# Patient Record
Sex: Female | Born: 1980 | Race: White | Hispanic: No | Marital: Single | Smoking: Current every day smoker
Health system: Southern US, Community
[De-identification: ages and names within clinical notes are randomized; demographics above are authoritative.]

## PROBLEM LIST (undated history)

## (undated) DIAGNOSIS — F319 Bipolar disorder, unspecified: Secondary | ICD-10-CM

## (undated) DIAGNOSIS — R569 Unspecified convulsions: Secondary | ICD-10-CM

## (undated) DIAGNOSIS — K5792 Diverticulitis of intestine, part unspecified, without perforation or abscess without bleeding: Secondary | ICD-10-CM

## (undated) DIAGNOSIS — M549 Dorsalgia, unspecified: Secondary | ICD-10-CM

## (undated) DIAGNOSIS — M5430 Sciatica, unspecified side: Secondary | ICD-10-CM

## (undated) DIAGNOSIS — K859 Acute pancreatitis without necrosis or infection, unspecified: Secondary | ICD-10-CM

## (undated) DIAGNOSIS — N289 Disorder of kidney and ureter, unspecified: Secondary | ICD-10-CM

## (undated) DIAGNOSIS — G8929 Other chronic pain: Secondary | ICD-10-CM

## (undated) DIAGNOSIS — D649 Anemia, unspecified: Secondary | ICD-10-CM

## (undated) DIAGNOSIS — F1910 Other psychoactive substance abuse, uncomplicated: Secondary | ICD-10-CM

## (undated) HISTORY — PX: ABDOMINAL HYSTERECTOMY: SHX81

## (undated) HISTORY — PX: ABDOMINAL SURGERY: SHX537

## (undated) HISTORY — PX: DILATION AND CURETTAGE OF UTERUS: SHX78

## (undated) HISTORY — PX: CHOLECYSTECTOMY: SHX55

## (undated) HISTORY — PX: MANDIBLE SURGERY: SHX707

---
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
## 2002-04-21 ENCOUNTER — Ambulatory Visit (HOSPITAL_COMMUNITY): Admission: RE | Admit: 2002-04-21 | Discharge: 2002-04-21 | Payer: Self-pay | Admitting: *Deleted

## 2002-05-21 ENCOUNTER — Emergency Department (HOSPITAL_COMMUNITY): Admission: EM | Admit: 2002-05-21 | Discharge: 2002-05-21 | Payer: Self-pay | Admitting: *Deleted

## 2002-05-21 ENCOUNTER — Encounter: Payer: Self-pay | Admitting: *Deleted

## 2002-05-31 ENCOUNTER — Emergency Department (HOSPITAL_COMMUNITY): Admission: EM | Admit: 2002-05-31 | Discharge: 2002-05-31 | Payer: Self-pay | Admitting: *Deleted

## 2002-05-31 ENCOUNTER — Encounter: Payer: Self-pay | Admitting: *Deleted

## 2002-06-19 ENCOUNTER — Emergency Department (HOSPITAL_COMMUNITY): Admission: EM | Admit: 2002-06-19 | Discharge: 2002-06-20 | Payer: Self-pay | Admitting: *Deleted

## 2002-06-28 ENCOUNTER — Encounter: Payer: Self-pay | Admitting: Emergency Medicine

## 2002-06-28 ENCOUNTER — Emergency Department (HOSPITAL_COMMUNITY): Admission: EM | Admit: 2002-06-28 | Discharge: 2002-06-28 | Payer: Self-pay | Admitting: Emergency Medicine

## 2002-07-24 ENCOUNTER — Emergency Department (HOSPITAL_COMMUNITY): Admission: EM | Admit: 2002-07-24 | Discharge: 2002-07-24 | Payer: Self-pay | Admitting: *Deleted

## 2002-08-02 ENCOUNTER — Ambulatory Visit (HOSPITAL_COMMUNITY): Admission: RE | Admit: 2002-08-02 | Discharge: 2002-08-02 | Payer: Self-pay | Admitting: *Deleted

## 2002-08-02 ENCOUNTER — Encounter: Payer: Self-pay | Admitting: *Deleted

## 2002-08-12 ENCOUNTER — Emergency Department (HOSPITAL_COMMUNITY): Admission: EM | Admit: 2002-08-12 | Discharge: 2002-08-12 | Payer: Self-pay | Admitting: Emergency Medicine

## 2002-08-16 ENCOUNTER — Inpatient Hospital Stay (HOSPITAL_COMMUNITY): Admission: AD | Admit: 2002-08-16 | Discharge: 2002-08-17 | Payer: Self-pay | Admitting: *Deleted

## 2002-11-19 ENCOUNTER — Emergency Department (HOSPITAL_COMMUNITY): Admission: EM | Admit: 2002-11-19 | Discharge: 2002-11-19 | Payer: Self-pay | Admitting: *Deleted

## 2003-01-12 ENCOUNTER — Emergency Department (HOSPITAL_COMMUNITY): Admission: EM | Admit: 2003-01-12 | Discharge: 2003-01-12 | Payer: Self-pay | Admitting: Emergency Medicine

## 2003-01-18 ENCOUNTER — Emergency Department (HOSPITAL_COMMUNITY): Admission: EM | Admit: 2003-01-18 | Discharge: 2003-01-18 | Payer: Self-pay | Admitting: Emergency Medicine

## 2003-01-18 ENCOUNTER — Encounter: Payer: Self-pay | Admitting: Emergency Medicine

## 2003-02-14 ENCOUNTER — Emergency Department (HOSPITAL_COMMUNITY): Admission: EM | Admit: 2003-02-14 | Discharge: 2003-02-14 | Payer: Self-pay | Admitting: Emergency Medicine

## 2003-04-26 ENCOUNTER — Ambulatory Visit (HOSPITAL_COMMUNITY): Admission: AD | Admit: 2003-04-26 | Discharge: 2003-04-27 | Payer: Self-pay | Admitting: Obstetrics and Gynecology

## 2003-05-05 ENCOUNTER — Ambulatory Visit (HOSPITAL_COMMUNITY): Admission: AD | Admit: 2003-05-05 | Discharge: 2003-05-05 | Payer: Self-pay | Admitting: Obstetrics and Gynecology

## 2003-07-16 ENCOUNTER — Ambulatory Visit (HOSPITAL_COMMUNITY): Admission: AD | Admit: 2003-07-16 | Discharge: 2003-07-16 | Payer: Self-pay | Admitting: Obstetrics and Gynecology

## 2003-08-21 ENCOUNTER — Ambulatory Visit (HOSPITAL_COMMUNITY): Admission: AD | Admit: 2003-08-21 | Discharge: 2003-08-21 | Payer: Self-pay | Admitting: Obstetrics and Gynecology

## 2003-08-28 ENCOUNTER — Ambulatory Visit (HOSPITAL_COMMUNITY): Admission: AD | Admit: 2003-08-28 | Discharge: 2003-08-28 | Payer: Self-pay | Admitting: Obstetrics and Gynecology

## 2003-09-03 ENCOUNTER — Inpatient Hospital Stay (HOSPITAL_COMMUNITY): Admission: RE | Admit: 2003-09-03 | Discharge: 2003-09-05 | Payer: Self-pay | Admitting: Obstetrics & Gynecology

## 2003-11-01 ENCOUNTER — Ambulatory Visit (HOSPITAL_COMMUNITY): Admission: RE | Admit: 2003-11-01 | Discharge: 2003-11-01 | Payer: Self-pay | Admitting: General Surgery

## 2004-02-06 ENCOUNTER — Other Ambulatory Visit: Admission: RE | Admit: 2004-02-06 | Discharge: 2004-02-06 | Payer: Self-pay | Admitting: Obstetrics and Gynecology

## 2004-03-27 ENCOUNTER — Emergency Department (HOSPITAL_COMMUNITY): Admission: EM | Admit: 2004-03-27 | Discharge: 2004-03-27 | Payer: Self-pay | Admitting: *Deleted

## 2004-03-30 ENCOUNTER — Emergency Department (HOSPITAL_COMMUNITY): Admission: EM | Admit: 2004-03-30 | Discharge: 2004-03-30 | Payer: Self-pay | Admitting: Emergency Medicine

## 2004-04-05 ENCOUNTER — Emergency Department (HOSPITAL_COMMUNITY): Admission: EM | Admit: 2004-04-05 | Discharge: 2004-04-05 | Payer: Self-pay | Admitting: Emergency Medicine

## 2004-04-22 ENCOUNTER — Encounter (HOSPITAL_COMMUNITY): Admission: RE | Admit: 2004-04-22 | Discharge: 2004-05-22 | Payer: Self-pay | Admitting: Obstetrics and Gynecology

## 2004-06-02 ENCOUNTER — Emergency Department (HOSPITAL_COMMUNITY): Admission: EM | Admit: 2004-06-02 | Discharge: 2004-06-02 | Payer: Self-pay | Admitting: Emergency Medicine

## 2004-06-04 ENCOUNTER — Emergency Department (HOSPITAL_COMMUNITY): Admission: EM | Admit: 2004-06-04 | Discharge: 2004-06-05 | Payer: Self-pay | Admitting: *Deleted

## 2004-06-04 ENCOUNTER — Emergency Department (HOSPITAL_COMMUNITY): Admission: EM | Admit: 2004-06-04 | Discharge: 2004-06-04 | Payer: Self-pay | Admitting: Emergency Medicine

## 2004-06-07 ENCOUNTER — Emergency Department (HOSPITAL_COMMUNITY): Admission: EM | Admit: 2004-06-07 | Discharge: 2004-06-07 | Payer: Self-pay | Admitting: Emergency Medicine

## 2004-06-09 ENCOUNTER — Emergency Department (HOSPITAL_COMMUNITY): Admission: EM | Admit: 2004-06-09 | Discharge: 2004-06-10 | Payer: Self-pay | Admitting: Emergency Medicine

## 2004-06-14 ENCOUNTER — Emergency Department (HOSPITAL_COMMUNITY): Admission: EM | Admit: 2004-06-14 | Discharge: 2004-06-14 | Payer: Self-pay | Admitting: Emergency Medicine

## 2004-07-20 ENCOUNTER — Emergency Department (HOSPITAL_COMMUNITY): Admission: EM | Admit: 2004-07-20 | Discharge: 2004-07-20 | Payer: Self-pay | Admitting: Emergency Medicine

## 2004-07-24 ENCOUNTER — Emergency Department (HOSPITAL_COMMUNITY): Admission: EM | Admit: 2004-07-24 | Discharge: 2004-07-24 | Payer: Self-pay | Admitting: Emergency Medicine

## 2004-08-25 ENCOUNTER — Emergency Department (HOSPITAL_COMMUNITY): Admission: EM | Admit: 2004-08-25 | Discharge: 2004-08-25 | Payer: Self-pay | Admitting: Emergency Medicine

## 2004-10-03 ENCOUNTER — Emergency Department (HOSPITAL_COMMUNITY): Admission: EM | Admit: 2004-10-03 | Discharge: 2004-10-03 | Payer: Self-pay | Admitting: Emergency Medicine

## 2005-01-14 ENCOUNTER — Emergency Department (HOSPITAL_COMMUNITY): Admission: EM | Admit: 2005-01-14 | Discharge: 2005-01-14 | Payer: Self-pay | Admitting: Emergency Medicine

## 2005-01-20 ENCOUNTER — Emergency Department (HOSPITAL_COMMUNITY): Admission: EM | Admit: 2005-01-20 | Discharge: 2005-01-20 | Payer: Self-pay | Admitting: Emergency Medicine

## 2005-02-17 ENCOUNTER — Emergency Department (HOSPITAL_COMMUNITY): Admission: EM | Admit: 2005-02-17 | Discharge: 2005-02-17 | Payer: Self-pay | Admitting: Emergency Medicine

## 2005-04-16 ENCOUNTER — Emergency Department (HOSPITAL_COMMUNITY): Admission: EM | Admit: 2005-04-16 | Discharge: 2005-04-17 | Payer: Self-pay | Admitting: Emergency Medicine

## 2005-06-04 ENCOUNTER — Emergency Department (HOSPITAL_COMMUNITY): Admission: EM | Admit: 2005-06-04 | Discharge: 2005-06-04 | Payer: Self-pay | Admitting: Emergency Medicine

## 2005-10-26 ENCOUNTER — Emergency Department (HOSPITAL_COMMUNITY): Admission: EM | Admit: 2005-10-26 | Discharge: 2005-10-26 | Payer: Self-pay | Admitting: Emergency Medicine

## 2005-10-26 ENCOUNTER — Emergency Department (HOSPITAL_COMMUNITY): Admission: AD | Admit: 2005-10-26 | Discharge: 2005-10-26 | Payer: Self-pay | Admitting: Emergency Medicine

## 2005-11-08 ENCOUNTER — Emergency Department (HOSPITAL_COMMUNITY): Admission: EM | Admit: 2005-11-08 | Discharge: 2005-11-08 | Payer: Self-pay | Admitting: Emergency Medicine

## 2005-11-11 ENCOUNTER — Emergency Department (HOSPITAL_COMMUNITY): Admission: EM | Admit: 2005-11-11 | Discharge: 2005-11-11 | Payer: Self-pay | Admitting: Emergency Medicine

## 2005-11-16 ENCOUNTER — Emergency Department (HOSPITAL_COMMUNITY): Admission: EM | Admit: 2005-11-16 | Discharge: 2005-11-17 | Payer: Self-pay | Admitting: Emergency Medicine

## 2005-11-30 ENCOUNTER — Emergency Department (HOSPITAL_COMMUNITY): Admission: EM | Admit: 2005-11-30 | Discharge: 2005-12-01 | Payer: Self-pay | Admitting: Emergency Medicine

## 2006-02-21 ENCOUNTER — Inpatient Hospital Stay (HOSPITAL_COMMUNITY): Admission: RE | Admit: 2006-02-21 | Discharge: 2006-02-21 | Payer: Self-pay | Admitting: Obstetrics and Gynecology

## 2006-03-10 ENCOUNTER — Ambulatory Visit (HOSPITAL_COMMUNITY): Admission: AD | Admit: 2006-03-10 | Discharge: 2006-03-10 | Payer: Self-pay | Admitting: Obstetrics and Gynecology

## 2006-04-11 ENCOUNTER — Ambulatory Visit (HOSPITAL_COMMUNITY): Admission: RE | Admit: 2006-04-11 | Discharge: 2006-04-12 | Payer: Self-pay | Admitting: Obstetrics and Gynecology

## 2006-05-19 ENCOUNTER — Ambulatory Visit (HOSPITAL_COMMUNITY): Admission: RE | Admit: 2006-05-19 | Discharge: 2006-05-19 | Payer: Self-pay | Admitting: Obstetrics and Gynecology

## 2006-06-22 ENCOUNTER — Ambulatory Visit (HOSPITAL_COMMUNITY): Admission: AD | Admit: 2006-06-22 | Discharge: 2006-06-22 | Payer: Self-pay | Admitting: Obstetrics and Gynecology

## 2006-07-11 ENCOUNTER — Inpatient Hospital Stay (HOSPITAL_COMMUNITY): Admission: AD | Admit: 2006-07-11 | Discharge: 2006-07-14 | Payer: Self-pay | Admitting: Obstetrics & Gynecology

## 2006-07-17 ENCOUNTER — Inpatient Hospital Stay (HOSPITAL_COMMUNITY): Admission: EM | Admit: 2006-07-17 | Discharge: 2006-07-19 | Payer: Self-pay | Admitting: Emergency Medicine

## 2006-07-27 ENCOUNTER — Emergency Department (HOSPITAL_COMMUNITY): Admission: EM | Admit: 2006-07-27 | Discharge: 2006-07-27 | Payer: Self-pay | Admitting: Emergency Medicine

## 2006-08-06 ENCOUNTER — Ambulatory Visit (HOSPITAL_COMMUNITY): Admission: RE | Admit: 2006-08-06 | Discharge: 2006-08-06 | Payer: Self-pay | Admitting: Obstetrics & Gynecology

## 2006-08-10 ENCOUNTER — Observation Stay (HOSPITAL_COMMUNITY): Admission: RE | Admit: 2006-08-10 | Discharge: 2006-08-11 | Payer: Self-pay | Admitting: General Surgery

## 2006-08-10 ENCOUNTER — Encounter (INDEPENDENT_AMBULATORY_CARE_PROVIDER_SITE_OTHER): Payer: Self-pay | Admitting: *Deleted

## 2006-09-03 ENCOUNTER — Ambulatory Visit (HOSPITAL_COMMUNITY): Admission: RE | Admit: 2006-09-03 | Discharge: 2006-09-03 | Payer: Self-pay | Admitting: Obstetrics and Gynecology

## 2006-09-29 ENCOUNTER — Emergency Department (HOSPITAL_COMMUNITY): Admission: EM | Admit: 2006-09-29 | Discharge: 2006-09-30 | Payer: Self-pay | Admitting: Emergency Medicine

## 2006-10-21 ENCOUNTER — Emergency Department (HOSPITAL_COMMUNITY): Admission: EM | Admit: 2006-10-21 | Discharge: 2006-10-21 | Payer: Self-pay | Admitting: Emergency Medicine

## 2006-10-30 ENCOUNTER — Emergency Department (HOSPITAL_COMMUNITY): Admission: EM | Admit: 2006-10-30 | Discharge: 2006-10-30 | Payer: Self-pay | Admitting: Emergency Medicine

## 2006-12-03 ENCOUNTER — Emergency Department (HOSPITAL_COMMUNITY): Admission: EM | Admit: 2006-12-03 | Discharge: 2006-12-03 | Payer: Self-pay | Admitting: Emergency Medicine

## 2006-12-03 ENCOUNTER — Encounter: Payer: Self-pay | Admitting: Obstetrics and Gynecology

## 2007-05-24 ENCOUNTER — Emergency Department (HOSPITAL_COMMUNITY): Admission: EM | Admit: 2007-05-24 | Discharge: 2007-05-24 | Payer: Self-pay | Admitting: Emergency Medicine

## 2007-06-16 ENCOUNTER — Emergency Department (HOSPITAL_COMMUNITY): Admission: EM | Admit: 2007-06-16 | Discharge: 2007-06-16 | Payer: Self-pay | Admitting: Emergency Medicine

## 2007-06-26 ENCOUNTER — Emergency Department (HOSPITAL_COMMUNITY): Admission: EM | Admit: 2007-06-26 | Discharge: 2007-06-26 | Payer: Self-pay | Admitting: Emergency Medicine

## 2007-06-28 ENCOUNTER — Emergency Department (HOSPITAL_COMMUNITY): Admission: EM | Admit: 2007-06-28 | Discharge: 2007-06-28 | Payer: Self-pay | Admitting: Emergency Medicine

## 2007-07-01 ENCOUNTER — Emergency Department (HOSPITAL_COMMUNITY): Admission: EM | Admit: 2007-07-01 | Discharge: 2007-07-02 | Payer: Self-pay | Admitting: Emergency Medicine

## 2007-07-12 ENCOUNTER — Inpatient Hospital Stay (HOSPITAL_COMMUNITY): Admission: RE | Admit: 2007-07-12 | Discharge: 2007-07-13 | Payer: Self-pay | Admitting: Obstetrics and Gynecology

## 2007-07-12 ENCOUNTER — Encounter: Payer: Self-pay | Admitting: Obstetrics and Gynecology

## 2007-07-16 ENCOUNTER — Emergency Department (HOSPITAL_COMMUNITY): Admission: EM | Admit: 2007-07-16 | Discharge: 2007-07-16 | Payer: Self-pay | Admitting: Emergency Medicine

## 2007-07-17 ENCOUNTER — Inpatient Hospital Stay (HOSPITAL_COMMUNITY): Admission: AD | Admit: 2007-07-17 | Discharge: 2007-07-17 | Payer: Self-pay | Admitting: Obstetrics & Gynecology

## 2007-07-17 ENCOUNTER — Ambulatory Visit: Payer: Self-pay | Admitting: Obstetrics & Gynecology

## 2007-07-20 ENCOUNTER — Emergency Department (HOSPITAL_COMMUNITY): Admission: EM | Admit: 2007-07-20 | Discharge: 2007-07-20 | Payer: Self-pay | Admitting: Emergency Medicine

## 2007-08-09 ENCOUNTER — Emergency Department (HOSPITAL_COMMUNITY): Admission: EM | Admit: 2007-08-09 | Discharge: 2007-08-10 | Payer: Self-pay | Admitting: Emergency Medicine

## 2007-08-18 ENCOUNTER — Emergency Department (HOSPITAL_COMMUNITY): Admission: EM | Admit: 2007-08-18 | Discharge: 2007-08-19 | Payer: Self-pay | Admitting: Emergency Medicine

## 2007-08-21 ENCOUNTER — Emergency Department (HOSPITAL_COMMUNITY): Admission: EM | Admit: 2007-08-21 | Discharge: 2007-08-21 | Payer: Self-pay | Admitting: Emergency Medicine

## 2007-09-22 ENCOUNTER — Emergency Department (HOSPITAL_COMMUNITY): Admission: EM | Admit: 2007-09-22 | Discharge: 2007-09-22 | Payer: Self-pay | Admitting: Emergency Medicine

## 2007-11-14 ENCOUNTER — Emergency Department (HOSPITAL_COMMUNITY): Admission: EM | Admit: 2007-11-14 | Discharge: 2007-11-14 | Payer: Self-pay | Admitting: Emergency Medicine

## 2007-12-31 ENCOUNTER — Emergency Department (HOSPITAL_COMMUNITY): Admission: EM | Admit: 2007-12-31 | Discharge: 2007-12-31 | Payer: Self-pay | Admitting: Emergency Medicine

## 2008-01-19 ENCOUNTER — Emergency Department (HOSPITAL_COMMUNITY): Admission: EM | Admit: 2008-01-19 | Discharge: 2008-01-19 | Payer: Self-pay | Admitting: Emergency Medicine

## 2008-01-25 ENCOUNTER — Emergency Department (HOSPITAL_COMMUNITY): Admission: EM | Admit: 2008-01-25 | Discharge: 2008-01-26 | Payer: Self-pay | Admitting: Emergency Medicine

## 2008-02-20 ENCOUNTER — Emergency Department (HOSPITAL_COMMUNITY): Admission: EM | Admit: 2008-02-20 | Discharge: 2008-02-21 | Payer: Self-pay | Admitting: Emergency Medicine

## 2008-04-22 ENCOUNTER — Emergency Department (HOSPITAL_COMMUNITY): Admission: EM | Admit: 2008-04-22 | Discharge: 2008-04-22 | Payer: Self-pay | Admitting: Emergency Medicine

## 2008-05-15 ENCOUNTER — Emergency Department (HOSPITAL_COMMUNITY): Admission: EM | Admit: 2008-05-15 | Discharge: 2008-05-15 | Payer: Self-pay | Admitting: Family Medicine

## 2008-05-31 ENCOUNTER — Emergency Department (HOSPITAL_COMMUNITY): Admission: EM | Admit: 2008-05-31 | Discharge: 2008-05-31 | Payer: Self-pay | Admitting: Emergency Medicine

## 2008-06-09 ENCOUNTER — Emergency Department (HOSPITAL_COMMUNITY): Admission: EM | Admit: 2008-06-09 | Discharge: 2008-06-09 | Payer: Self-pay | Admitting: Emergency Medicine

## 2008-07-11 ENCOUNTER — Emergency Department (HOSPITAL_COMMUNITY): Admission: EM | Admit: 2008-07-11 | Discharge: 2008-07-11 | Payer: Self-pay | Admitting: Emergency Medicine

## 2008-09-11 ENCOUNTER — Emergency Department (HOSPITAL_COMMUNITY): Admission: EM | Admit: 2008-09-11 | Discharge: 2008-09-12 | Payer: Self-pay | Admitting: Emergency Medicine

## 2008-11-29 ENCOUNTER — Emergency Department (HOSPITAL_COMMUNITY): Admission: EM | Admit: 2008-11-29 | Discharge: 2008-11-29 | Payer: Self-pay | Admitting: Emergency Medicine

## 2008-12-02 ENCOUNTER — Emergency Department (HOSPITAL_COMMUNITY): Admission: EM | Admit: 2008-12-02 | Discharge: 2008-12-02 | Payer: Self-pay | Admitting: Emergency Medicine

## 2009-01-03 ENCOUNTER — Emergency Department (HOSPITAL_COMMUNITY): Admission: EM | Admit: 2009-01-03 | Discharge: 2009-01-03 | Payer: Self-pay | Admitting: Emergency Medicine

## 2009-01-05 ENCOUNTER — Emergency Department (HOSPITAL_COMMUNITY): Admission: EM | Admit: 2009-01-05 | Discharge: 2009-01-05 | Payer: Self-pay | Admitting: Emergency Medicine

## 2009-01-08 ENCOUNTER — Inpatient Hospital Stay (HOSPITAL_COMMUNITY): Admission: EM | Admit: 2009-01-08 | Discharge: 2009-01-10 | Payer: Self-pay | Admitting: Emergency Medicine

## 2009-01-09 ENCOUNTER — Ambulatory Visit: Payer: Self-pay | Admitting: Gastroenterology

## 2009-01-11 ENCOUNTER — Telehealth (INDEPENDENT_AMBULATORY_CARE_PROVIDER_SITE_OTHER): Payer: Self-pay | Admitting: *Deleted

## 2009-01-15 ENCOUNTER — Encounter: Payer: Self-pay | Admitting: Gastroenterology

## 2009-01-15 ENCOUNTER — Emergency Department (HOSPITAL_COMMUNITY): Admission: EM | Admit: 2009-01-15 | Discharge: 2009-01-15 | Payer: Self-pay | Admitting: Emergency Medicine

## 2009-01-25 ENCOUNTER — Emergency Department (HOSPITAL_COMMUNITY): Admission: EM | Admit: 2009-01-25 | Discharge: 2009-01-25 | Payer: Self-pay | Admitting: Emergency Medicine

## 2009-01-27 ENCOUNTER — Emergency Department (HOSPITAL_COMMUNITY): Admission: EM | Admit: 2009-01-27 | Discharge: 2009-01-28 | Payer: Self-pay | Admitting: Emergency Medicine

## 2009-01-27 ENCOUNTER — Telehealth: Payer: Self-pay | Admitting: Gastroenterology

## 2009-01-30 ENCOUNTER — Emergency Department (HOSPITAL_COMMUNITY): Admission: EM | Admit: 2009-01-30 | Discharge: 2009-01-30 | Payer: Self-pay | Admitting: Emergency Medicine

## 2009-03-22 ENCOUNTER — Emergency Department (HOSPITAL_COMMUNITY): Admission: EM | Admit: 2009-03-22 | Discharge: 2009-03-22 | Payer: Self-pay | Admitting: Emergency Medicine

## 2009-03-26 ENCOUNTER — Emergency Department (HOSPITAL_COMMUNITY): Admission: EM | Admit: 2009-03-26 | Discharge: 2009-03-26 | Payer: Self-pay | Admitting: Emergency Medicine

## 2009-07-11 ENCOUNTER — Emergency Department (HOSPITAL_COMMUNITY): Admission: EM | Admit: 2009-07-11 | Discharge: 2009-07-11 | Payer: Self-pay | Admitting: Emergency Medicine

## 2009-07-18 ENCOUNTER — Emergency Department (HOSPITAL_COMMUNITY): Admission: EM | Admit: 2009-07-18 | Discharge: 2009-07-18 | Payer: Self-pay | Admitting: Emergency Medicine

## 2009-08-10 ENCOUNTER — Emergency Department (HOSPITAL_COMMUNITY): Admission: EM | Admit: 2009-08-10 | Discharge: 2009-08-10 | Payer: Self-pay | Admitting: Emergency Medicine

## 2009-08-22 ENCOUNTER — Emergency Department (HOSPITAL_COMMUNITY): Admission: EM | Admit: 2009-08-22 | Discharge: 2009-08-22 | Payer: Self-pay | Admitting: Emergency Medicine

## 2009-08-23 ENCOUNTER — Emergency Department (HOSPITAL_COMMUNITY): Admission: EM | Admit: 2009-08-23 | Discharge: 2009-08-23 | Payer: Self-pay | Admitting: Emergency Medicine

## 2009-08-26 ENCOUNTER — Emergency Department (HOSPITAL_COMMUNITY): Admission: EM | Admit: 2009-08-26 | Discharge: 2009-08-27 | Payer: Self-pay | Admitting: Emergency Medicine

## 2009-08-28 ENCOUNTER — Emergency Department (HOSPITAL_COMMUNITY): Admission: EM | Admit: 2009-08-28 | Discharge: 2009-08-28 | Payer: Self-pay | Admitting: Emergency Medicine

## 2009-08-31 ENCOUNTER — Emergency Department (HOSPITAL_COMMUNITY): Admission: EM | Admit: 2009-08-31 | Discharge: 2009-09-01 | Payer: Self-pay | Admitting: Emergency Medicine

## 2009-09-04 ENCOUNTER — Encounter (INDEPENDENT_AMBULATORY_CARE_PROVIDER_SITE_OTHER): Payer: Self-pay | Admitting: *Deleted

## 2009-09-09 ENCOUNTER — Emergency Department (HOSPITAL_COMMUNITY): Admission: EM | Admit: 2009-09-09 | Discharge: 2009-09-09 | Payer: Self-pay | Admitting: Emergency Medicine

## 2009-09-11 ENCOUNTER — Emergency Department (HOSPITAL_COMMUNITY): Admission: EM | Admit: 2009-09-11 | Discharge: 2009-09-11 | Payer: Self-pay | Admitting: Emergency Medicine

## 2009-09-17 ENCOUNTER — Emergency Department (HOSPITAL_COMMUNITY): Admission: EM | Admit: 2009-09-17 | Discharge: 2009-09-17 | Payer: Self-pay | Admitting: Emergency Medicine

## 2009-09-22 ENCOUNTER — Emergency Department (HOSPITAL_COMMUNITY): Admission: EM | Admit: 2009-09-22 | Discharge: 2009-09-22 | Payer: Self-pay | Admitting: Emergency Medicine

## 2009-09-23 ENCOUNTER — Emergency Department (HOSPITAL_COMMUNITY): Admission: EM | Admit: 2009-09-23 | Discharge: 2009-09-23 | Payer: Self-pay | Admitting: Emergency Medicine

## 2009-09-24 ENCOUNTER — Encounter (INDEPENDENT_AMBULATORY_CARE_PROVIDER_SITE_OTHER): Payer: Self-pay | Admitting: *Deleted

## 2009-09-27 ENCOUNTER — Emergency Department (HOSPITAL_COMMUNITY): Admission: EM | Admit: 2009-09-27 | Discharge: 2009-09-27 | Payer: Self-pay | Admitting: Emergency Medicine

## 2009-09-28 ENCOUNTER — Ambulatory Visit: Payer: Self-pay | Admitting: Gastroenterology

## 2009-09-28 ENCOUNTER — Encounter (INDEPENDENT_AMBULATORY_CARE_PROVIDER_SITE_OTHER): Payer: Self-pay | Admitting: *Deleted

## 2009-09-28 ENCOUNTER — Emergency Department (HOSPITAL_COMMUNITY): Admission: EM | Admit: 2009-09-28 | Discharge: 2009-09-28 | Payer: Self-pay | Admitting: Emergency Medicine

## 2009-09-28 DIAGNOSIS — R109 Unspecified abdominal pain: Secondary | ICD-10-CM | POA: Insufficient documentation

## 2009-09-28 DIAGNOSIS — K5900 Constipation, unspecified: Secondary | ICD-10-CM | POA: Insufficient documentation

## 2009-09-28 DIAGNOSIS — IMO0002 Reserved for concepts with insufficient information to code with codable children: Secondary | ICD-10-CM

## 2009-09-28 DIAGNOSIS — R1110 Vomiting, unspecified: Secondary | ICD-10-CM

## 2009-09-30 ENCOUNTER — Observation Stay (HOSPITAL_COMMUNITY): Admission: EM | Admit: 2009-09-30 | Discharge: 2009-10-03 | Payer: Self-pay | Admitting: Emergency Medicine

## 2009-10-31 ENCOUNTER — Encounter (INDEPENDENT_AMBULATORY_CARE_PROVIDER_SITE_OTHER): Payer: Self-pay | Admitting: *Deleted

## 2009-11-07 ENCOUNTER — Emergency Department (HOSPITAL_COMMUNITY): Admission: EM | Admit: 2009-11-07 | Discharge: 2009-11-07 | Payer: Self-pay | Admitting: Emergency Medicine

## 2010-01-27 ENCOUNTER — Emergency Department (HOSPITAL_COMMUNITY): Admission: EM | Admit: 2010-01-27 | Discharge: 2010-01-27 | Payer: Self-pay | Admitting: Emergency Medicine

## 2010-02-15 ENCOUNTER — Emergency Department (HOSPITAL_COMMUNITY): Admission: EM | Admit: 2010-02-15 | Discharge: 2010-02-15 | Payer: Self-pay | Admitting: Emergency Medicine

## 2010-05-20 ENCOUNTER — Encounter: Payer: Self-pay | Admitting: Emergency Medicine

## 2010-05-20 ENCOUNTER — Inpatient Hospital Stay (HOSPITAL_COMMUNITY): Admission: EM | Admit: 2010-05-20 | Discharge: 2010-05-23 | Payer: Self-pay | Admitting: Internal Medicine

## 2010-07-21 ENCOUNTER — Encounter: Payer: Self-pay | Admitting: General Surgery

## 2010-07-21 ENCOUNTER — Encounter: Payer: Self-pay | Admitting: Gastroenterology

## 2010-07-29 NOTE — H&P (Signed)
[name], [name]             ACCOUNT NO.:  [id number]  MEDICAL RECORD NO.:  [id number]          PATIENT TYPE:  EMS  LOCATION:  ED                            FACILITY:  [facility]  PHYSICIAN:  Veronica Nephew, MD       DATE OF BIRTH:  May 20, 1981  DATE OF ADMISSION:  05/20/2010 DATE OF DISCHARGE:  LH                             HISTORY & PHYSICAL   PRIMARY CARE PHYSICIAN:  [facility].  CHIEF COMPLAINT:  Right hand abscess.  HISTORY OF PRESENT ILLNESS:  A 30 year old female with a history of bipolar disorder, a history of cellulitis, history of cervical cancer status post hysterectomy and oophorectomy, history of kidney stones, history of seizure disorder, history of dental abscess, history of mild asthma, comes in with a chief complaint of right hand abscess.  The patient reports that she has been having fevers and the abscess that has been slowly getting bigger for the past 4-5 days.  The patient reports that her highest fever has been 102.5.  The patient reports that she is having nausea and vomiting and also diarrhea at times.  The patient reports that the right hand area is warm, tender and has induration. The patient has been doing skin-popping of IV cocaine.  The patient has had history of IV drug use and has a history of cellulitis.  The patient has a PENICILLIN allergy.  The patient has been treated with clindamycin previously as well as ciprofloxacin.  The patient in the ED received VANCOMYCIN and she developed pruritus, so VANCOMYCIN is a possible allergy.  The patient receives Rocephin in the ED without any difficulty.  I spoke to Dr. Mina [name] (hand surgery), phone number [phone], who has accepted to see the patient in consultation at [facility].  PAST MEDICAL HISTORY: 1. History bipolar disorder. 2. History of cellulitis. 3. History of cervical cancer, status post hysterectomy and     oophorectomy, total hysterectomy, cervix taken out. 4.  History of kidney stones. 5. History of seizure disorder. 6. History of dental abscess. 7. History of mild asthma.  SURGICAL HISTORY: 1. History of cholecystectomy. 2. History of hysterectomy. 3. History of oophorectomy.  SOCIAL HISTORY:  Nondrinker.  The patient is a smoker.  The patient uses IV cocaine.  She does not use any other drugs per the patient.  She lives with her fiance.  The patient has an allergies to FLAGYL, hives; allergies to PENICILLIN, she reports she gets swollen; NSAIDs and a possible allergy to it looks like VANCOMYCIN as the patient developed pruritus from [facility].  HOME MEDICATIONS: 1. Neurontin 300 mg p.o. b.i.d. 2. She also takes Tegretol 300 mg p.o. b.i.d. 3. She takes an albuterol inhaler as needed.  REVIEW OF SYSTEMS:  She denies any headaches or any blurry vision.  She denies any chest pain or any shortness of breath.  She is having some nausea and vomiting.  She denies any belly pain.  She denies any burning on urination.  She reports that she is having some diarrhea.  She denies any pain in her legs.  She is alert, awake, oriented x3. VITAL SIGNS:  Temperature  98.4, blood pressure 106/64, pulse rate 76, respiratory rate 20, 100% saturation on room air. HEAD, EYES, EARS, NOSE AND THROAT:  Normocephalic, atraumatic.  Bad dentition. HEART:  S1, S2.  Regular rate, rhythm.  I do not appreciate any murmurs. LUNGS:  Clear to auscultation bilaterally.  No wheezes, no rhonchi. ABDOMEN:  Soft, nontender, nondistended.  Bowel sounds positive.  No guarding or rebound tenderness. EXTREMITIES:  The patient has no lower extremity edema.  On the dorsal aspect of the right hand the patient has a 2.5 x 2.5-cm hard, indurated abscess that is tender and red and fluctuant.  The patient also has some induration in the right antecubital fossa and some mild induration on the left wrist.  RADIOLOGICAL STUDIES:  The patient did have a chest x-ray which shows  no acute cardiopulmonary process.  LABORATORY STUDIES:  WBC count 7.4, hemoglobin 11.6, hematocrit 34.3, MCV 93.3, platelets 236, neutrophils 74.  Sodium 134, potassium 3.4, chloride 101, bicarbonate 22, BUN 4, creatinine 0.82, glucose is 82. Alcohol level less than 5.  Urine toxicology positive for cocaine x3. The patient had a urinalysis:  Specific gravity 1.025, negative leukocyte esterase, negative nitrites.  IMPRESSION AND PLAN: 12. A 30 year old female with a history of seizure disorder and     intravenous and skin-popping cocaine abuse, comes in with a right     hand abscess and also some cellulitis of the right antecubital     fossa and the left wrist.  The patient will be admitted to [facility] since the patient may need a hand surgeon.  I have     already spoken to Dr. Mina [name]. He has agreed to see the patient in     consultation.  The patient will be transferred over to [facility]     and admitted for the time being to [facility] Team 8 until the     patient is redistributed to a floor team.  It looks like the patient     has had an allergy to [facility].  The patient has been able to     tolerate ceftriaxone without difficulty.  The patient has already     had blood cultures x2.  I will place the patient on clindamycin as     well as ceftazidime for methicillin-resistant Staphylococcus aureus     and pseudomonal coverage.  The patient is n.p.o. right now except     for medications and the patient will most likely have an incision     and drainage performed by Dr. Mina [name] today on May 20, 2010.     The patient will need further at least 10-day course of antibiotics     for the cellulitis after incision and drainage of abscess most     likely. 2. History of seizure disorder.  The patient will be continued on     Neurontin as well as Tegretol. 3. Cocaine abuse.  The patient will be counseled. 4. Tobacco abuse.  The patient will be counseled. 5. Bipolar  disorder.  The patient's bipolar disorder appears to be     stable.  She is not taking any medications at this point. 6. Deep vein thrombosis prophylaxis.  The patient will be placed on     sequential compression devices.  Will also get a PT/INR for the     patient. 7. The patient is a full code.     Veronica Nephew, MD     NH/MEDQ  D:  05/20/2010  T:  05/20/2010  Job:  [id number]  Electronically Signed by Veronica Nephew MD on 05/20/2010 02:40:58 PM

## 2010-07-30 NOTE — Letter (Signed)
Summary: Unable to Reach, Consult Scheduled  [facility]  [address]   [place], [place] [address]   Phone: [phone]  Fax: [phone]    09/28/2009  [name] [address] [place], [place]  [address] [birth year]   Dear Ms. Meine,   Your Pre-Operative appointment is scheduled for October 22, 2009 at 10:00 a.m. You will need to register at [facility] for this appointment. If you are unable to keep this appointment, or if you have any questions,  please call our office at [phone].     Thank you,    Ave Filter  [facility] R. Roetta Sessions, M.D.    Jonette Eva, M.D. Lorenza Burton, FNP-BC    Tana Coast, PA-C Phone: [phone]    Fax: [phone]

## 2010-07-30 NOTE — Letter (Signed)
Summary: Appointment Reminder  [facility]  [address]   [place], [place] [address]   Phone: [phone]  Fax: [phone]       September 04, 2009   [name] [address] [place], [place]  [address] 20-Mar-1981    Dear [name],  We have been unable to reach you by phone to schedule a follow up   appointment that was recommended for you by Dr. Darrick Penna. It is very   important that we reach you to schedule an appointment. We hope that you  allow [place] to participate in your health care needs. Please contact us at  [phone] at your earliest convenience to schedule your appointment.  Sincerely,    [facility] R. Roetta Sessions, M.D.    Kassie Mends, M.D. Lorenza Burton, FNP-BC    Tana Coast, PA-C Phone: [phone]    Fax: [phone]

## 2010-07-30 NOTE — Letter (Signed)
Summary: GES ORDER  GES ORDER   Imported By: Ave Filter 09/28/2009 09:50:58  _____________________________________________________________________  External Attachment:    Type:   Image     Comment:   External Document  Appended Document: GES ORDER Pt No Showed for her Gastric Emptying Study..I called pt and lm with a family member to give me a call to try and reschedule.  Appended Document: GES ORDER Pt will call to [facility].

## 2010-07-30 NOTE — Letter (Signed)
Summary: Recall Office Visit  [facility]  [address]   [place], [place] [address]   Phone: [phone]  Fax: [phone]      Oct 31, 2009   SHRINIKA BLATZ [address] [place], [place]  [address] 1981/04/29   Dear Ms. Langdon,   According to our records, it is time for you to schedule a follow-up office visit with [place].   At your convenience, please call [phone] to schedule an office visit. If you have any questions, concerns, or feel that this letter is in error, we would appreciate your call.   Sincerely,    Diana Eves  [facility] Ph: [phone]   Fax: [phone]

## 2010-07-30 NOTE — Assessment & Plan Note (Signed)
Summary: BLOOD IN STOOL, VOMITING, Left wrist cellulitis   Visit Type:  Follow-up Visit Primary Care Provider:  None  Chief Complaint:  ER follow-up.  History of Present Illness: tired, hungry, weak, and stomach pain. Feels Sx worse. past month it won't eat. Taking Neurontin, INH, and Tegretol. After she eats it's sharp. Otherwise it's dull. In upr abd, in lwr abd. Nausea: multiple times a day and vomting: most of the time after she eats or drinks. Temp: 102.F off and on for past couple of weeks. Bms: 1x/month-comes out hard. Took Ex-Lax, Miralax, and anything OTC. Blood in stool: once a month-"a whole lot", in bowl and when she wipes. No problems swallowing. Denies heartburn or indigestion. No narcotics. Etoh: 1-2x/week. No ASA, BC, Goody's, Ibuprofen, Motrin, or Aleve. Occasionally: cocaine-1-2x/q1mos. No heroin.  Had diarrhea for 3 days but now it's gone.  Preventive Screening-Counseling & Management  Alcohol-Tobacco     Smoking Status: current  Current Medications (verified): 1)  Neurontin 300 Mg Caps (Gabapentin) .... Take 1 Tablet By Mouth Two Times A Day 2)  Tegretol Xr 100 Mg Xr12h-Tab (Carbamazepine) .... Take 1 Tablet By Mouth Two Times A Day  Allergies (verified): 1)  ! Penicillin 2)  ! Flagyl 3)  ! Darvocet 4)  ! Ibuprofen 5)  ! Nsaids 6)  ! 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 Mom helps with kids. Pt has no insurance. Smoking Status:  current  Review of Systems  The patient denies chest pain.         No SOB, dysuria, or hematuria.  Vital Signs:  Patient profile:   30 year old  female Height:      67 inches Weight:      128.50 pounds BMI:     20.20 Temp:     98.4 degrees F oral Pulse rate:   96 / minute BP sitting:   124 / 80  (right arm) Cuff size:   regular  Vitals Entered By: Cloria Spring LPN (September 28, 1608 8:34 AM)  Physical Exam  General:  Well developed, well nourished, no acute distress. Head:  Normocephalic and atraumatic. Eyes:  PERRLA, no icterus. Mouth:  No deformity or lesions, dentition poor. Neck:  Supple; no masses. Lungs:  Clear throughout to auscultation. Heart:  Regular rate and rhythm; no murmurs, rubs,  or bruits. Abdomen:  Soft, mild TTP in epigastrium, nondistended. Normal bowel sounds. Extremities:  No edema noted. Erythema of left wrist with edema. Neurologic:  Alert and  oriented x4;  grossly normal neurologically.  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 Add Amitiza 24 micrograms at bedtime then two times a day. Get labs drawn. Will schedule endoscopy. Will need a Urine drug screen today and the the day before the endo due to history of recreational drug use. Return visit in 6 weeks.  . Orders: T-TSH [id number]) T-Drug Screen-Urine, ea (mullti) [phone]) T-Cortisol, AM [phone]) Est. Patient Level V ([address])  Problem # 2:  VOMITING (ICD-787.03) 2o to uncontrolled GERD or gastroparesis.  Follow a gastroparesis diet. Complete gastric emptying study. See a Dentist. Bad teeth can contribute to nausea/vomiting. Take  Nexium two times a day until samples are used up. Then use omeprazole two times a day.  Orders: T-TSH [phone]) T-Drug Screen-Urine, ea (mullti) [phone]) T-Cortisol, AM [phone]) Est. Patient Level V [phone])  Problem # 3:  CELLULITIS, ARM (ICD-682.3) Assessment: New Received Vancomycin and ROCEPHIN in ED. See the Health DEPT for lesion on left wrist.  CC: PCP  Patient Instructions: 1)  Follow a gastroparesis diet. 2)   Complete gastric emptying study. 3)  See a Dentist. Bad teeth can contribute to nausea/vomiting. 4)  Take Nexium two times a day until samples are used up.  5)  Then use omeprazole two times a day. 6)  Add Amitiza 24 micrograms at bedtime then two times a day. 7)  Get labs drawn. 8)  See the Health DEPT for lesion on left wrist. 9)  Will schedule endoscopy.  10)  You need a Urine drug screen the day before the endo. 11)  Return visit in 6 weeks. 12)  The medication list was reviewed and reconciled.  All changed / newly prescribed medications were explained.  A complete medication list was provided to the patient / caregiver. Prescriptions: AMITIZA 24 MCG CAPS (LUBIPROSTONE) 1 by mouth BID  #60 x 5   Entered and Authorized by:   West Bali MD   Signed by:   West Bali MD on 09/28/2009   Method used:   Electronically to        [facility]  [address] (retail)       [address]       [place], [place]  [address]       Ph: [phone]       Fax: [phone]   RxID:   [id number] OMEPRAZOLE 20 MG CPDR (OMEPRAZOLE) 1 by mouth 30 minutes prior to first and last meal  #60 x 5   Entered and Authorized by:   West Bali MD   Signed by:   West Bali MD on 09/28/2009   Method used:   Electronically to        [facility]  [address] (retail)       [address]       [place], [place]  [address]       Ph: [phone]       Fax: [phone]   RxID:   [id number]

## 2010-07-30 NOTE — Letter (Signed)
Summary: Appointment Reminder  [facility]  [address]   [place], [place] [address]   Phone: [phone]  Fax: [phone]       September 24, 2009   [name] [address] [place], [place]  [address] March 11, 1981    Dear Ms. Desilva,  We have been unable to reach you by phone to schedule a follow up   appointment that was recommended for you by Dr. Darrick Penna. It is very   important that we reach you to schedule an appointment. We hope that you  allow [place] to participate in your health care needs. Please contact us at  [phone] at your earliest convenience to schedule your appointment.  Sincerely,    [facility] R. Roetta Sessions, M.D.    Jonette Eva, M.D. Lorenza Burton, FNP-BC    Tana Coast, PA-C Phone: [phone]    Fax: [phone]

## 2010-07-30 NOTE — Letter (Signed)
Summary: TCS/EGD ORDER  TCS/EGD ORDER   Imported By: Ave Filter 09/28/2009 09:52:05  _____________________________________________________________________  External Attachment:    Type:   Image     Comment:   External Document  Appended Document: TCS/EGD ORDER Pt NO SHOWED for her pre op visit..I called to see if the pt wanted to reschedule and was told that they would "try to find her and have her call me back"

## 2010-09-10 LAB — LIPID PANEL
LDL Cholesterol: 73 mg/dL (ref 0–99)
Triglycerides: 54 mg/dL (ref <150)
VLDL: 11 mg/dL (ref 0–40)

## 2010-09-10 LAB — RAPID URINE DRUG SCREEN, HOSP PERFORMED
Amphetamines: NOT DETECTED
Barbiturates: NOT DETECTED
Benzodiazepines: NOT DETECTED
Cocaine: POSITIVE — AB
Opiates: NOT DETECTED

## 2010-09-10 LAB — CBC
Hemoglobin: 10.6 g/dL — ABNORMAL LOW (ref 12.0–15.0)
MCV: 93.3 fL (ref 78.0–100.0)
Platelets: 236 10*3/uL (ref 150–400)
RBC: 3.48 MIL/uL — ABNORMAL LOW (ref 3.87–5.11)
RDW: 14 % (ref 11.5–15.5)
WBC: 7.4 10*3/uL (ref 4.0–10.5)

## 2010-09-10 LAB — BASIC METABOLIC PANEL
BUN: 4 mg/dL — ABNORMAL LOW (ref 6–23)
Calcium: 9.3 mg/dL (ref 8.4–10.5)
Chloride: 101 mEq/L (ref 96–112)
Creatinine, Ser: 0.82 mg/dL (ref 0.4–1.2)
GFR calc Af Amer: >60 mL/min (ref >60)
GFR calc non Af Amer: >60 mL/min (ref >60)

## 2010-09-10 LAB — ETHANOL: Alcohol, Ethyl (B): <5 mg/dL (ref 0–10)

## 2010-09-10 LAB — URINALYSIS, ROUTINE W REFLEX MICROSCOPIC
Glucose, UA: NEGATIVE mg/dL
Hgb urine dipstick: NEGATIVE
pH: 6 (ref 5.0–8.0)

## 2010-09-10 LAB — COMPREHENSIVE METABOLIC PANEL
ALT: 16 U/L (ref 0–35)
AST: 28 U/L (ref 0–37)
CO2: 24 mEq/L (ref 19–32)
Calcium: 8.2 mg/dL — ABNORMAL LOW (ref 8.4–10.5)
Chloride: 105 mEq/L (ref 96–112)
GFR calc Af Amer: >60 mL/min (ref >60)
GFR calc non Af Amer: >60 mL/min (ref >60)
Sodium: 134 mEq/L — ABNORMAL LOW (ref 135–145)
Total Bilirubin: 0.3 mg/dL (ref 0.3–1.2)

## 2010-09-10 LAB — PHOSPHORUS: Phosphorus: 3.8 mg/dL (ref 2.3–4.6)

## 2010-09-10 LAB — DIFFERENTIAL
Basophils Absolute: 0 10*3/uL (ref 0.0–0.1)
Eosinophils Absolute: 0.1 10*3/uL (ref 0.0–0.7)
Eosinophils Relative: 1 % (ref 0–5)
Eosinophils Relative: 1 % (ref 0–5)
Lymphocytes Relative: 20 % (ref 12–46)
Lymphs Abs: 1.7 10*3/uL (ref 0.7–4.0)
Neutrophils Relative %: 74 % (ref 43–77)

## 2010-09-10 LAB — CULTURE, BLOOD (ROUTINE X 2): Culture: NO GROWTH

## 2010-09-10 LAB — URINE CULTURE

## 2010-09-10 LAB — ANAEROBIC CULTURE

## 2010-09-10 LAB — CARBAMAZEPINE LEVEL, TOTAL: Carbamazepine Lvl: <2 ug/mL — ABNORMAL LOW (ref 4.0–12.0)

## 2010-09-10 LAB — WOUND CULTURE

## 2010-09-10 LAB — GRAM STAIN

## 2010-09-10 LAB — MAGNESIUM: Magnesium: 1.9 mg/dL (ref 1.5–2.5)

## 2010-09-10 LAB — PROTIME-INR: Prothrombin Time: 13.2 seconds (ref 11.6–15.2)

## 2010-09-14 LAB — URINALYSIS, ROUTINE W REFLEX MICROSCOPIC
Bilirubin Urine: NEGATIVE
Glucose, UA: NEGATIVE mg/dL
Hgb urine dipstick: NEGATIVE
Ketones, ur: NEGATIVE mg/dL
Nitrite: NEGATIVE
Protein, ur: NEGATIVE mg/dL
Specific Gravity, Urine: 1.025 (ref 1.005–1.030)
Urobilinogen, UA: 0.2 mg/dL (ref 0.0–1.0)
pH: 6.5 (ref 5.0–8.0)

## 2010-09-14 LAB — URINE MICROSCOPIC-ADD ON

## 2010-09-17 LAB — URINALYSIS, ROUTINE W REFLEX MICROSCOPIC
Bilirubin Urine: NEGATIVE
Hgb urine dipstick: NEGATIVE
Ketones, ur: NEGATIVE mg/dL
Nitrite: NEGATIVE
pH: 6 (ref 5.0–8.0)

## 2010-09-18 LAB — DIFFERENTIAL
Basophils Absolute: 0 10*3/uL (ref 0.0–0.1)
Basophils Relative: 1 % (ref 0–1)
Basophils Relative: 1 % (ref 0–1)
Eosinophils Absolute: 0.1 10*3/uL (ref 0.0–0.7)
Eosinophils Absolute: 0.2 10*3/uL (ref 0.0–0.7)
Eosinophils Relative: 3 % (ref 0–5)
Lymphocytes Relative: 29 % (ref 12–46)
Lymphs Abs: 2.8 10*3/uL (ref 0.7–4.0)
Lymphs Abs: 2.4 10*3/uL (ref 0.7–4.0)
Lymphs Abs: 2.5 10*3/uL (ref 0.7–4.0)
Monocytes Absolute: 0.2 10*3/uL (ref 0.1–1.0)
Monocytes Absolute: 0.4 10*3/uL (ref 0.1–1.0)
Monocytes Relative: 5 % (ref 3–12)
Monocytes Relative: 5 % (ref 3–12)
Neutro Abs: 4.1 10*3/uL (ref 1.7–7.7)
Neutro Abs: 3.4 10*3/uL (ref 1.7–7.7)
Neutro Abs: 5.5 10*3/uL (ref 1.7–7.7)
Neutrophils Relative %: 56 % (ref 43–77)
Neutrophils Relative %: 53 % (ref 43–77)
Neutrophils Relative %: 65 % (ref 43–77)

## 2010-09-18 LAB — BASIC METABOLIC PANEL
BUN: 4 mg/dL — ABNORMAL LOW (ref 6–23)
BUN: 7 mg/dL (ref 6–23)
BUN: 7 mg/dL (ref 6–23)
CO2: 24 mEq/L (ref 19–32)
CO2: 28 mEq/L (ref 19–32)
Calcium: 9.7 mg/dL (ref 8.4–10.5)
Chloride: 106 mEq/L (ref 96–112)
Creatinine, Ser: 0.78 mg/dL (ref 0.4–1.2)
Creatinine, Ser: 0.83 mg/dL (ref 0.4–1.2)
GFR calc Af Amer: >60 mL/min (ref >60)
GFR calc non Af Amer: >60 mL/min (ref >60)
Glucose, Bld: 127 mg/dL — ABNORMAL HIGH (ref 70–99)
Glucose, Bld: 86 mg/dL (ref 70–99)
Potassium: 4.1 mEq/L (ref 3.5–5.1)
Sodium: 137 mEq/L (ref 135–145)

## 2010-09-18 LAB — URINE MICROSCOPIC-ADD ON

## 2010-09-18 LAB — URINALYSIS, ROUTINE W REFLEX MICROSCOPIC
Bilirubin Urine: NEGATIVE
Glucose, UA: NEGATIVE mg/dL
Glucose, UA: NEGATIVE mg/dL
Glucose, UA: NEGATIVE mg/dL
Hgb urine dipstick: NEGATIVE
Ketones, ur: NEGATIVE mg/dL
Ketones, ur: NEGATIVE mg/dL
Leukocytes, UA: NEGATIVE
Nitrite: NEGATIVE
Protein, ur: NEGATIVE mg/dL
Protein, ur: NEGATIVE mg/dL
Protein, ur: NEGATIVE mg/dL
Specific Gravity, Urine: <1.005 — ABNORMAL LOW (ref 1.005–1.030)
pH: 5.5 (ref 5.0–8.0)
pH: 6.5 (ref 5.0–8.0)
pH: 7 (ref 5.0–8.0)

## 2010-09-18 LAB — CULTURE, BLOOD (ROUTINE X 2): Culture: NO GROWTH

## 2010-09-18 LAB — CBC
HCT: 34.8 % — ABNORMAL LOW (ref 36.0–46.0)
HCT: 36.2 % (ref 36.0–46.0)
Hemoglobin: 12 g/dL (ref 12.0–15.0)
Hemoglobin: 12.1 g/dL (ref 12.0–15.0)
Hemoglobin: 12.8 g/dL (ref 12.0–15.0)
MCHC: 34.4 g/dL (ref 30.0–36.0)
MCHC: 34.7 g/dL (ref 30.0–36.0)
MCHC: 35.2 g/dL (ref 30.0–36.0)
MCV: 92.6 fL (ref 78.0–100.0)
MCV: 92.7 fL (ref 78.0–100.0)
Platelets: 211 10*3/uL (ref 150–400)
Platelets: 212 10*3/uL (ref 150–400)
RBC: 4.24 MIL/uL (ref 3.87–5.11)
RBC: 3.73 MIL/uL — ABNORMAL LOW (ref 3.87–5.11)
RBC: 3.91 MIL/uL (ref 3.87–5.11)
RDW: 14.7 % (ref 11.5–15.5)
RDW: 15.1 % (ref 11.5–15.5)
WBC: 7.4 10*3/uL (ref 4.0–10.5)
WBC: 8.5 10*3/uL (ref 4.0–10.5)

## 2010-09-18 LAB — PREGNANCY, URINE: Preg Test, Ur: NEGATIVE

## 2010-09-18 LAB — COMPREHENSIVE METABOLIC PANEL
ALT: 12 U/L (ref 0–35)
BUN: 8 mg/dL (ref 6–23)
CO2: 25 mEq/L (ref 19–32)
Calcium: 9.1 mg/dL (ref 8.4–10.5)
Creatinine, Ser: 0.87 mg/dL (ref 0.4–1.2)
GFR calc non Af Amer: >60 mL/min (ref >60)
Glucose, Bld: 105 mg/dL — ABNORMAL HIGH (ref 70–99)
Sodium: 135 mEq/L (ref 135–145)
Total Protein: 6.7 g/dL (ref 6.0–8.3)

## 2010-09-18 LAB — URINE CULTURE: Colony Count: 3000

## 2010-09-18 LAB — RAPID URINE DRUG SCREEN, HOSP PERFORMED
Benzodiazepines: NOT DETECTED
Cocaine: POSITIVE — AB
Tetrahydrocannabinol: NOT DETECTED

## 2010-09-18 LAB — CARBAMAZEPINE LEVEL, TOTAL: Carbamazepine Lvl: <2 ug/mL — ABNORMAL LOW (ref 4.0–12.0)

## 2010-09-18 LAB — HEPATITIS C ANTIBODY: HCV Ab: NEGATIVE

## 2010-09-20 LAB — URINALYSIS, ROUTINE W REFLEX MICROSCOPIC
Bilirubin Urine: NEGATIVE
Bilirubin Urine: NEGATIVE
Bilirubin Urine: NEGATIVE
Glucose, UA: NEGATIVE mg/dL
Glucose, UA: NEGATIVE mg/dL
Glucose, UA: NEGATIVE mg/dL
Hgb urine dipstick: NEGATIVE
Hgb urine dipstick: NEGATIVE
Hgb urine dipstick: NEGATIVE
Ketones, ur: NEGATIVE mg/dL
Nitrite: NEGATIVE
Nitrite: NEGATIVE
Protein, ur: NEGATIVE mg/dL
Specific Gravity, Urine: <1.005 — ABNORMAL LOW (ref 1.005–1.030)
Specific Gravity, Urine: <1.005 — ABNORMAL LOW (ref 1.005–1.030)
pH: 5.5 (ref 5.0–8.0)
pH: 6 (ref 5.0–8.0)
pH: 7 (ref 5.0–8.0)

## 2010-09-20 LAB — CBC
HCT: 40.6 % (ref 36.0–46.0)
Hemoglobin: 12.3 g/dL (ref 12.0–15.0)
Hemoglobin: 13.9 g/dL (ref 12.0–15.0)
MCHC: 34.2 g/dL (ref 30.0–36.0)
RBC: 3.85 MIL/uL — ABNORMAL LOW (ref 3.87–5.11)
WBC: 6.9 10*3/uL (ref 4.0–10.5)

## 2010-09-20 LAB — DIFFERENTIAL
Basophils Relative: 0 % (ref 0–1)
Eosinophils Relative: 2 % (ref 0–5)
Lymphocytes Relative: 41 % (ref 12–46)
Lymphs Abs: 1.8 10*3/uL (ref 0.7–4.0)
Lymphs Abs: 2.8 10*3/uL (ref 0.7–4.0)
Monocytes Absolute: 0.2 10*3/uL (ref 0.1–1.0)
Monocytes Absolute: 0.3 10*3/uL (ref 0.1–1.0)
Monocytes Relative: 4 % (ref 3–12)
Neutro Abs: 3.6 10*3/uL (ref 1.7–7.7)

## 2010-09-20 LAB — CULTURE, BLOOD (ROUTINE X 2)
Culture: NO GROWTH
Culture: NO GROWTH
Report Status: 4052011

## 2010-09-20 LAB — BASIC METABOLIC PANEL
Chloride: 105 mEq/L (ref 96–112)
GFR calc non Af Amer: >60 mL/min (ref >60)
Glucose, Bld: 92 mg/dL (ref 70–99)
Potassium: 4.1 mEq/L (ref 3.5–5.1)
Sodium: 140 mEq/L (ref 135–145)

## 2010-09-20 LAB — HEPATIC FUNCTION PANEL
ALT: 11 U/L (ref 0–35)
Alkaline Phosphatase: 70 U/L (ref 39–117)
Bilirubin, Direct: 0 mg/dL (ref 0.0–0.3)
Indirect Bilirubin: 0.5 mg/dL (ref 0.3–0.9)
Total Bilirubin: 0.5 mg/dL (ref 0.3–1.2)

## 2010-09-20 LAB — PREGNANCY, URINE: Preg Test, Ur: NEGATIVE

## 2010-10-06 LAB — DIFFERENTIAL
Basophils Absolute: 0 10*3/uL (ref 0.0–0.1)
Basophils Relative: 0 % (ref 0–1)
Basophils Relative: 1 % (ref 0–1)
Basophils Relative: 1 % (ref 0–1)
Eosinophils Absolute: 0.1 10*3/uL (ref 0.0–0.7)
Eosinophils Absolute: 0.2 10*3/uL (ref 0.0–0.7)
Eosinophils Relative: 1 % (ref 0–5)
Eosinophils Relative: 1 % (ref 0–5)
Eosinophils Relative: 4 % (ref 0–5)
Lymphocytes Relative: 24 % (ref 12–46)
Lymphocytes Relative: 27 % (ref 12–46)
Lymphs Abs: 2.3 10*3/uL (ref 0.7–4.0)
Monocytes Absolute: 0.3 10*3/uL (ref 0.1–1.0)
Monocytes Absolute: 0.3 10*3/uL (ref 0.1–1.0)
Monocytes Relative: 4 % (ref 3–12)
Monocytes Relative: 5 % (ref 3–12)
Monocytes Relative: 6 % (ref 3–12)
Monocytes Relative: 7 % (ref 3–12)
Neutro Abs: 2.1 10*3/uL (ref 1.7–7.7)
Neutro Abs: 2.5 10*3/uL (ref 1.7–7.7)
Neutro Abs: 5.8 10*3/uL (ref 1.7–7.7)
Neutrophils Relative %: 47 % (ref 43–77)

## 2010-10-06 LAB — CBC
HCT: 34.2 % — ABNORMAL LOW (ref 36.0–46.0)
HCT: 34.7 % — ABNORMAL LOW (ref 36.0–46.0)
HCT: 36.1 % (ref 36.0–46.0)
Hemoglobin: 11.6 g/dL — ABNORMAL LOW (ref 12.0–15.0)
Hemoglobin: 11.8 g/dL — ABNORMAL LOW (ref 12.0–15.0)
MCHC: 33.8 g/dL (ref 30.0–36.0)
MCV: 94.1 fL (ref 78.0–100.0)
MCV: 94.6 fL (ref 78.0–100.0)
Platelets: 210 10*3/uL (ref 150–400)
RBC: 3.63 MIL/uL — ABNORMAL LOW (ref 3.87–5.11)
RBC: 3.84 MIL/uL — ABNORMAL LOW (ref 3.87–5.11)
RDW: 15.7 % — ABNORMAL HIGH (ref 11.5–15.5)
RDW: 16 % — ABNORMAL HIGH (ref 11.5–15.5)
RDW: 16.2 % — ABNORMAL HIGH (ref 11.5–15.5)
WBC: 5.2 10*3/uL (ref 4.0–10.5)
WBC: 8.4 10*3/uL (ref 4.0–10.5)

## 2010-10-06 LAB — COMPREHENSIVE METABOLIC PANEL
ALT: 13 U/L (ref 0–35)
AST: 22 U/L (ref 0–37)
Albumin: 3.3 g/dL — ABNORMAL LOW (ref 3.5–5.2)
Alkaline Phosphatase: 69 U/L (ref 39–117)
Alkaline Phosphatase: 74 U/L (ref 39–117)
BUN: 3 mg/dL — ABNORMAL LOW (ref 6–23)
Chloride: 110 mEq/L (ref 96–112)
Glucose, Bld: 97 mg/dL (ref 70–99)
Potassium: 4 mEq/L (ref 3.5–5.1)
Potassium: 4 mEq/L (ref 3.5–5.1)
Sodium: 141 mEq/L (ref 135–145)
Total Bilirubin: 0.5 mg/dL (ref 0.3–1.2)
Total Protein: 6 g/dL (ref 6.0–8.3)
Total Protein: 6.3 g/dL (ref 6.0–8.3)

## 2010-10-06 LAB — BASIC METABOLIC PANEL
CO2: 24 mEq/L (ref 19–32)
Calcium: 9.4 mg/dL (ref 8.4–10.5)
GFR calc Af Amer: >60 mL/min (ref >60)
Glucose, Bld: 92 mg/dL (ref 70–99)
Potassium: 3.8 mEq/L (ref 3.5–5.1)
Sodium: 139 mEq/L (ref 135–145)

## 2010-10-06 LAB — URINALYSIS, ROUTINE W REFLEX MICROSCOPIC
Bilirubin Urine: NEGATIVE
Bilirubin Urine: NEGATIVE
Glucose, UA: NEGATIVE mg/dL
Hgb urine dipstick: NEGATIVE
Ketones, ur: NEGATIVE mg/dL
Protein, ur: 30 mg/dL — AB
Urobilinogen, UA: 0.2 mg/dL (ref 0.0–1.0)
Urobilinogen, UA: 0.2 mg/dL (ref 0.0–1.0)

## 2010-10-06 LAB — VANCOMYCIN, TROUGH: Vancomycin Tr: 7.5 ug/mL — ABNORMAL LOW (ref 10.0–20.0)

## 2010-10-06 LAB — URINE MICROSCOPIC-ADD ON

## 2010-10-06 LAB — LIPASE, BLOOD: Lipase: 20 U/L (ref 11–59)

## 2010-10-06 LAB — RAPID URINE DRUG SCREEN, HOSP PERFORMED
Benzodiazepines: NOT DETECTED
Cocaine: POSITIVE — AB
Tetrahydrocannabinol: NOT DETECTED

## 2010-10-06 LAB — TSH: TSH: 1.091 u[IU]/mL (ref 0.350–4.500)

## 2010-10-07 LAB — WOUND CULTURE

## 2010-10-10 LAB — BASIC METABOLIC PANEL
BUN: 4 mg/dL — ABNORMAL LOW (ref 6–23)
CO2: 26 mEq/L (ref 19–32)
Chloride: 107 mEq/L (ref 96–112)
Glucose, Bld: 86 mg/dL (ref 70–99)
Potassium: 3.1 mEq/L — ABNORMAL LOW (ref 3.5–5.1)

## 2010-10-10 LAB — CBC
HCT: 33.2 % — ABNORMAL LOW (ref 36.0–46.0)
MCV: 91.3 fL (ref 78.0–100.0)
Platelets: 174 10*3/uL (ref 150–400)
RDW: 14.3 % (ref 11.5–15.5)

## 2010-10-10 LAB — DIFFERENTIAL
Basophils Absolute: 0 10*3/uL (ref 0.0–0.1)
Eosinophils Absolute: 0 10*3/uL (ref 0.0–0.7)
Eosinophils Relative: 1 % (ref 0–5)
Lymphs Abs: 0.5 10*3/uL — ABNORMAL LOW (ref 0.7–4.0)

## 2010-10-10 LAB — URINALYSIS, ROUTINE W REFLEX MICROSCOPIC
Glucose, UA: NEGATIVE mg/dL
Ketones, ur: NEGATIVE mg/dL
Leukocytes, UA: NEGATIVE
Protein, ur: NEGATIVE mg/dL

## 2010-10-14 LAB — DIFFERENTIAL
Basophils Absolute: 0 10*3/uL (ref 0.0–0.1)
Basophils Relative: 0 % (ref 0–1)
Monocytes Absolute: 0.6 10*3/uL (ref 0.1–1.0)
Neutro Abs: 8.5 10*3/uL — ABNORMAL HIGH (ref 1.7–7.7)
Neutrophils Relative %: 79 % — ABNORMAL HIGH (ref 43–77)

## 2010-10-14 LAB — CBC
MCHC: 32.8 g/dL (ref 30.0–36.0)
Platelets: 211 10*3/uL (ref 150–400)
RDW: 15.7 % — ABNORMAL HIGH (ref 11.5–15.5)

## 2010-10-14 LAB — URINE MICROSCOPIC-ADD ON

## 2010-10-14 LAB — URINALYSIS, ROUTINE W REFLEX MICROSCOPIC
Glucose, UA: NEGATIVE mg/dL
Leukocytes, UA: NEGATIVE
Specific Gravity, Urine: 1.025 (ref 1.005–1.030)
pH: 6 (ref 5.0–8.0)

## 2010-10-19 ENCOUNTER — Emergency Department (HOSPITAL_COMMUNITY): Payer: Self-pay

## 2010-10-19 ENCOUNTER — Emergency Department (HOSPITAL_COMMUNITY)
Admission: EM | Admit: 2010-10-19 | Discharge: 2010-10-19 | Disposition: A | Discharge status: Home / Self Care | Payer: Self-pay | Attending: Emergency Medicine | Admitting: Emergency Medicine

## 2010-10-19 DIAGNOSIS — W010XXA Fall on same level from slipping, tripping and stumbling without subsequent striking against object, initial encounter: Secondary | ICD-10-CM | POA: Insufficient documentation

## 2010-10-19 DIAGNOSIS — S335XXA Sprain of ligaments of lumbar spine, initial encounter: Secondary | ICD-10-CM | POA: Insufficient documentation

## 2010-11-12 NOTE — Op Note (Signed)
[name], [name]             ACCOUNT NO.:  [id number]   MEDICAL RECORD NO.:  [id number]          PATIENT TYPE:  INP   LOCATION:  A306                          FACILITY:  [facility]   PHYSICIAN:  Tilda Burrow, M.D. DATE OF BIRTH:  Feb 16, 1981   DATE OF PROCEDURE:  07/12/2007  DATE OF DISCHARGE:                               OPERATIVE REPORT   PREOPERATIVE DIAGNOSES:  1. 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  2. Dyspareunia, secondary to retroversion and enlarged left ovary.   POSTOPERATIVE DIAGNOSES:  1. 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  2. Dyspareunia, secondary to retroversion and enlarged left ovary.   PROCEDURE:  1. Vaginal hysterectomy.  2. Left salpingo-oophorectomy.   SURGEON:  Tilda Burrow, M.D.   ASSISTANTMarlinda Mike, RN.  Kendrick, CST.   ANESTHESIA:  General.   COMPLICATIONS:  None.   FINDINGS:  Uterine descensus to the introitus, with minimal traction.  Retroverted retroflexed uterus, upper limits of normal size.  Left ovary  which is draped down into the cul-de-sac, a constant source of pain to  the patient.  Scheduled for removal with the associated tube, as  described in discussions with the patient before procedure.   DETAILS OF THE PROCEDURE:  The patient was taken to the operating room;  prepped and draped for a vaginal procedure.  A weighted speculum was in  place, and the cervix grasped and retracted inferiorly to the level of  the introitus.  Bladder retractor was in position and allowed  visibility.  The cervix was circumscribed initially with Marcaine with  epinephrine solution, followed by cutting electrocautery transection  through the vaginal epithelium and circumscribing the cervix.  The  bladder flap could be pushed cephalad nicely anteriorly, and posterior  colpotomy incision easily identified at the cul-de-sac.  The uterosacral  ligaments were clamped, cut and suture ligated bilaterally, using  Zeppelin clamps,  Mayo scissors transection and 0 chromic suture  ligature.  The lower cardinal ligaments were clamped, cut and suture  ligated, maintaining close proximity to the uterus.  The remainder of  the cardinal ligament complex could be clamped, cut and suture ligated.  The peritoneum had been easily entered anteriorly by this time.  We  marched out the broad ligament, clamping, cutting and suture ligating.  On the right side the ovary showed enough support that it did not rest  in the pelvic floor.  By comparison, the left tube and ovary were draped  at the introitus.  As for the patient's previous discussions, the tube  and ovary on the left were removed.  The pedicles were inspected and  confirmed and as hemostatic.  The uterosacral ligaments were sewn into  the posterior vaginal cuff with a buried stitch of 2-0 Prolene on each  side, giving increased vaginal cuff support.  The peritoneum was closed  by running, continuous suture of 2-0 chromic.  The remainder of the cuff  was closed in an inverted Y-position, using 0 chromic sutures; with good  hemostasis and vaginal apex support.  The patient tolerated the  procedure well and went to the recovery room in good  condition.   ESTIMATED BLOOD LOSS:  100 mL.      Tilda Burrow, M.D.  Electronically Signed     JVF/MEDQ  D:  07/12/2007  T:  07/12/2007  Job:  [id number]

## 2010-11-12 NOTE — Consult Note (Signed)
NAMEMURPHY, [name]             ACCOUNT NO.:  [id number]   MEDICAL RECORD NO.:  [id number]          PATIENT TYPE:  INP   LOCATION:  A306                          FACILITY:  [facility]   PHYSICIAN:  Kassie Mends, M.D.      DATE OF BIRTH:  Jul 16, 1980   DATE OF CONSULTATION:  01/09/2009  DATE OF DISCHARGE:                                 CONSULTATION   REASON FOR CONSULTATION:  Abdominal pain.   HISTORY OF PRESENT ILLNESS:  [name] is a 30 year old female who  reports having abdominal pain since she was a child.  She has had  constipation since she was a child.  She reports that the abdominal pain  and constipation has gotten worse over the last year.  Her last bowel  movement was a small one, 3-4 days ago.  She is admitted due to  cellulitis bilaterally in her forearms.  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  She reports not using  any drugs for the last 4 months.  She feels pain across the middle of  her abdomen.  It shoots back and forth.  She describes it as sharp,  aching, and crampy.  She says it is present all the time.  It keeps her  awake 4 to 5 nights a week.  The majority of the week she has abdominal  pain.  Pain is always the same.  She reports no gross hematuria.  She  does complain of burning with urination.  She said the last time she had  a temperature up to 102 was about a week ago.  It was associated with  pain and redness in her arms.  She also complains of nausea 3 to 4 days  a week.  The nausea is all day.  Sometimes she is up at night.  She  reports vomiting 5 days per week.  She denies any problems swallowing.  She reports losing 15 pounds in 2 weeks.  She has been dealing with  dental issues for the last 3 years.  She has been dealing with the pain  in her arms and the redness for 2 weeks.   She tried to tolerate stewed beef and rice, but she threw it up.  She  denies any redness or pus from her gums.  She has pain in her  fractured  teeth.   PAST MEDICAL HISTORY:  1. Seizure disorder.  2. Bipolar disorder.  3. Asthma.  4. Panic attacks  5. Reported history of cirrhosis.  6. Chronic back and stomach pain.   PAST HISTORY:  1. Hysterectomy secondary to cervical cancer.  2. Ovarian cyst removal.  3. Six D and Cs  4. Cholecystectomy due to her gallbladder shutting down (no known      stones).   ALLERGIES:  PENICILLIN, ASPIRIN, and ACETAMINOPHEN.   FAMILY HISTORY:  She reports that her mother had colon cancer at age 53  and required chemotherapy and possibly radiation.  Her mother is now 28  years old.  She has no family history of polyps.  She  reports having  colon cancer in her grandmother and her great grandfather.   SOCIAL HISTORY:  She smokes half a pack to one pack a day.  She uses  alcohol less than once a week.  Her mother and her brother-in-law  continue to use cocaine and snort cocaine in her presence.  Previously  she snorted cocaine because she was dating a Higher education careers adviser.  She has also  used narcotics that she obtained on the streets.  She pays for her  medications via Medicaid.   REVIEW OF SYSTEMS:  WEIGHT JAN 2009: 132 LBS. She also complains of  seeing rectal bleeding when she has to strain to have a bowel movement.  Her review of systems per the HPI, otherwise all systems are negative.   PHYSICAL EXAMINATION:  T max 98.5, systolic blood pressure 124-102, O2  sat 97% on room air.  GENERAL:  She is in no apparent distress, alert  and oriented x4.  HEENT:  Atraumatic, normocephalic.  Pupils equal,  react to light.  Mouth no oral lesions, bu has ver poor dentition.  Posterior pharynx without erythema or exudate.  NECK:  Full range of  motion.  No lymphadenopathy.  LUNGS:  Clear to auscultation bilaterally.  CARDIOVASCULAR:  Regular rhythm, no murmur, normal S1-S2.  ABDOMEN:  Bowel sounds are present, soft, nontender, nondistended.  No rebound or  guarding.  EXTREMITIES:  Her lower  extremities have no cyanosis or  edema.  Her upper extremities have edema and mild erythema in the distal  extremities.  NEURO:  She has no focal neurologic deficits.   LABS:  White count 5.2, hemoglobin 12.3, platelets 210, creatinine 0.72,  albumin 3.3, otherwise normal liver panel, lipase 26.  Urine drug screen  positive for opiates and cocaine.  She reports she ate off of a plate  that had cocaine on it within the last month.   RADIOGRAPHY:  Acute abdominal series on July 13 showed no acute intra-  abdominal process, but she was full of stool.   CT scan of the abdomen and pelvis in January 2010 (IV contrast) showed  no focal liver lesions.  She had no acute intra-abdominal process and a  normal appendix.   ASSESSMENT:  [name] is a 30 year old female who complains of nausea  and vomiting, and that has been associated with cellulitis of the upper  extremities.  Her symptoms are likely secondary to infection.  The  differential diagnosis includes a low likelihood of gastroparesis,  atypical uncontrolled gastroesophageal reflux disease, or constipation.  She has chronic abdominal pain which is likely secondary to irritable  bowel syndrome, constipation predominant.  She does have rectal bleeding  and a first-degree relative who reportedly had colon cancer at age 28.   She has no evidence of cirrhosis on CT with IV contrast. Thank you for  allowing me to see Ms. Marston is in consultation.  My recommendations  follow.   RECOMMENDATIONS:  1. Will add MiraLax once every hour until midnight from 6:00 p.m. to      midnight.  On tomorrow, she will have Colace 100 mg t.i.d. and      Fibercon b.i.d..  2. She should continue a full-liquid diet.  Will place her on Zofran      around-the-clock and as needed.  3. Add Protonix at 0700.  4. Agree with checking TSH.  5. She will have a colonoscopy and EGD as an outpatient once her      cellulitis of her upper  extremities has been treated.  OPv with Dr.      Cira Servant in one month.      Kassie Mends, M.D.  Electronically Signed     SM/MEDQ  D:  01/09/2009  T:  01/10/2009  Job:  [id number]

## 2010-11-12 NOTE — H&P (Signed)
NAMEHODAN, [name]             ACCOUNT NO.:  [id number]   MEDICAL RECORD NO.:  [id number]          PATIENT TYPE:  INP   LOCATION:  IC01                          FACILITY:  [facility]   PHYSICIAN:  Margaretmary Dys, M.D.DATE OF BIRTH:  03/01/1981   DATE OF ADMISSION:  01/08/2009  DATE OF DISCHARGE:  LH                              HISTORY & PHYSICAL   ADMISSION DIAGNOSES:  1. Abscess with cellulitis of the right forearm and left arm.  2. History of intravenous drug abuse.  The patient claims her last use      was about four months ago.  3. History of anxiety.  4. History of bipolar disorder.   CHIEF COMPLAINT:  Swelling and redness of both forearms.   HISTORY OF PRESENT ILLNESS:  [name] is a 30 year old female who  presented with complaint of swelling of both arms.  The patient was seen  on January 05, 2009, and was discharged home with topical Voltaren  according to the patient.  The patient now returns with worsening  redness and swelling of the forearm.  The patient does have cellulitis.  The patient reports trying to drain one of the abscesses on her right  forearm.  It did not drain much, but she now has some surrounding  erythema, swelling and induration in the forearm.  She has had some  fevers and chills.  She denies any rigors.  No nausea or vomiting.  No  abdominal pain.  No chest pain or shortness of breath.  The patient's  symptoms are essentially unremarkable.   REVIEW OF SYSTEMS:  Essentially unremarkable.   PAST MEDICAL HISTORY:  1. Asthma.  2. Anemia.  3. Anxiety.  4. Bipolar disorder.  5. Cervical cancer.  6. Chronic back pain.  7. Seizure disorder.   PAST SURGICAL HISTORY:  1. Status post cholecystectomy.  2. Status post dilatation and curettage.  3. Status post hysterectomy.  4. Status post oophorectomy.  5. Status post bilateral tubal ligation.   MEDICATIONS:  The patient reports taking:  1. Tegretol 150 mg p.o. b.i.d.  2. Neurontin 300 mg p.o.  b.i.d.  3. Albuterol inhalers p.r.n.  4. The patient was also who apparently given a prescription for      doxycycline 100 mg b.i.d.   ALLERGIES:  1. PENICILLIN.  2. DARVOCET-N.  3. TYLENOL.  4. NONSTEROIDAL ANTI-INFLAMMATORY DRUGS.  5. ASPIRIN.   SOCIAL HISTORY:  The patient is single.  She has four children who live  with her.  She has a previous IV drug abuse history until about four  months ago.  She does smoke about one pack of cigarettes per day.  She  denies any alcohol abuse.  She denies any other illicit drug use  including crack cocaine.   FAMILY HISTORY:  Noncontributory.   PHYSICAL EXAMINATION:  GENERAL:  The patient was conscious, alert,  comfortable and not in acute distress.  She was well oriented to time,  place and person.  VITAL SIGNS:  Blood pressure 110/70, pulse 86, temperature 98.3 degrees  Fahrenheit, oxygen saturation 97% on room air.  HEENT:  Normocephalic, atraumatic.  Oral mucosa was moist.  No exudates.  NECK:  Supple.  No JVD or lymphadenopathy.  LUNGS:  Clear to auscultation and percussion.  Good air entry  bilaterally.  HEART:  S1 and S2 regular.  No murmurs, gallops or rubs.  ABDOMEN:  Soft, nontender.  Bowel sounds positive.  No masses palpable.  EXTREMITIES:  The patient has diffuse bruising in shin.  The patient's  forearm has diffuse swelling with erythema especially on the left  forearm and the wrist.  The patient also has a 2 cm laceration over her  right forearm with surrounding erythema.  This is where the patient said  she cut herself.   LABORATORY/DIAGNOSTIC DATA:  White blood cell count 8.2, hemoglobin  11.6, hematocrit 34.2, platelet count 235,000 with no left shift.  Sodium 139, potassium 3.8, chloride 107, CO2 24, glucose 92, BUN 4,  creatinine 0.7.  Urinalysis was negative.   ASSESSMENT:  This is a 30 year old female who presented to the emergency  room with complaints of abscess and swelling in the left arm and also  right  arm.  This is highly suggestive of methicillin-resistant  Staphylococcus aureus infection.  The patient failed outpatient therapy.  Wound culture obtained on Nov 01, 2008, was negative.  The patient has  evolving symptoms with worsening.   PLAN:  1. Will admit to the medical floor.  2. Will start the patient empirically on vancomycin 1 gm IV q. 12.  3. Will also add clindamycin IV q.8 h.  Will request pharmacy to dose.  4. Resume all home medications.  5. Put on Lovenox 40 mg subcu. for DVT prophylaxis.  6. The patient's forearm shows no punctum.  Suggests possibility of      drainage at this time, so we will just continue on IV antibiotics      and see how she does.  I have explained the above plan to the      patient in detail.  She verbalized full understanding.      Margaretmary Dys, M.D.  Electronically Signed     AM/MEDQ  D:  01/08/2009  T:  01/08/2009  Job:  [id number]

## 2010-11-12 NOTE — Discharge Summary (Signed)
[name], [name]             ACCOUNT NO.:  [id number]   MEDICAL RECORD NO.:  [id number]          PATIENT TYPE:  INP   LOCATION:  A306                          FACILITY:  [facility]   PHYSICIAN:  Lonia Blood, M.D.      DATE OF BIRTH:  1980-10-14   DATE OF ADMISSION:  01/08/2009  DATE OF DISCHARGE:  07/14/2010LH                               DISCHARGE SUMMARY   PRIMARY CARE PHYSICIAN:  The patient is unassigned.   DISCHARGE DIAGNOSES:  1. Right forearm cellulitis.  2. Polysubstance abuse.  3. Hematochezia with abdominal pain, nausea, vomiting, workup in      process.  4. Chronic pain.  5. Seizure disorder.  6. Bipolar disorder.  7. Mild anemia.   DISCHARGE MEDICATIONS:  1. Neurontin 300 mg p.o. b.i.d.  2. Tegretol 150 mg b.i.d.  3. Doxycycline 100 mg p.o. b.i.d. x5 days.  4. Albuterol inhaler as needed.  5. Chewable multivitamin.  6. Clindamycin 150 mg p.o. q.i.d. for 5 days.  7. Vicodin 5/500 one tablet q.6 h. p.r.n., given 20.   DISPOSITION:  The patient will be discharged home.  She will follow up  with Dr. Kassie Mends for outpatient colonoscopy.  She will complete 5  days of antibiotics.  She has been counseled extensively on  polysubstance abuse.   PROCEDURES PERFORMED THIS ADMISSION:  Acute abdominal series performed  July 13, that showed normal bowel gas pattern, moderate retained stool,  no evidence for free peritoneal air or active chest disease.   CONSULTATIONS:  Kassie Mends, MD, GI.   BRIEF HISTORY AND PHYSICAL:  Please refer to dictated history and  physical by Dr. Sherle Poe.  In short, however, this is a 30 year old  female whose chief complaint with swelling of both arms.  She was seen  in the ER on the 9th, discharged home with topical Voltaren after having  some swelling.  She return with worsening redness and swelling of her  forearm.  It looked like she had cellulitis.  She has history of  polysubstance abuse also.  She was subsequently admitted for  further  management.   HOSPITAL COURSE:  1. Left upper extremity cellulitis.  The patient was admitted and      worked up for possible cellulitis.  Her blood cultures remained      negative.  Her drug screen was, however, positive for both opiates      and cocaine.  She was treated with IV vancomycin and clindamycin.      In the past 2 days, her arm swelling has reduced, and the redness      has virtually disappeared.  At this point, she is ready for      discharge.  She has been counseled again and would be placed on      clindamycin as well as doxycycline for continued further treatment      at home.  2. Polysubstance abuse.  Again, she has been counseled extensively and      has been informed that she needs to quit her polysubstance abuse.  3. Seizure disorder.  We will continue the patient on  her home      medications.  4. Bipolar disorder.  Again, the patient to continue on her regular      home medicines.   The patient is subsequently being discharged home to follow with her  primary care physician as well as Dr. Cira Servant.      Lonia Blood, M.D.  Electronically Signed     LG/MEDQ  D:  01/10/2009  T:  01/11/2009  Job:  [id number]

## 2010-11-12 NOTE — Discharge Summary (Signed)
[name], [name]             ACCOUNT NO.:  [id number]   MEDICAL RECORD NO.:  [id number]          PATIENT TYPE:  INP   LOCATION:  A306                          FACILITY:  [facility]   PHYSICIAN:  Tilda Burrow, M.D. DATE OF BIRTH:  Jan 16, 1981   DATE OF ADMISSION:  07/12/2007  DATE OF DISCHARGE:  01/13/2009LH                               DISCHARGE SUMMARY   ADMITTING DIAGNOSES:  1. Pelvic pain.  2. Heavy menses.  3. Dyspareunia.   DISCHARGE DIAGNOSES:  1. Pelvic pain.  2. Heavy menses.  3. Dyspareunia.   PROCEDURE:  1. Vaginal hysterectomy.  2. Left salpingo-oophorectomy.   DISCHARGE MEDICATIONS:  1. Tegretol 300 mg p.o. daily.  2. Percocet 5/325 at 30 tablets 1 every 6 hours p.r.n. pain.  3. Colace 100 mg twice daily x30 days.  4. Motrin 630 tablets 1 every 6 hours p.r.n. pain.   FOLLOWUP:  In 2 weeks at [facility].   HOSPITAL SUMMARY:  This 30 year old female with chronic pain complaints  and chronic narcotic use improved in the last couple years with  unacceptable heavy and painful menses associated with retroverted uterus  and enlarged left ovary which is located in the cul-de-sac was taken to  the OR on the day of admission, July 12, 2007, for vaginal  hysterectomy and was performed with 150 mL.  The procedure was performed  without difficulty.  Postoperative course was straightforward with  hemoglobin 10, hematocrit 30.7, white count 6300 postoperatively.  Preoperative hemoglobin had been 12.7, hematocrit 39.2, white count was  6400 postop.  Patient had uneventful operative course and went home.  Pathology report is not noted in the paper chart and is lnot yet ocated  in the _electronic  Medical Records.      Tilda Burrow, M.D.  Electronically Signed     JVF/MEDQ  D:  08/04/2007  T:  08/05/2007  Job:  [id number]

## 2010-11-15 NOTE — Op Note (Signed)
[name], [name]             ACCOUNT NO.:  [id number]   MEDICAL RECORD NO.:  [id number]          PATIENT TYPE:  AMB   LOCATION:  DAY                           FACILITY:  [facility]   PHYSICIAN:  Tilda Burrow, M.D. DATE OF BIRTH:  06-Nov-1980   DATE OF PROCEDURE:  09/03/2006  DATE OF DISCHARGE:  09/03/2006                               OPERATIVE REPORT   PREOPERATIVE DIAGNOSIS:  Elective sterilization.   POSTOPERATIVE DIAGNOSIS:  Elective sterilization.   PROCEDURE:  Laparoscopic tubal sterilization.   SURGEON:  Christin Bach, M.D.   ASSISTANT:  None.   ANESTHESIA:  General.   SPECIMEN:  None.   COMPLICATIONS:  None.   FINDINGS:  Normal tubes and ovaries bilaterally, minimal blood loss, no  adhesions noted.  Marked pelvic relaxation noted.   DETAILS OF PROCEDURE:  The patient was taken to the operating room,  prepped and draped in the usual standard fashion with legs in low  lithotomy leg supports after general anesthesia was introduced without  difficulty.  The bladder was in-and-out catheterized and Hulka tenaculum  attached to the cervix for uterine  manipulation.  An infraumbilical,  vertical, 1-cm, skin incision was made as well as a transverse  suprapubic 1-cm incision. A Veress needle was used to achieve  pneumoperitoneum through the umbilical incision while being careful to  orient the needle toward the pelvis while elevating the abdominal wall  by manual elevation. Water droplet test was used to confirm  intraperitoneal placement.   Pneumoperitoneum was achieved easily under 8-to-10 mm of intra-abdominal  pressure; and the laparoscopic trocar was introduced, a 5-mm blunt  tipped trocar, under direct visualization using the video camera.  Peritoneal cavity was entered without difficulty.  Inspection of the  anterior surfaces of the abdominal contents showed no evidence of injury  or bleeding.  Attention was directed to the pelvis.  Findings were as  described  above.   Attention was first directed to the left fallopian tube which was  elevated, identified to its fimbriated end and grasped in its mid  portion with Falope ring applier.  Falope ring applied and then the tube  infiltrated with Marcaine solution 0.25% using a 22-gauge spinal needle  percutaneously applied.   Attention was then directed to the right fallopian tube where a similar  procedure was performed.  Photo documentation of the ring placements was  performed; 120 mL of saline was instilled into the abdomen; deflation of  CO2 performed; instruments removed and subcuticular 4-0 Dexon closure of  skin incisions performed.  The rest of the surgical instruments were  removed; Steri-Strips placed.  The patient allowed to awaken and go to  recovery room in standard fashion.      Tilda Burrow, M.D.  Electronically Signed     JVF/MEDQ  D:  09/16/2006  T:  09/17/2006  Job:  [id number]

## 2010-11-15 NOTE — Op Note (Signed)
[name], [name]             ACCOUNT NO.:  [id number]   MEDICAL RECORD NO.:  [id number]          PATIENT TYPE:  AMB   LOCATION:  DAY                           FACILITY:  [facility]   PHYSICIAN:  Dalia Heading, M.D.  DATE OF BIRTH:  1981-01-11   DATE OF PROCEDURE:  08/10/2006  DATE OF DISCHARGE:                               OPERATIVE REPORT   PREOPERATIVE DIAGNOSIS:  Chronic cholecystitis.   POSTOPERATIVE DIAGNOSIS:  Chronic cholecystitis.   PROCEDURE:  Laparoscopic cholecystectomy.   SURGEON:  Dr. Franky Macho   ANESTHESIA:  General endotracheal.   INDICATIONS:  The patient is a 30 year old white female who presents  with right upper quadrant abdominal pain and a thickened gallbladder  wall on ultrasound.  Risks and benefits of the procedure including  bleeding, infection, hepatobiliary injury, the possibly of an open  procedure were fully explained to the patient, who gave informed  consent.   PROCEDURE NOTE:  The patient was placed in supine position.  After  induction of general endotracheal anesthesia, the abdomen was prepped  and draped in the usual sterile technique with Betadine.  Surgical site  confirmation was performed.   An infraumbilical incision was made down to the fascia.  Veress needle  was introduced into the abdominal cavity and confirmation of placement  was done using the saline drop test.  The abdomen was then insufflated  to 16 mmHg pressure.  An 11-mm trocar was introduced into the abdominal  cavity under direct visualization without difficulty.  The patient was  placed in reversed Trendelenburg position.  An additional 11-mm trocar  was placed in the epigastric region and 5-mm trocars were placed in the  right upper quadrant and right flank regions.  Liver was inspected and  noted be within normal limits.  Gallbladder was retracted superior and  laterally.  The dissection was begun around the infundibulum of the  gallbladder.  Cystic duct was  first identified, its juncture to the  infundibulum fully identified.  Endo clips placed proximally and  distally on the cystic duct and cystic duct was divided.  This was  likewise done to the cystic artery.  The gallbladder then freed away  from the gallbladder fossa using Bovie electrocautery.  The gallbladder  was delivered through the epigastric trocar site using EndoCatch bag.  The gallbladder fossa was inspected and no  abnormal bleeding or bile  leakage was noted.  Surgicel was placed in the gallbladder fossa.  All  fluid and then air were then evacuated from the abdominal cavity prior  to removal of the trocars.   All wounds were irrigated normal saline.  All wounds were injected with  0.5 cm Sensorcaine.  The infraumbilical fascia was reapproximated using  a 0 Vicryl interrupted suture.  All skin incisions were closed using  staples.  Betadine ointment dry sterile dressings were applied.   All tape and needle counts correct at the end of the procedure.  The  patient was extubated in the operating room went back to recovery room  awake in stable condition.   COMPLICATIONS:  None.   SPECIMEN:  Gallbladder.  BLOOD LOSS:  Minimal.      Dalia Heading, M.D.  Electronically Signed     MAJ/MEDQ  D:  08/10/2006  T:  08/10/2006  Job:  [id number]   cc:   Lazaro Arms, M.D.  Fax: [phone]

## 2010-11-15 NOTE — Op Note (Signed)
[name], [name]                       ACCOUNT NO.:  [id number]   MEDICAL RECORD NO.:  [id number]                   PATIENT TYPE:  AMB   LOCATION:  DAY                                  FACILITY:  [facility]   PHYSICIAN:  Langley Gauss, MD                  DATE OF BIRTH:  1981/03/18   DATE OF PROCEDURE:  DATE OF DISCHARGE:                                 OPERATIVE REPORT   PREOPERATIVE DIAGNOSES:  1. Irregular menses.  2. Anemia secondary to #1.   POSTOPERATIVE DIAGNOSES:  1. Irregular menses.  2. Anemia secondary to #1.   PROCEDURE:  Dilatation and curettage.   SURGEON:  Langley Gauss, MD   ESTIMATED BLOOD LOSS:  Minimal   SPECIMENS:  Uterine curettings to pathology with permanent section only.   ANESTHESIA:  General by LMA.   FINDINGS:  Findings at the time of surgery include a retroflexed uterus that  sounds to a depth of 8 cm.  There is a moderate amount of normal appearing  endometrial tissue obtained. There are noted to be no cervical lacerations.   DESCRIPTION OF PROCEDURE:  The patient was taken to the operating room and  vital signs were stable.  The patient underwent uncomplicated induction of  general anesthesia after which time she was placed in the low lithotomy  position and prepped and draped in the usual sterile manner.  A speculum was  used to visualize the cervix.  The cervix was without lesions.  There was  noted to be only very mild vaginal bleeding at present.  The anterior lip of  the cervix is grasped with a single toothed tenaculum.  The uterus was noted  to sound to a depth of 8 cm and noted to be retroflexed.  Progressive  dilatation was then carefully performed up to a size #13 dilator which  allows passage of a small banjo curette through the endocervix and into the  uterus itself.  Aggressive curettage was then performed of the entire  uterine cavity to include the uterine fundus until a fine gritty sensation  was appreciated in all  quadrants of the uterus.  The intrauterine contour is  noted to be normal in consistency with no evidence of any septations or  loculations of fluid.  Curettage was continued until no further tissue was  obtained.  Bimanual  examination then reveals a retroflexed, normal sized uterus, freely mobile  with no adnexal masses.  Minimal vaginal bleeding occurring at that time.  Thus, the patient is reversed of anesthesia and taken to the recovery room  in stable condition.                                               Langley Gauss, MD    DC/MEDQ  D:  04/21/2002  T:  04/22/2002  Job:  [id number]

## 2010-11-15 NOTE — H&P (Signed)
[name], [name]             ACCOUNT NO.:  [id number]   MEDICAL RECORD NO.:  [id number]          PATIENT TYPE:  AMB   LOCATION:  DAY                           FACILITY:  [facility]   PHYSICIAN:  Tilda Burrow, M.D. DATE OF BIRTH:  1981-03-08   DATE OF ADMISSION:  DATE OF DISCHARGE:  LH                              HISTORY & PHYSICAL   PREOPERATIVE DIAGNOSIS:  Elective sterilization, recent urinary tract  infection, resolved, recent laparoscopic cholecystectomy.  Chronic pain  complaints.   HISTORY OF PRESENT ILLNESS:  This 30 year old female, now six weeks  status post vaginal delivery, sterilization.  She has been seen in the  office recently for evaluation and scheduling of elective permanent  sterilization.  Post partum course has been quite complicated.  She had  an episode of lower abdominal discomfort.  She had an episode of lower  abdominal discomfort on July 17, 2006, which required hospitalization  for suspected endometritis due to pelvic discomfort.  She had a  temperature of 103 and responded to antibiotic regimen.  She still was  not completely comfortable, and was seen back and found by Dr. Despina Hidden to  have cholecystitis and sludge in the gallbladder, which was treated with  laparoscopic cholecystectomy two weeks ago.  Unfortunately, she did not  inform personnel that she had plans for tubal sterilization, the  cholecystectomy was done without addressing the permanent sterilization.  She is now readmitted at this time to accomplish permanent  sterilization.   PAST MEDICAL HISTORY:  Notable for chronic pain meds utilization  throughout the pregnancy.  She has had a longstanding history of reflux  symptoms.  Pregnancy course was notable for Chlamydia, which was  positive, and treated.  At this time, she desires proceeding with  permanent sterilization.  She has had a recent upper respiratory  infection with low-grade fever associated with that.   PHYSICAL  EXAMINATION:  GENERAL:  She is a somber, anxious-appearing  Caucasian female who is alert and oriented x3.  VITAL SIGNS:  Weight 133.6.  Urinalysis negative.  Blood pressure  110/80.  HEENT:  Pupils are equal, round and reactive.  NECK:  Supple.  Normal trachea.  LUNGS:  Clear to auscultation.  ABDOMEN:  Nontender.  PELVIC:  External genitalia.  Multiparous cervix slightly tender to  manipulation.  Uterus retroverted, retroflexed.  Adnexa negative.   PLAN:  Laparoscopic tubal sterilization with Falope rings on September 03, 2006.     Tilda Burrow, M.D.  Electronically Signed    JVF/MEDQ  D:  08/25/2006  T:  08/25/2006  Job:  [id number]
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
## 2010-11-15 NOTE — Op Note (Signed)
[name], [name]                       ACCOUNT NO.:  [id number]   MEDICAL RECORD NO.:  [id number]                   PATIENT TYPE:  INP   LOCATION:  A403                                 FACILITY:  [facility]   PHYSICIAN:  Lazaro Arms, M.D.                DATE OF BIRTH:  1981/03/26   DATE OF PROCEDURE:  09/04/2003  DATE OF DISCHARGE:                                 OPERATIVE REPORT   DELIVERY NOTE   DATE OF DELIVERY:  September 04, 2003 at 1346.   ONSET OF LABOR:  September 04, 2003 at 9 a.m.   LENGTH OF FIRST STAGE LABOR:  4 hours and 45 minutes   LENGTH OF SECOND STAGE LABOR:  1 minute   LENGTH OF THIRD STAGE LABOR:  4 minutes.   DELIVERY NOTE:  This patient had a normal spontaneous vaginal delivery of a  viable female child with Apgars of 9 and 9 over an intact perineum without any  complications.  Third stage of labor was actually managed with 20 units of  Pitocin in 1000 cc of D5 LR at a rapid rate.  Placenta was delivered  spontaneously via Tomasa Blase mechanism with membranes noted to be intact upon  inspection.  Estimated blood loss was approximately 300 cc.   Infant and mother were stabilized and transferred out to the postpartum unit  in stable condition.     ________________________________________  ___________________________________________  Zerita Boers, N.M.                       Lazaro Arms, M.D.   DL/MEDQ  D:  30/86/5784  T:  09/04/2003  Job:  [id number]   cc:   [facility]

## 2010-11-15 NOTE — Op Note (Signed)
[name], [name]                       ACCOUNT NO.:  [id number]   MEDICAL RECORD NO.:  [id number]                   PATIENT TYPE:  INP   LOCATION:  A426                                 FACILITY:  [facility]   PHYSICIAN:  Langley Gauss, M.D.                DATE OF BIRTH:  10-09-1980   DATE OF PROCEDURE:  08/16/2002  DATE OF DISCHARGE:  08/17/2002                                 OPERATIVE REPORT   PREOPERATIVE DIAGNOSES:  1. Abnormal uterine bleeding.  2. Right lower quadrant pelvic pain with complex cystic appearing mass.  3. Dysmenorrhea.   POSTOPERATIVE DIAGNOSES:  1. Abnormal uterine bleeding.  2. Right lower quadrant pelvic pain with complex cystic appearing mass.  3. Dysmenorrhea.   PROCEDURE:  1. Dilatation and curettage.  2. Operative laparoscopy with right ovarian cystectomy.   SURGEON:  Langley Gauss, M.D.   ESTIMATED BLOOD LOSS:  50 ml   ANESTHESIA:  General endotracheal.   SPECIMENS:  Right ovarian cyst wall for permanent specimen in addition to  uterine curettings for permanent specimen.   FINDINGS:  Diffusely enlarged globular uterine fundus, a 3 cm complex cystic  mass within the right ovary as well as some filmy adhesions between the  right fallopian tube encasing the right ovary. The left ovary was noted to  be normal in appearance.   DESCRIPTION OF PROCEDURE:  The patient was taken to the operating room,  vital signs were stable, patient underwent uncomplicated induction of  general endotracheal anesthesia. She was then placed in the dorsal lithotomy  position, prepped and draped in the usual sterile manner. Sterile speculum  examination revealed the cervix to be without lesions, no active bleeding is  noted at this time. The anterior lip of the cervix is grasped with an single  tooth tenaculum, the uterus is noted to sound to a depth of 10 cm, noted to  be retroflexed with very excellent uterine descensus. Progressive dilatation  is then performed  up to a size #17 dilator which allows passage of a small  banjo curet through the endocervix and into the uterus itself.  Aggressive  curettage is then performed of the entire uterine cavity to include the  uterine fundus until a fine gritty sensation was appreciated in all  quadrants of the uterus. Only a small amount of tissue is obtained, no  irregularity of the intrauterine contour. The specimen sent for a permanent  section only. A Hulka intrauterine manipulator was then passed through the  cervix and used to grasp the cervix at 12 o'clock. I then changed to sterile  gloves standing at the patient's left side after sterilely placing a Foley  catheter. A 1 cm vertical incision is made just inferior to the umbilicus,  likewise a suprapubic midline incision is made. The abdominal wall is then  elevated, the Veress needle is then passed through this subumbilical  incision angling perpendicular to the fascial plane to  atraumatically enter  the peritoneal cavity. A drop test confirms a proper intraperitoneal  placement with no apparent bowel or vascular injury. Pneumoperitoneum is  then created by insufflating 3 liters of CO2 gas with a filling pressure of  less than 16 mmHg. After assurance of adequate pneumoperitoneum, the Veress  needle was removed, the abdominal wall is again elevated, a 10 mm Visiport  trocar sleeve are the inserted through the subumbilical incision angling  towards the hollow of the pelvis. This was done atraumatically. The trocar  is then removed. The scope was inserted which confirms a proper  intraperitoneal placement with no apparent bowel or vascular injury. Under  direct visualization, a 5 mm trocar sleeve was used to insert a second port.  A third port is then placed to the left of the midline likewise under direct  visualization. With the patient now in Trendelenburg position, the pelvis is  noted as described previously. Pertinently within the right adnexal  region  is a 3 cm cystic complex appearing mass as well as fine filmy adhesive  disease encasing the right ovary and originated from the right fallopian  tube. These were easily bluntly lysed to fully free up the ovary. With the  ovary now grasped with an alligator clamp, I was able to incise over the  cystic appearing area with a [place] unipolar cautery.  At this time very  dark hemorrhagic appearing fluid is noted to efflux from the cystic cavity  itself. This then allows me to utilize another alligator clamp to grasp the  very thick cystic wall and remove this in a continuous dissecting and  twisting motion to involve removal of the entirety of the cystic wall. The  ovarian capsule edges are overlying it in order to fall together  spontaneously. Irrigation is performed as well as unipolar cautery at the  bed of the ovary to result in hemostasis. Irrigation is performed until  hemostasis is then assured. A single piece of Surgicel medi cloth is placed  within the base of the cyst dissected free and a second Surgicel cloth is  wrapped around the right ovary. The left ovary is noted to be normal in  appearance. Anterior and posterior cul-de-sac noted to be normal in  appearance. Uterus as described previously with a globular prominent  appearing fundus which very likely represents adenomyosis. About 100 ml of  sterile normal saline is then left within the pelvic cavity in an effort to  prevent postoperative adhesion formation. The procedure is then terminated,  all instruments are removed and gas is allowed to escape. Our three incision  sites are then closed utilizing #0 Vicryl suture in a deep interrupted  fashion through and through the fascial edges followed by a 3-0 Ethilon  suture in a figure-of-eight fashion to close the overlying skin edges. A  total of 10 ml of 0.5% bupivacaine plain is then injected subcutaneously along our three incisions to facilitate postoperative analgesia.  The patient  tolerated the procedure well. She continues to drain clear yellow urine. She  is extubated and taken to the recovery room in stable condition. Operative  findings discussed with the patient's waiting family. The patient will be  referred to the fourth floor and continued on observation status.  Langley Gauss, M.D.    DC/MEDQ  D:  08/19/2002  T:  08/19/2002  Job:  [id number]

## 2010-11-15 NOTE — H&P (Signed)
[name], [name]             ACCOUNT NO.:  [id number]   MEDICAL RECORD NO.:  [id number]          PATIENT TYPE:  OIB   LOCATION:  LDR3                          FACILITY:  [facility]   PHYSICIAN:  Tilda Burrow, M.D. DATE OF BIRTH:  1981/02/28   DATE OF ADMISSION:  05/19/2006  DATE OF DISCHARGE:  LH                              HISTORY & PHYSICAL   OBSERVATION ADMISSION NOTE   REASON FOR ADMISSION:  Pregnancy at 31 weeks with abdominal pain.   HISTORY OF PRESENT ILLNESS:  [name] is a 30 year old gravida IV para  3, EDC 119, who is in with complaints of vague abdominal pain.  After  monitoring on the electronic fetal monitor for several hours, there is  no regular contraction pattern noted, just isolated.  Occasional  contractions.  Cervix on admission was 1, firm, long and presenting part  was _ballotable__.  At the time of discharge, at 1:30, there is no noted  change in cervix.   PLAN:  Give her Tylox 2 p.o. for discomfort at her request and discharge  her home to follow up with Dr. Emelda Fear in the office on Wednesday or  p.r.n.      Zerita Boers, Lanier Clam      Tilda Burrow, M.D.  Electronically Signed    DL/MEDQ  D:  04/54/0981  T:  05/19/2006  Job:  [id number]

## 2010-11-15 NOTE — H&P (Signed)
[name], [name]             ACCOUNT NO.:  [id number]   MEDICAL RECORD NO.:  [id number]          PATIENT TYPE:  OBV   LOCATION:  A417                          FACILITY:  [facility]   PHYSICIAN:  Dalia Heading, M.D.  DATE OF BIRTH:  [birth year]   DATE OF ADMISSION:  08/10/2006  DATE OF DISCHARGE:  LH                              HISTORY & PHYSICAL   CHIEF COMPLAINT:  Chronic cholecystitis.   HISTORY OF PRESENT ILLNESS:  The patient is a 30 year old white female  who is referred for evaluation and treatment of biliary colic secondary  to chronic cholecystitis.  She has been having right upper quadrant  abdominal pain, nausea, and bloating for many weeks since the birth of  her child.  She does have fatty food intolerance.  No fever, chills, or  jaundice have been noted.   PAST MEDICAL HISTORY:  As noted above.   PAST SURGICAL HISTORY:  Laparoscopy.   CURRENT MEDICATIONS:  Prevacid, Phenergan, Tylox.   ALLERGIES:  PENICILLIN.   REVIEW OF SYSTEMS:  The patient smokes a pack of cigarettes a day.  She  drinks alcohol rarely.  No other cardiopulmonary difficulties are noted.  She does have a history of possibly having easy bruising.  This has not  been worked up in the past.   PHYSICAL EXAMINATION:  GENERAL:  The patient is a well-developed, well-  nourished white female in no acute distress.  HEENT:  Examination reveals no scleral icterus.  LUNGS:  Clear to auscultation with equal breath sounds bilaterally.  HEART:  Examination reveals regular rate and rhythm without S3, S4, or  murmurs.  ABDOMEN:  The abdomen is soft and nondistended.  She is tender in the  right upper quadrant to palpation.  No hepatosplenomegaly, masses, or  hernias are identified.   STUDIES:  Ultrasound of the gallbladder reveals a thickened wall with a  normal common bile duct and no stones.   IMPRESSION:  Chronic cholecystitis.   PLAN:  The patient is scheduled for laparoscopic cholecystectomy on  August 10, 2006.  The risks and benefits of the procedure including  bleeding, infection, hepatobiliary injury, and the possibly of an open  procedure were fully explained to the patient, who gave informed  consent.      Dalia Heading, M.D.  Electronically Signed     MAJ/MEDQ  D:  08/06/2006  T:  08/06/2006  Job:  [id number]   cc:   [facility]  Fax: [phone]   Lazaro Arms, M.D.  Fax: [phone]
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
## 2010-11-15 NOTE — Op Note (Signed)
[name], FILIPPONE             ACCOUNT NO.:  [id number]   MEDICAL RECORD NO.:  [id number]          PATIENT TYPE:  INP   LOCATION:  A412                          FACILITY:  [facility]   PHYSICIAN:  Lazaro Arms, M.D.   DATE OF BIRTH:  20-Feb-1981   DATE OF PROCEDURE:  07/12/2006  DATE OF DISCHARGE:                               OPERATIVE REPORT   Kathy is a 30 year old white female gravida 4, para 3, abortus 0,  estimated date of delivery of July 17, 2006 currently at 39-1/2  weeks' gestation who presented labor and delivery in spontaneous labor.  She has sort of prodromal labor all night then progressed to 4 cm.  By  rupture of membranes, she was 5.50 and -2 with mild meconium present.   She progressed nicely through the active phase of labor required 40  milliunits of augmentation, was called to the room and she precipitously  delivered the head, delivered the shoulders without difficulty at 1517.  That was a female infant with Apgars of 8 and 9.  Infant underwent suction  after delivery.  Cord blood and cord gas were sent.  There was three-  vessel cord.  The infant underwent routine neonatal resuscitation.  Placenta was delivered intact at 1524.  Blood loss for delivery was 250  mL.  The uterus was firm below the umbilicus and the patient was  receiving Pitocin and will undergo routine postpartum care.      Lazaro Arms, M.D.  Electronically Signed     LHE/MEDQ  D:  07/12/2006  T:  07/13/2006  Job:  [id number]

## 2010-11-15 NOTE — Discharge Summary (Signed)
NAMELAURETTE, [name]             ACCOUNT NO.:  [id number]   MEDICAL RECORD NO.:  [id number]          PATIENT TYPE:  INP   LOCATION:  A417                          FACILITY:  [facility]   PHYSICIAN:  Tilda Burrow, M.D. DATE OF BIRTH:  16-Aug-1980   DATE OF ADMISSION:  07/17/2006  DATE OF DISCHARGE:  01/20/2008LH                               DISCHARGE SUMMARY   ADMITTING DIAGNOSES:  1. Postpartum endometritis.  2. Poor social situation.  3. Limited home support.  4. History of bipolar disorder.  5. Low pain threshold.   DISCHARGE DIAGNOSES:  1. Postpartum endometritis.  2. Poor social support, limited support.  3. History of bipolar disorder.   HOSPITAL COURSE:  This gravida 4, para 108, 30 year old is admitted with a  temperature to 101.3, white count 17,000.  Heavy lochia, significant  discomfort.  She has been a chronic pain patient.  She is admitted for  antibiotic therapy, see HPI.   HOSPITAL COURSE:  The patient was admitted, had blood cultures performed  which were negative.  Had white count that was elevated, urine culture  was negative.  Her sed rate was dramatically elevated at 72, normal 0-  22.  She was given IV antibiotics Rocephin 1 g in the emergency room,  converted to Cefotan and doxycycline while in the hospital.  She was  afebrile within two days and was discharged July 19, 2006, on  doxycycline 100 mg b.i.d. x10 days and with follow up in our office.  She plans postpartum tubal ligation.  Discharge medications also  included Tylox 15 tablets one every 12 hours p.r.n. pain and Motrin 800  mg x30 tablets.      Tilda Burrow, M.D.  Electronically Signed     JVF/MEDQ  D:  07/19/2006  T:  07/19/2006  Job:  [id number]

## 2010-11-15 NOTE — Discharge Summary (Signed)
[name], PAIN                       ACCOUNT NO.:  [id number]   MEDICAL RECORD NO.:  [id number]                   PATIENT TYPE:  INP   LOCATION:  A426                                 FACILITY:  [facility]   PHYSICIAN:  Langley Gauss, M.D.                DATE OF BIRTH:  [birth year]   DATE OF ADMISSION:  08/15/2002  DATE OF DISCHARGE:  08/17/2002                                 DISCHARGE SUMMARY   BRIEF HISTORY:  The patient initially on observation status on 08/15/2002 to  08/16/2002.  On 08/16/2002, this was changed to admission status, and the  patient was taken to the operating room where [facility] and laparoscopy with  ovarian cystectomy was performed.  Postoperatively, the patient was returned  to the fourth floor at which time she was continued on inpatient status and  discharged to home on 08/17/2002.   At the time of discharge, she was given a copy of the standardized discharge  instructions.   FOLLOW UP:  She will follow up in the office in one weeks' time for suture  removal.  Final pathology will likewise be available at that time.   DISCHARGE MEDICATIONS:  The patient has been treated perioperatively with IV  Ancef.  She is given a prescription for Lortab 10/500, #20 with no refill  which should suffice for the postoperative course until the one-week follow  visit.   LABORATORY DATA:  Hemoglobin 10.7, hematocrit 32.3, white count 4.2.  Electrolytes within normal limits.  Serum HCG is negative.  O positive blood  type.   HOSPITAL COURSE:  As stated previously, the patient presented on 08/15/2002  due to her history of heavy vaginal bleeding with passage of clots, feeling  weak and faint, as well as her prior history of anemia secondary to heavy  menses.  The patient initially was on observation status.   Evaluation on the a.m. of 08/16/2002 revealed that she was not having any  very heavy or excessive vaginal bleeding,  The patient had been treated with  25 mg of IV  Premarin in an effort to control any vaginal bleeding.  Thus, no  transfusion of blood products was required.  As the patient was currently in  the hospital, the planned operative procedure rather than being performed on  08/17/2002 was performed on 08/16/2002.  This was performed without  complications.   Postoperatively, the patient was returned to the fourth floor at which time  vital signs remained stable.  The patient was able to ambulate and void  without difficulty, made frequent trips outside to smoke.  She tolerated  regular diet, and on the day of discharge, 08/17/2002, she tolerated p.o.  narcotics for pain relief.  Abdomen upon examination was soft and nontender,  nondistended.  No bruising is identified.  Thus, the patient is discharged  home on 08/17/2002.  Langley Gauss, M.D.    DC/MEDQ  D:  08/19/2002  T:  08/19/2002  Job:  [id number]

## 2010-11-15 NOTE — Discharge Summary (Signed)
   [name], [name] NO.:  [id number]   MEDICAL RECORD NO.:  [id number]                  PATIENT TYPE:   LOCATION:                                       FACILITY:   PHYSICIAN:  Langley Gauss, MD                  DATE OF BIRTH:   DATE OF ADMISSION:  DATE OF DISCHARGE:                                 DISCHARGE SUMMARY   The patient is to be discharged on date of service.  She is given a copy of  the standardized discharge instructions.  She will follow up in the office  in 4 days time at which time we would like to initiate oral contraceptives  in an effort to provide birth control as well as to regulate her menses.   DISCHARGE MEDICATIONS:  One day previously the patient was given a  prescription for Lortab to take on a p.r.n. basis. In addition she is  treated immediately preoperatively with Cleocin 600 mg IV.  She is given a  prescription preoperatively for doxycycline 100 mg p.o. b.i.d. x 10 days.  The patient likewise was given a prescription for Proventil inhaler as she  is noted to have a history of asthmatic bronchitis and is currently not  under any treatment.   HOSPITAL COURSE:  The patient underwent the dilatation and curettage without  any difficulty.  Vital signs remained stable.  She did well postoperatively  with no postoperative complications.  She was able to ambulate and void  without difficulty, thus, patient discharged to home on date of service  04/21/02.                                               Langley Gauss, MD    DC/MEDQ  D:  04/21/2002  T:  04/22/2002  Job:  [id number]
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
## 2010-11-15 NOTE — H&P (Signed)
[name], [name]             ACCOUNT NO.:  [id number]   MEDICAL RECORD NO.:  [id number]          PATIENT TYPE:  INP   LOCATION:  A417                          FACILITY:  [facility]   PHYSICIAN:  Tilda Burrow, M.D. DATE OF BIRTH:  14-Jan-1981   DATE OF ADMISSION:  07/17/2006  DATE OF DISCHARGE:  LH                              HISTORY & PHYSICAL   ADMITTING DIAGNOSES:  1. Postpartum endometritis.  2. Poor social situation with limited home support.  3. History of bipolar disorder.  4. Postpartum endometritis.   HPI:  This gravida 4, para 4, now awake, status post vaginal delivery,  bottle feeding, with breast engorgement is admitted after presenting  with complaints of abdominal discomfort.  She is found to have a  temperature to 101.3 on admission, increasing to 102.9 with time, white  count 17,000 with hemoglobin 10, hematocrit 32, with normal renal  function. Urinalysis is negative despite heavy lochia.  She is admitted  due to difficulty sorting out if the fever is due to breast engorgement  vs. endometritis.  Patient has always been a chronic pain patient and  describes the pain as a 10/10 initially, reducing it to 4/10 at  subsequent assessment.  She a technically challenging pelvic exam to  interpret due to soreness from obstetric lacerations first degree on the  right side of the vaginal entrance coming from the delivery which have  not been well maintained with bathing and hygiene.  The uterus is  anteflexed, firm with a nonpurulent lochia rubra; however, we cannot  rule out mild endometritis.   This is complicated even worse by the abysmal social situation.  The  patient is living currently with the baby's father's brother's  girlfriend and it is a household that now has at least 6 kids and 6 or 8  people with the weekend coming about, where she describes it as being a  lot of chaos on the weekend.  She is admitted for a day of antibiotic  therapy to ensure that she  is headed toward wellness.   PAST MEDICAL HISTORY:  Bipolar disorder.   SOCIAL HISTORY:  Smokes 1-2 packs per day, nondrinker.   PHYSICAL EXAM:  GENERAL:  Shows a somber appearing Caucasian female with  narrowly plucked eyebrows, alert and oriented x3, oriented to time,  place and person.  ABDOMEN:  Nontender upper abdomen.  Suprapubic mild discomfort.  EXTERNAL GENITALIA:  Loss of lochia, rubra.  The right side of the  vaginal wall was tender with an old obstetric tear that is healing  slowly.  While the patient proceeds with marked tenderness, there is no  suspicion of abscess.  Heavy lochia, rubra in the upper vaginal vault.  UTERUS:  About 12-14 weeks size.  Is appropriate for postpartum status  but cannot rule out endometritis.   PLAN:  1. IV antibiotics.  2. As the patient has received 1 g Rocephin, will begin Cefotetan and      erythromycin and will attempt brief hospital stay.  3. Recheck CBC in a.m.      Tilda Burrow, M.D.  Electronically Signed  JVF/MEDQ  D:  07/17/2006  T:  07/17/2006  Job:  [id number]
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
## 2010-11-15 NOTE — H&P (Signed)
NAMELORENNA, LURRY                       ACCOUNT NO.:  [id number]   MEDICAL RECORD NO.:  [id number]                   PATIENT TYPE:  INP   LOCATION:  A426                                 FACILITY:  [facility]   PHYSICIAN:  Langley Gauss, M.D.                DATE OF BIRTH:  06-16-1981   DATE OF ADMISSION:  08/16/2002  DATE OF DISCHARGE:                                HISTORY & PHYSICAL   HISTORY OF PRESENT ILLNESS:  This is a 30 year old gravida 3, para 2, two  prior vaginal deliveries, who presented to [facility] the p.m. of  08/15/2002 complaining of heavy vaginal bleeding x5 days' duration with  passage of clots, passing frequent golf ball sized clots, significant  cramping associated with these, and most pertinently had begun to feel  lightheaded and according to family report had passed out.  She had been off  of her feet during the daytime, with no significant decrease in the  bleeding.  She had contact with me and was advised to increase her fluid  intake as well as to place a new birth control patch in an effort to  stabilize the endometrium, with the addition of estrogen.  These failed to  result in any significant improvement.  Thus, when she contacted me with  these symptoms, orders were given and she was made a direct observation to  the fourth floor on the p.m. of 08/15/2002.   The patient's past history is pertinent for a prior presentation to the  office at which time she was found to be anemic, with a hemoglobin of 8  requiring emergent D&C at that time.  The patient was noted to have chronic  endometritis on that D&C specimen from 04/01/2002.  She was treated  empirically with antibiotics and most recently has been utilizing birth  control patch which unfortunately she used improperly and thus has had  resultant irregularity of her menses.   The patient also complains of significant right lower quadrant right adnexal  pain which is sharp in nature and has  increased over the past several weeks.  It has been not abated and has required narcotic medication.  She was noted  to have a complex cyst on this area on ultrasound.   PAST MEDICAL HISTORY:  1. Two prior vaginal deliveries.  2. D&C 04/20/2002 for anemia and bleeding with findings of chronic     endometritis.   ALLERGIES:  THE PATIENT STATES SHE IS ALLERGIC TO PENICILLIN WHICH GIVES HER  A RASH.   CURRENT MEDICATIONS:  1. Proventil inhaler.  2. Xanax p.r.n.   SOCIAL HISTORY:  She does smoke one pack per day.   PHYSICAL EXAMINATION:  GENERAL:  Feels weak.  Appears faint.  VITAL SIGNS:  Height 5 feet 7 inches, weight 130, blood pressure 119/75,  pulse rate 80, respiratory rate 20.  HEENT:  Negative.  No adenopathy.  Mucous membranes are moist.  NECK:  Supple.  Thyroid is nonpalpable.  LUNGS:  Clear.  CARDIOVASCULAR:  Regular rate and rhythm.  ABDOMEN:  Soft, nontender.  No surgical scars are identified.  PELVIC:  Normal external genitalia.  The cervix is noted to be without  lesions.  No active bleeding at the time of the pelvic examination, as the  patient had been treated with the initial therapy of 25 mg of IV Premarin in  an effort to stabilize the endometrium.  Bimanual examination reveals a  retroflexed uterus which is multiparous in size, freely mobile, nontender to  manipulation.  Adnexa reveals significant tenderness within the right adnexa  with palpable fullness, consistent with a right adnexal cyst.   LABORATORY DATA:  Laboratory studies obtained following admission revealed a  hemoglobin of 10.6.   PLAN:  The patient initially on an observation status upon presentation on  08/15/2002.  She did have the planned operative procedure scheduled presently  for 08/17/2002, but in light of her inpatient status, she was changed to an  admission status on 08/16/2002, with the plan of proceeding with the [facility] and  laparoscopy with right ovarian cystectomy.  The risks and  benefits of the  planned operative procedure have been discussed previously with the patient  in preparation for the planned procedure on 08/17/2002.                                               Langley Gauss, M.D.    DC/MEDQ  D:  08/19/2002  T:  08/19/2002  Job:  [id number]

## 2010-11-27 ENCOUNTER — Emergency Department (HOSPITAL_COMMUNITY)
Admission: EM | Admit: 2010-11-27 | Discharge: 2010-11-27 | Disposition: A | Discharge status: Home / Self Care | Payer: Self-pay | Attending: Emergency Medicine | Admitting: Emergency Medicine

## 2010-11-27 DIAGNOSIS — M549 Dorsalgia, unspecified: Secondary | ICD-10-CM | POA: Insufficient documentation

## 2010-11-27 DIAGNOSIS — F319 Bipolar disorder, unspecified: Secondary | ICD-10-CM | POA: Insufficient documentation

## 2010-11-27 DIAGNOSIS — F17200 Nicotine dependence, unspecified, uncomplicated: Secondary | ICD-10-CM | POA: Insufficient documentation

## 2010-11-27 LAB — URINALYSIS, MICROSCOPIC ONLY
Bilirubin Urine: NEGATIVE
Nitrite: NEGATIVE
Specific Gravity, Urine: 1.01 (ref 1.005–1.030)
Urobilinogen, UA: 0.2 mg/dL (ref 0.0–1.0)

## 2011-03-19 LAB — DIFFERENTIAL
Eosinophils Absolute: 0.1
Eosinophils Relative: 1
Lymphs Abs: 2.2
Monocytes Absolute: 0.3
Monocytes Relative: 5

## 2011-03-19 LAB — CBC
HCT: 30.7 — ABNORMAL LOW
HCT: 39.2
MCV: 91.3
MCV: 91.7
Platelets: 295
RBC: 3.36 — ABNORMAL LOW
RDW: 15.6 — ABNORMAL HIGH
WBC: 6.4

## 2011-03-19 LAB — COMPREHENSIVE METABOLIC PANEL
Albumin: 3.8
BUN: 5 — ABNORMAL LOW
Creatinine, Ser: 0.34 — ABNORMAL LOW
Potassium: 4
Total Protein: 6.6

## 2011-03-20 LAB — URINALYSIS, ROUTINE W REFLEX MICROSCOPIC
Glucose, UA: NEGATIVE
Ketones, ur: NEGATIVE
Protein, ur: NEGATIVE

## 2011-03-20 LAB — URINE MICROSCOPIC-ADD ON

## 2011-03-21 LAB — RAPID URINE DRUG SCREEN, HOSP PERFORMED
Benzodiazepines: NOT DETECTED
Cocaine: NOT DETECTED
Opiates: POSITIVE — AB
Tetrahydrocannabinol: NOT DETECTED

## 2011-03-21 LAB — COMPREHENSIVE METABOLIC PANEL
ALT: 54 — ABNORMAL HIGH
AST: 45 — ABNORMAL HIGH
Albumin: 4
CO2: 25
Calcium: 9.7
GFR calc Af Amer: >60
GFR calc non Af Amer: >60
Sodium: 138
Total Protein: 7

## 2011-03-21 LAB — DIFFERENTIAL
Eosinophils Absolute: 0.1
Eosinophils Relative: 1
Lymphs Abs: 1.7
Monocytes Absolute: 0.3
Monocytes Relative: 4

## 2011-03-21 LAB — URINALYSIS, ROUTINE W REFLEX MICROSCOPIC
Bilirubin Urine: NEGATIVE
Glucose, UA: NEGATIVE
Hgb urine dipstick: NEGATIVE
Ketones, ur: NEGATIVE
pH: 5.5

## 2011-03-21 LAB — CBC
MCHC: 34.5
RBC: 4.09

## 2011-03-21 LAB — POCT PREGNANCY, URINE
Operator id: 151391
Preg Test, Ur: NEGATIVE

## 2011-03-26 LAB — URINALYSIS, ROUTINE W REFLEX MICROSCOPIC
Bilirubin Urine: NEGATIVE
Hgb urine dipstick: NEGATIVE
Ketones, ur: NEGATIVE
Nitrite: NEGATIVE
Urobilinogen, UA: 0.2

## 2011-03-26 LAB — PREGNANCY, URINE: Preg Test, Ur: NEGATIVE

## 2011-03-27 LAB — CBC
HCT: 38.6
Hemoglobin: 12.8
MCHC: 33.2
Platelets: 188
RDW: 15.5

## 2011-03-27 LAB — DIFFERENTIAL
Basophils Absolute: 0
Eosinophils Absolute: 0.1
Eosinophils Relative: 2
Lymphocytes Relative: 34
Lymphs Abs: 2
Monocytes Absolute: 0.2

## 2011-03-27 LAB — BASIC METABOLIC PANEL
BUN: 7
CO2: 28
Calcium: 9.2
GFR calc non Af Amer: >60
Glucose, Bld: 90
Potassium: 3.7

## 2011-03-29 ENCOUNTER — Emergency Department (HOSPITAL_COMMUNITY)
Admission: EM | Admit: 2011-03-29 | Discharge: 2011-03-29 | Disposition: A | Discharge status: Home / Self Care | Payer: Self-pay | Attending: Emergency Medicine | Admitting: Emergency Medicine

## 2011-03-29 ENCOUNTER — Encounter: Payer: Self-pay | Admitting: *Deleted

## 2011-03-29 DIAGNOSIS — F17200 Nicotine dependence, unspecified, uncomplicated: Secondary | ICD-10-CM | POA: Insufficient documentation

## 2011-03-29 DIAGNOSIS — K0889 Other specified disorders of teeth and supporting structures: Secondary | ICD-10-CM

## 2011-03-29 DIAGNOSIS — F319 Bipolar disorder, unspecified: Secondary | ICD-10-CM | POA: Insufficient documentation

## 2011-03-29 DIAGNOSIS — K029 Dental caries, unspecified: Secondary | ICD-10-CM | POA: Insufficient documentation

## 2011-03-29 DIAGNOSIS — R569 Unspecified convulsions: Secondary | ICD-10-CM | POA: Insufficient documentation

## 2011-03-29 DIAGNOSIS — J45909 Unspecified asthma, uncomplicated: Secondary | ICD-10-CM | POA: Insufficient documentation

## 2011-03-29 DIAGNOSIS — K089 Disorder of teeth and supporting structures, unspecified: Secondary | ICD-10-CM | POA: Insufficient documentation

## 2011-03-29 DIAGNOSIS — K047 Periapical abscess without sinus: Secondary | ICD-10-CM | POA: Insufficient documentation

## 2011-03-29 HISTORY — DX: Unspecified convulsions: R56.9

## 2011-03-29 HISTORY — DX: Bipolar disorder, unspecified: F31.9

## 2011-03-29 MED ORDER — CLINDAMYCIN HCL 150 MG PO CAPS: ORAL_CAPSULE | ORAL | Status: DC

## 2011-03-29 MED ORDER — HYDROCODONE-ACETAMINOPHEN 5-325 MG PO TABS: 1.0000 | ORAL_TABLET | Freq: Four times a day (QID) | ORAL | Status: AC | PRN

## 2011-03-29 MED ORDER — HYDROCODONE-ACETAMINOPHEN 5-325 MG PO TABS
1.0000 | ORAL_TABLET | Freq: Once | ORAL | Status: AC
  Administered 2011-03-29: 1 via ORAL
  Filled 2011-03-29: qty 1

## 2011-03-29 MED ORDER — CLINDAMYCIN HCL 150 MG PO CAPS
300.0000 mg | ORAL_CAPSULE | Freq: Once | ORAL | Status: AC
  Administered 2011-03-29: 300 mg via ORAL
  Filled 2011-03-29: qty 2

## 2011-03-29 MED ORDER — IBUPROFEN 800 MG PO TABS
800.0000 mg | ORAL_TABLET | Freq: Once | ORAL | Status: AC
  Administered 2011-03-29: 800 mg via ORAL
  Filled 2011-03-29: qty 1

## 2011-03-29 NOTE — ED Notes (Signed)
Pt reports toothache on right side, x 2 days.  States she is unable to be seen by health department until mid-oct.

## 2011-03-29 NOTE — ED Provider Notes (Signed)
History     CSN: [id number] Arrival date & time: 03/29/2011  9:41 PM  Chief Complaint  Patient presents with  . Dental Pain    (Consider location/radiation/quality/duration/timing/severity/associated sxs/prior treatment) Patient is a 30 y.o. female presenting with tooth pain. The history is provided by the patient. No language interpreter was used.  Dental PainThe primary symptoms include mouth pain and fever. The symptoms began 2 days ago. The symptoms are worsening. The symptoms are new.  The fever began yesterday. The maximum temperature recorded prior to her arrival was 101 to 101.9 F.  Additional symptoms include: dental sensitivity to temperature. Additional symptoms do not include: jaw pain and facial swelling.    Past Medical History  Diagnosis Date  . Seizures   . Bipolar 1 disorder   . Asthma     Past Surgical History  Procedure Date  . Cholecystectomy   . Abdominal hysterectomy     History reviewed. No pertinent family history.  History  Substance Use Topics  . Smoking status: Current Everyday Smoker -- 1.0 packs/day  . Smokeless tobacco: Not on file  . Alcohol Use: 1.2 oz/week    2 Cans of beer per week    OB History    Grav Para Term Preterm Abortions TAB SAB Ect Mult Living                  Review of Systems  Constitutional: Positive for fever.  HENT: Positive for dental problem. Negative for facial swelling.   All other systems reviewed and are negative.    Allergies  Metronidazole; Nsaids; Penicillins; Propoxyphene n-acetaminophen; and Rocephin  Home Medications   Current Outpatient Rx  Name Route Sig Dispense Refill  . CARBAMAZEPINE 100 MG PO CHEW Oral Chew 150 mg by mouth 3 (three) times daily.      Marland Kitchen GABAPENTIN 300 MG PO CAPS Oral Take 300 mg by mouth 3 (three) times daily.        BP 115/61  Pulse 89  Temp(Src) 97.8 F (36.6 C) (Oral)  Resp 16  Ht 5\' 8"  (1.727 m)  Wt 141 lb (63.957 kg)  BMI 21.44 kg/m2  SpO2 100%  Physical  Exam  Nursing note and vitals reviewed. Constitutional: She is oriented to person, place, and time. She appears well-developed and well-nourished. No distress.  HENT:  Head: Normocephalic and atraumatic.  Mouth/Throat: Abnormal dentition. Dental abscesses and dental caries present.         Dentition poor  Eyes: EOM are normal.  Neck: Normal range of motion.  Cardiovascular: Normal rate, regular rhythm and normal heart sounds.   Pulmonary/Chest: Effort normal and breath sounds normal.  Abdominal: Soft. She exhibits no distension. There is no tenderness.  Musculoskeletal: Normal range of motion.  Neurological: She is alert and oriented to person, place, and time.  Skin: Skin is warm and dry.  Psychiatric: She has a normal mood and affect. Judgment normal.    ED Course  Procedures (including critical care time)  Labs Reviewed - No data to display No results found.   No diagnosis found.    MDM          Worthy Rancher, PA 03/29/11 2216

## 2011-03-29 NOTE — ED Notes (Signed)
Attempted to call once for triage.  No response.

## 2011-03-30 NOTE — ED Provider Notes (Signed)
Medical screening examination/treatment/procedure(s) were performed by non-physician practitioner and as supervising physician I was immediately available for consultation/collaboration.  Frankie Zito L Lisel Siegrist, MD 03/30/11 0123 

## 2011-04-03 LAB — DIFFERENTIAL
Basophils Relative: 0 % (ref 0–1)
Eosinophils Absolute: 0.1 10*3/uL (ref 0.0–0.7)
Eosinophils Relative: 1 % (ref 0–5)
Monocytes Absolute: 0.3 10*3/uL (ref 0.1–1.0)
Monocytes Relative: 3 % (ref 3–12)

## 2011-04-03 LAB — CBC
Hemoglobin: 10.6 g/dL — ABNORMAL LOW (ref 12.0–15.0)
MCHC: 33.6 g/dL (ref 30.0–36.0)
MCV: 92.8 fL (ref 78.0–100.0)
RBC: 3.38 MIL/uL — ABNORMAL LOW (ref 3.87–5.11)
RDW: 13.9 % (ref 11.5–15.5)

## 2011-04-03 LAB — CULTURE, BLOOD (ROUTINE X 2)
Culture: NO GROWTH
Culture: NO GROWTH
Report Status: 12162009

## 2011-04-03 LAB — BASIC METABOLIC PANEL
CO2: 25 mEq/L (ref 19–32)
Chloride: 107 mEq/L (ref 96–112)
GFR calc Af Amer: >60 mL/min (ref >60)
Glucose, Bld: 120 mg/dL — ABNORMAL HIGH (ref 70–99)
Sodium: 138 mEq/L (ref 135–145)

## 2011-04-03 LAB — URINALYSIS, ROUTINE W REFLEX MICROSCOPIC
Bilirubin Urine: NEGATIVE
Glucose, UA: NEGATIVE mg/dL
Hgb urine dipstick: NEGATIVE
Ketones, ur: NEGATIVE mg/dL
pH: 6 (ref 5.0–8.0)

## 2011-04-04 LAB — BASIC METABOLIC PANEL
GFR calc Af Amer: >60
GFR calc non Af Amer: >60
Potassium: 3.7
Sodium: 139

## 2011-04-04 LAB — DIFFERENTIAL
Eosinophils Relative: 1
Lymphocytes Relative: 20
Lymphs Abs: 2.5
Monocytes Absolute: 0.5

## 2011-04-04 LAB — CBC
HCT: 36.8
Hemoglobin: 12.2
WBC: 12.8 — ABNORMAL HIGH

## 2011-04-04 LAB — URINE MICROSCOPIC-ADD ON

## 2011-04-04 LAB — URINALYSIS, ROUTINE W REFLEX MICROSCOPIC
Glucose, UA: NEGATIVE
Glucose, UA: NEGATIVE
Hgb urine dipstick: NEGATIVE
Hgb urine dipstick: NEGATIVE
Protein, ur: NEGATIVE
Specific Gravity, Urine: 1.01

## 2011-04-04 LAB — RAPID URINE DRUG SCREEN, HOSP PERFORMED
Barbiturates: NOT DETECTED
Benzodiazepines: NOT DETECTED

## 2011-04-04 LAB — GC/CHLAMYDIA PROBE AMP, GENITAL: GC Probe Amp, Genital: NEGATIVE

## 2011-04-08 LAB — POCT PREGNANCY, URINE: Preg Test, Ur: NEGATIVE

## 2011-04-17 LAB — I-STAT 8, (EC8 V) (CONVERTED LAB)
BUN: 6
Bicarbonate: 22.9
HCT: 37
Hemoglobin: 12.6
Operator id: 166561
Sodium: 140
pCO2, Ven: 32.2 — ABNORMAL LOW

## 2011-04-17 LAB — CBC
Hemoglobin: 11.5 — ABNORMAL LOW
RBC: 3.7 — ABNORMAL LOW
RDW: 15.4 — ABNORMAL HIGH

## 2011-04-17 LAB — BASIC METABOLIC PANEL
Calcium: 9.3
Creatinine, Ser: 0.68
GFR calc Af Amer: >60
GFR calc non Af Amer: >60
Sodium: 138

## 2011-04-17 LAB — HCG, QUANTITATIVE, PREGNANCY: hCG, Beta Chain, Quant, S: 3

## 2011-05-17 ENCOUNTER — Encounter (HOSPITAL_COMMUNITY): Payer: Self-pay

## 2011-05-17 ENCOUNTER — Emergency Department (HOSPITAL_COMMUNITY)
Admission: EM | Admit: 2011-05-17 | Discharge: 2011-05-17 | Disposition: A | Discharge status: Home / Self Care | Payer: Self-pay | Attending: Emergency Medicine | Admitting: Emergency Medicine

## 2011-05-17 DIAGNOSIS — R059 Cough, unspecified: Secondary | ICD-10-CM | POA: Insufficient documentation

## 2011-05-17 DIAGNOSIS — J069 Acute upper respiratory infection, unspecified: Secondary | ICD-10-CM | POA: Insufficient documentation

## 2011-05-17 DIAGNOSIS — R05 Cough: Secondary | ICD-10-CM | POA: Insufficient documentation

## 2011-05-17 DIAGNOSIS — J3489 Other specified disorders of nose and nasal sinuses: Secondary | ICD-10-CM | POA: Insufficient documentation

## 2011-05-17 DIAGNOSIS — R112 Nausea with vomiting, unspecified: Secondary | ICD-10-CM | POA: Insufficient documentation

## 2011-05-17 DIAGNOSIS — J40 Bronchitis, not specified as acute or chronic: Secondary | ICD-10-CM | POA: Insufficient documentation

## 2011-05-17 DIAGNOSIS — R6883 Chills (without fever): Secondary | ICD-10-CM | POA: Insufficient documentation

## 2011-05-17 DIAGNOSIS — F17200 Nicotine dependence, unspecified, uncomplicated: Secondary | ICD-10-CM | POA: Insufficient documentation

## 2011-05-17 MED ORDER — ALBUTEROL SULFATE HFA 108 (90 BASE) MCG/ACT IN AERS
2.0000 | INHALATION_SPRAY | RESPIRATORY_TRACT | Status: DC
  Administered 2011-05-17: 2 via RESPIRATORY_TRACT
  Filled 2011-05-17 (×2): qty 6.7

## 2011-05-17 MED ORDER — AEROCHAMBER PLUS W/MASK MISC
1.0000 | Freq: Once | Status: AC
  Administered 2011-05-17: 1

## 2011-05-17 MED ORDER — ACETAMINOPHEN 500 MG PO TABS
1000.0000 mg | ORAL_TABLET | Freq: Once | ORAL | Status: AC
  Administered 2011-05-17: 1000 mg via ORAL
  Filled 2011-05-17: qty 2

## 2011-05-17 MED ORDER — PROMETHAZINE-CODEINE 6.25-10 MG/5ML PO SYRP: 5.0000 mL | ORAL_SOLUTION | ORAL | Status: AC | PRN

## 2011-05-17 MED ORDER — PREDNISONE 20 MG PO TABS
60.0000 mg | ORAL_TABLET | Freq: Every day | ORAL | Status: DC
  Administered 2011-05-17: 60 mg via ORAL
  Filled 2011-05-17: qty 3

## 2011-05-17 MED ORDER — PREDNISONE 10 MG PO TABS: 20.0000 mg | ORAL_TABLET | Freq: Every day | ORAL | Status: AC

## 2011-05-17 MED ORDER — ONDANSETRON 4 MG PO TBDP
4.0000 mg | ORAL_TABLET | Freq: Once | ORAL | Status: AC
  Administered 2011-05-17: 4 mg via ORAL
  Filled 2011-05-17: qty 1

## 2011-05-17 NOTE — ED Provider Notes (Signed)
History     CSN: [id number] Arrival date & time: 05/17/2011  9:06 AM   First MD Initiated Contact with Patient 05/17/11 [phone]      Chief Complaint  Patient presents with  . URI    (Consider location/radiation/quality/duration/timing/severity/associated sxs/prior treatment) Patient is a 30 y.o. female presenting with URI. The history is provided by the patient.  URI The primary symptoms include headaches, cough, nausea and vomiting. Primary symptoms do not include wheezing, abdominal pain or arthralgias. The current episode started yesterday. This is a new problem.  The headache is not associated with photophobia.  Symptoms associated with the illness include chills, sinus pressure and congestion.    Past Medical History  Diagnosis Date  . Seizures   . Bipolar 1 disorder   . Asthma     Past Surgical History  Procedure Date  . Cholecystectomy   . Abdominal hysterectomy     No family history on file.  History  Substance Use Topics  . Smoking status: Current Everyday Smoker -- 1.0 packs/day  . Smokeless tobacco: Not on file  . Alcohol Use: 1.2 oz/week    2 Cans of beer per week    OB History    Grav Para Term Preterm Abortions TAB SAB Ect Mult Living                  Review of Systems  Constitutional: Positive for chills. Negative for activity change.       All ROS Neg except as noted in HPI  HENT: Positive for congestion and sinus pressure. Negative for nosebleeds and neck pain.   Eyes: Negative for photophobia and discharge.  Respiratory: Positive for cough. Negative for shortness of breath and wheezing.   Cardiovascular: Negative for chest pain and palpitations.  Gastrointestinal: Positive for nausea and vomiting. Negative for abdominal pain and blood in stool.  Genitourinary: Negative for dysuria, frequency and hematuria.  Musculoskeletal: Negative for back pain and arthralgias.  Skin: Negative.   Neurological: Positive for headaches. Negative for  dizziness, seizures and speech difficulty.  Psychiatric/Behavioral: Negative for hallucinations and confusion.    Allergies  Metronidazole; Nsaids; Penicillins; Propoxyphene n-acetaminophen; and Rocephin  Home Medications   Current Outpatient Rx  Name Route Sig Dispense Refill  . CARBAMAZEPINE 100 MG PO CHEW Oral Chew 150 mg by mouth 3 (three) times daily.      Marland Kitchen CLINDAMYCIN HCL 150 MG PO CAPS  2 po TID 60 capsule 0  . GABAPENTIN 300 MG PO CAPS Oral Take 300 mg by mouth 3 (three) times daily.        BP 134/86  Pulse 89  Temp(Src) 98.1 F (36.7 C) (Oral)  Resp 18  Ht 5\' 7"  (1.702 m)  Wt 140 lb (63.504 kg)  BMI 21.93 kg/m2  SpO2 98%  Physical Exam  Nursing note and vitals reviewed. Constitutional: She is oriented to person, place, and time. She appears well-developed and well-nourished.  Non-toxic appearance.  HENT:  Head: Normocephalic.  Right Ear: Tympanic membrane and external ear normal.  Left Ear: Tympanic membrane and external ear normal.       Mild to mod increase redness of the posterior pharynx. Nasal congestion an sinus tenderness present.  Eyes: EOM and lids are normal. Pupils are equal, round, and reactive to light.  Neck: Normal range of motion. Neck supple. Carotid bruit is not present.  Cardiovascular: Normal rate, regular rhythm, intact distal pulses and normal pulses.   Murmur heard. Pulmonary/Chest: No respiratory distress. She has wheezes.  She has rhonchi.  Abdominal: Soft. Bowel sounds are normal. There is no tenderness. There is no guarding.  Musculoskeletal: Normal range of motion.  Lymphadenopathy:       Head (right side): No submandibular adenopathy present.       Head (left side): No submandibular adenopathy present.    She has no cervical adenopathy.  Neurological: She is alert and oriented to person, place, and time. She has normal strength. No cranial nerve deficit or sensory deficit.  Skin: Skin is warm and dry.  Psychiatric: She has a normal  mood and affect. Her speech is normal.    ED Course  Procedures (including critical care time)  Labs Reviewed - No data to display No results found.   No diagnosis found.    MDM  I have reviewed nursing notes, vital signs, and all appropriate lab and imaging results for this patient.        Kathie Dike, [place] 05/18/11 [phone]

## 2011-05-17 NOTE — ED Notes (Signed)
Pt reports pressure in sinuses  For the past 3 days.  Also c/o n/v/d since yesterday.

## 2011-05-19 NOTE — ED Provider Notes (Signed)
Medical screening examination/treatment/procedure(s) were performed by non-physician practitioner and as supervising physician I was immediately available for consultation/collaboration.   Shelda Jakes, MD 05/19/11 1409

## 2011-06-02 ENCOUNTER — Emergency Department (HOSPITAL_COMMUNITY): Payer: Self-pay

## 2011-06-02 ENCOUNTER — Encounter (HOSPITAL_COMMUNITY): Payer: Self-pay

## 2011-06-02 ENCOUNTER — Emergency Department (HOSPITAL_COMMUNITY)
Admission: EM | Admit: 2011-06-02 | Discharge: 2011-06-02 | Disposition: A | Discharge status: Home / Self Care | Payer: Self-pay | Attending: Emergency Medicine | Admitting: Emergency Medicine

## 2011-06-02 DIAGNOSIS — F17200 Nicotine dependence, unspecified, uncomplicated: Secondary | ICD-10-CM | POA: Insufficient documentation

## 2011-06-02 DIAGNOSIS — J45909 Unspecified asthma, uncomplicated: Secondary | ICD-10-CM | POA: Insufficient documentation

## 2011-06-02 DIAGNOSIS — Z9079 Acquired absence of other genital organ(s): Secondary | ICD-10-CM | POA: Insufficient documentation

## 2011-06-02 DIAGNOSIS — R569 Unspecified convulsions: Secondary | ICD-10-CM | POA: Insufficient documentation

## 2011-06-02 DIAGNOSIS — Z9889 Other specified postprocedural states: Secondary | ICD-10-CM | POA: Insufficient documentation

## 2011-06-02 DIAGNOSIS — J40 Bronchitis, not specified as acute or chronic: Secondary | ICD-10-CM | POA: Insufficient documentation

## 2011-06-02 DIAGNOSIS — F319 Bipolar disorder, unspecified: Secondary | ICD-10-CM | POA: Insufficient documentation

## 2011-06-02 HISTORY — DX: Other psychoactive substance abuse, uncomplicated: F19.10

## 2011-06-02 LAB — URINALYSIS, ROUTINE W REFLEX MICROSCOPIC
Bilirubin Urine: NEGATIVE
Glucose, UA: NEGATIVE mg/dL
Hgb urine dipstick: NEGATIVE
Ketones, ur: NEGATIVE mg/dL
Leukocytes, UA: NEGATIVE
Nitrite: NEGATIVE
Protein, ur: NEGATIVE mg/dL
Specific Gravity, Urine: <1.005 — ABNORMAL LOW (ref 1.005–1.030)
Urobilinogen, UA: 0.2 mg/dL (ref 0.0–1.0)
pH: 6 (ref 5.0–8.0)

## 2011-06-02 MED ORDER — DOXYCYCLINE HYCLATE 100 MG PO TABS
100.0000 mg | ORAL_TABLET | Freq: Once | ORAL | Status: AC
  Administered 2011-06-02: 100 mg via ORAL
  Filled 2011-06-02: qty 1

## 2011-06-02 MED ORDER — SODIUM CHLORIDE 0.9 % IV BOLUS (SEPSIS): 1000.0000 mL | Freq: Once | INTRAVENOUS | Status: DC

## 2011-06-02 MED ORDER — DOXYCYCLINE HYCLATE 100 MG PO CAPS: 100.0000 mg | ORAL_CAPSULE | Freq: Two times a day (BID) | ORAL | Status: AC

## 2011-06-02 MED ORDER — ONDANSETRON 4 MG PO TBDP
ORAL_TABLET | ORAL | Status: AC
  Administered 2011-06-02: 4 mg
  Filled 2011-06-02: qty 1

## 2011-06-02 MED ORDER — ALBUTEROL SULFATE HFA 108 (90 BASE) MCG/ACT IN AERS
2.0000 | INHALATION_SPRAY | RESPIRATORY_TRACT | Status: DC | PRN
  Administered 2011-06-02: 2 via RESPIRATORY_TRACT
  Filled 2011-06-02: qty 6.7

## 2011-06-02 MED ORDER — ONDANSETRON HCL 4 MG PO TABS: 4.0000 mg | ORAL_TABLET | Freq: Three times a day (TID) | ORAL | Status: AC | PRN

## 2011-06-02 MED ORDER — ONDANSETRON HCL 4 MG/2ML IJ SOLN: 4.0000 mg | Freq: Once | INTRAMUSCULAR | Status: DC

## 2011-06-02 NOTE — ED Notes (Signed)
Pt sick for over a week w. Cough.cold--hard to breath, also having low back pain--causing numbness down legs

## 2011-06-02 NOTE — ED Provider Notes (Signed)
History     CSN: [id number] Arrival date & time: 06/02/2011  3:46 AM   First MD Initiated Contact with Patient 06/02/11 [phone]      Chief complaint: Cough and  (Consider location/radiation/quality/duration/timing/severity/associated sxs/prior treatment) HPI This is a 30 year old white female with a one-week history of cough, shortness of breath and fever to 104. She states her symptoms are not improving. She is also complaining of generalized pain, low back pain radiating to her legs, nausea, vomiting, diarrhea and generalized weakness.  The symptoms are moderate.   Past Medical History  Diagnosis Date  . Seizures   . Bipolar 1 disorder   . Asthma     Past Surgical History  Procedure Date  . Cholecystectomy   . Abdominal hysterectomy     No family history on file.  History  Substance Use Topics  . Smoking status: Current Everyday Smoker -- 1.0 packs/day  . Smokeless tobacco: Not on file  . Alcohol Use: 1.2 oz/week    2 Cans of beer per week    OB History    Grav Para Term Preterm Abortions TAB SAB Ect Mult Living                  Review of Systems  All other systems reviewed and are negative.    Allergies  Metronidazole; Nsaids; Penicillins; Propoxyphene n-acetaminophen; and Rocephin  Home Medications   Current Outpatient Rx  Name Route Sig Dispense Refill  . CARBAMAZEPINE 100 MG PO CHEW Oral Chew 150 mg by mouth 3 (three) times daily.      Marland Kitchen GABAPENTIN 300 MG PO CAPS Oral Take 300 mg by mouth 3 (three) times daily.        BP 112/70  Pulse 113  Temp(Src) 97.8 F (36.6 C) (Oral)  Resp 22  Ht 5\' 7"  (1.702 m)  Wt 150 lb (68.04 kg)  BMI 23.49 kg/m2  SpO2 99%  Physical Exam General: Well-developed, well-nourished female in no acute distress; appearance consistent with age of record; sleepy but easily aroused HENT: normocephalic, atraumatic; poor dentition; oral mucosa moist Eyes: pupils equal round and reactive to light; extraocular muscles  intact Neck: supple Heart: regular rate and rhythm Lungs: clear to auscultation bilaterally; rattly cough Abdomen: soft; nontender; nondistended; bowel sounds present Extremities: No deformity; full range of motion; pulses normal; no edema Neurologic: Awake, alert; motor function intact in all extremities and symmetric; no facial droop Skin: Warm and dry Psychiatric: Flat affect    ED Course  Procedures (including critical care time)    MDM   Nursing notes and vitals signs, including pulse oximetry, reviewed.  Summary of this visit's results, reviewed by myself:  Labs:  Results for orders placed during the hospital encounter of 06/02/11  URINALYSIS, ROUTINE W REFLEX MICROSCOPIC      Component Value Range   Color, Urine YELLOW  YELLOW    APPearance CLEAR  CLEAR    Specific Gravity, Urine <1.005 (*) 1.005 - 1.030    pH 6.0  5.0 - 8.0    Glucose, UA NEGATIVE  NEGATIVE (mg/dL)   Hgb urine dipstick NEGATIVE  NEGATIVE    Bilirubin Urine NEGATIVE  NEGATIVE    Ketones, ur NEGATIVE  NEGATIVE (mg/dL)   Protein, ur NEGATIVE  NEGATIVE (mg/dL)   Urobilinogen, UA 0.2  0.0 - 1.0 (mg/dL)   Nitrite NEGATIVE  NEGATIVE    Leukocytes, UA NEGATIVE  NEGATIVE   PREGNANCY, URINE      Component Value Range   Preg Test,  Ur NEGATIVE     6:54 AM Per nursing staff patient has a long-standing history of drug abuse. We'll treat for bronchitis.         Hanley Seamen, MD 06/02/11 [phone]

## 2011-06-02 NOTE — ED Notes (Signed)
Inhaler instructions given by resp therapy. Pt discharged at this time.

## 2011-06-02 NOTE — ED Notes (Signed)
Patient given pi fluids

## 2011-06-07 ENCOUNTER — Emergency Department (HOSPITAL_COMMUNITY)
Admission: EM | Admit: 2011-06-07 | Discharge: 2011-06-07 | Disposition: A | Discharge status: Home / Self Care | Payer: Self-pay | Attending: Emergency Medicine | Admitting: Emergency Medicine

## 2011-06-07 ENCOUNTER — Encounter (HOSPITAL_COMMUNITY): Payer: Self-pay | Admitting: *Deleted

## 2011-06-07 DIAGNOSIS — F1910 Other psychoactive substance abuse, uncomplicated: Secondary | ICD-10-CM | POA: Insufficient documentation

## 2011-06-07 DIAGNOSIS — J019 Acute sinusitis, unspecified: Secondary | ICD-10-CM | POA: Insufficient documentation

## 2011-06-07 DIAGNOSIS — F17200 Nicotine dependence, unspecified, uncomplicated: Secondary | ICD-10-CM | POA: Insufficient documentation

## 2011-06-07 DIAGNOSIS — R569 Unspecified convulsions: Secondary | ICD-10-CM | POA: Insufficient documentation

## 2011-06-07 DIAGNOSIS — J45909 Unspecified asthma, uncomplicated: Secondary | ICD-10-CM | POA: Insufficient documentation

## 2011-06-07 DIAGNOSIS — F319 Bipolar disorder, unspecified: Secondary | ICD-10-CM | POA: Insufficient documentation

## 2011-06-07 MED ORDER — PSEUDOEPHEDRINE HCL 60 MG PO TABS: 60.0000 mg | ORAL_TABLET | ORAL | Status: AC | PRN

## 2011-06-07 MED ORDER — AZITHROMYCIN 250 MG PO TABS: 250.0000 mg | ORAL_TABLET | Freq: Every day | ORAL | Status: AC

## 2011-06-07 NOTE — ED Notes (Signed)
Pt presents with "coughing up blood and blowing blood out of nose". Pt also c/o chest discomfort with cough and lost 10 lbs in the past 2 weeks. Pt states she has been seen 3 times for same. NAD at this time. Pt stable.

## 2011-06-07 NOTE — ED Notes (Signed)
Pt a/ox4. Resp even and unlabored. NAD at this time. D/C instructions reviewed with pt. Pt verbalized understanding. Pt ambulated to lobby with steady gate.  

## 2011-06-07 NOTE — ED Notes (Signed)
Lost 10 lbs. In 2 weeks, coughing up blood, third time being seen for same, c/o chest hurting

## 2011-06-08 NOTE — ED Provider Notes (Signed)
History     CSN: [id number] Arrival date & time: 06/07/2011  3:38 PM   First MD Initiated Contact with Patient 06/07/11 1540      Chief Complaint  Patient presents with  . Hemoptysis    (Consider location/radiation/quality/duration/timing/severity/associated sxs/prior treatment) HPI Comments: Patient presents with a 2 week history of coughing up blood and nasal congestion with thick,  Purulent and bloody nasal discharge for the past 2 weeks.  She also reports burning midsternal chest pain with coughing.  She denies sore throat.  She was treated for bronchitis with doxycycline 5 days ago which has not improved her symptoms.   She also has complaint of an unintentional 10 pound weight loss in the past month,  But has had decreased appetite.  She denies abdominal pain,  Vomiting,  Diarrhea but has had subjective fevers.  She has found no alleviators for her symptoms.   The history is provided by the patient.    Past Medical History  Diagnosis Date  . Seizures   . Bipolar 1 disorder   . Asthma   . Substance abuse     Past Surgical History  Procedure Date  . Cholecystectomy   . Abdominal hysterectomy     History reviewed. No pertinent family history.  History  Substance Use Topics  . Smoking status: Current Everyday Smoker -- 1.0 packs/day  . Smokeless tobacco: Not on file  . Alcohol Use: 1.2 oz/week    2 Cans of beer per week    OB History    Grav Para Term Preterm Abortions TAB SAB Ect Mult Living                  Review of Systems  Constitutional: Positive for unexpected weight change. Negative for fever.  HENT: Positive for congestion, postnasal drip and sinus pressure. Negative for nosebleeds, sore throat and neck pain.   Eyes: Negative.   Respiratory: Positive for cough. Negative for chest tightness, shortness of breath and wheezing.   Cardiovascular: Negative for chest pain.  Gastrointestinal: Negative for nausea and abdominal pain.  Genitourinary: Negative.    Musculoskeletal: Negative for joint swelling and arthralgias.  Skin: Negative.  Negative for rash and wound.  Neurological: Negative for dizziness, weakness, light-headedness, numbness and headaches.  Hematological: Negative.   Psychiatric/Behavioral: Negative.     Allergies  Metronidazole; Nsaids; Penicillins; Propoxyphene n-acetaminophen; and Rocephin  Home Medications   Current Outpatient Rx  Name Route Sig Dispense Refill  . AZITHROMYCIN 250 MG PO TABS Oral Take 1 tablet (250 mg total) by mouth daily. Take first 2 tablets together, then 1 every day until finished. 6 tablet 0  . CARBAMAZEPINE 100 MG PO CHEW Oral Chew 150 mg by mouth 3 (three) times daily.      Marland Kitchen DOXYCYCLINE HYCLATE 100 MG PO CAPS Oral Take 1 capsule (100 mg total) by mouth 2 (two) times daily. 20 capsule 0  . GABAPENTIN 300 MG PO CAPS Oral Take 300 mg by mouth 3 (three) times daily.      Marland Kitchen ONDANSETRON HCL 4 MG PO TABS Oral Take 1 tablet (4 mg total) by mouth every 8 (eight) hours as needed for nausea. 10 tablet 0  . PSEUDOEPHEDRINE HCL 60 MG PO TABS Oral Take 1 tablet (60 mg total) by mouth every 4 (four) hours as needed for congestion. 30 tablet 0    BP 119/67  Pulse 103  Temp(Src) 97.6 F (36.4 C) (Oral)  Resp 20  Ht 5\' 7"  (1.702 m)  Wt  130 lb (58.968 kg)  BMI 20.36 kg/m2  SpO2 100%  Physical Exam  Nursing note and vitals reviewed. Constitutional: She is oriented to person, place, and time. She appears well-developed and well-nourished.  HENT:  Head: Normocephalic and atraumatic.  Right Ear: Tympanic membrane normal.  Left Ear: Tympanic membrane normal.  Nose: Mucosal edema and rhinorrhea present. Right sinus exhibits maxillary sinus tenderness. Left sinus exhibits maxillary sinus tenderness.  Mouth/Throat: Uvula is midline, oropharynx is clear and moist and mucous membranes are normal.       Purulent nasal drainage.  Eyes: Conjunctivae are normal.  Neck: Normal range of motion.  Cardiovascular:  Normal rate, regular rhythm, normal heart sounds and intact distal pulses.   Pulmonary/Chest: Effort normal and breath sounds normal. She has no wheezes.  Abdominal: Soft. Bowel sounds are normal. There is no tenderness.  Musculoskeletal: Normal range of motion.  Neurological: She is alert and oriented to person, place, and time.  Skin: Skin is warm and dry.  Psychiatric: She has a normal mood and affect.    ED Course  Procedures (including critical care time)  Labs Reviewed - No data to display No results found.   1. Sinusitis acute      MDM  Symptoms and exam consistent with acute sinusitis.  Normal lung exam with no rhonchi.  CXR done 06/02/11,  Reviewed and normal,  Not repeated today.  Reviewed weights over last 3 visits,  140#, then 150#,  Now 130# since 05/17/11 - suspect erroneois data,  Not accurate weights.        Candis Musa, PA 06/08/11 2302

## 2011-06-08 NOTE — ED Provider Notes (Signed)
Evaluation and management procedures were performed by the PA/NP under my supervision/collaboration.    Felisa Bonier, MD 06/08/11 [phone]

## 2011-06-09 ENCOUNTER — Emergency Department (HOSPITAL_COMMUNITY)
Admission: EM | Admit: 2011-06-09 | Discharge: 2011-06-09 | Discharge status: Against Medical Advice | Payer: Self-pay | Attending: Emergency Medicine | Admitting: Emergency Medicine

## 2011-06-09 ENCOUNTER — Encounter (HOSPITAL_COMMUNITY): Payer: Self-pay | Admitting: *Deleted

## 2011-06-09 DIAGNOSIS — R112 Nausea with vomiting, unspecified: Secondary | ICD-10-CM | POA: Insufficient documentation

## 2011-06-09 DIAGNOSIS — R05 Cough: Secondary | ICD-10-CM | POA: Insufficient documentation

## 2011-06-09 DIAGNOSIS — R197 Diarrhea, unspecified: Secondary | ICD-10-CM | POA: Insufficient documentation

## 2011-06-09 DIAGNOSIS — R059 Cough, unspecified: Secondary | ICD-10-CM | POA: Insufficient documentation

## 2011-06-09 NOTE — ED Notes (Signed)
Cough, nvd

## 2011-08-02 ENCOUNTER — Encounter (HOSPITAL_COMMUNITY): Payer: Self-pay | Admitting: *Deleted

## 2011-08-02 ENCOUNTER — Emergency Department (HOSPITAL_COMMUNITY)
Admission: EM | Admit: 2011-08-02 | Discharge: 2011-08-02 | Disposition: A | Discharge status: Home / Self Care | Payer: Self-pay | Attending: Emergency Medicine | Admitting: Emergency Medicine

## 2011-08-02 ENCOUNTER — Emergency Department (HOSPITAL_COMMUNITY): Payer: Self-pay

## 2011-08-02 DIAGNOSIS — R110 Nausea: Secondary | ICD-10-CM | POA: Insufficient documentation

## 2011-08-02 DIAGNOSIS — Z9889 Other specified postprocedural states: Secondary | ICD-10-CM | POA: Insufficient documentation

## 2011-08-02 DIAGNOSIS — Z9079 Acquired absence of other genital organ(s): Secondary | ICD-10-CM | POA: Insufficient documentation

## 2011-08-02 DIAGNOSIS — F319 Bipolar disorder, unspecified: Secondary | ICD-10-CM | POA: Insufficient documentation

## 2011-08-02 DIAGNOSIS — R569 Unspecified convulsions: Secondary | ICD-10-CM | POA: Insufficient documentation

## 2011-08-02 DIAGNOSIS — F17200 Nicotine dependence, unspecified, uncomplicated: Secondary | ICD-10-CM | POA: Insufficient documentation

## 2011-08-02 DIAGNOSIS — R141 Gas pain: Secondary | ICD-10-CM | POA: Insufficient documentation

## 2011-08-02 DIAGNOSIS — J45909 Unspecified asthma, uncomplicated: Secondary | ICD-10-CM | POA: Insufficient documentation

## 2011-08-02 DIAGNOSIS — K625 Hemorrhage of anus and rectum: Secondary | ICD-10-CM | POA: Insufficient documentation

## 2011-08-02 DIAGNOSIS — R142 Eructation: Secondary | ICD-10-CM | POA: Insufficient documentation

## 2011-08-02 DIAGNOSIS — K6289 Other specified diseases of anus and rectum: Secondary | ICD-10-CM | POA: Insufficient documentation

## 2011-08-02 DIAGNOSIS — R10819 Abdominal tenderness, unspecified site: Secondary | ICD-10-CM | POA: Insufficient documentation

## 2011-08-02 DIAGNOSIS — R109 Unspecified abdominal pain: Secondary | ICD-10-CM | POA: Insufficient documentation

## 2011-08-02 LAB — DIFFERENTIAL
Basophils Relative: 0 % (ref 0–1)
Eosinophils Absolute: 0.4 10*3/uL (ref 0.0–0.7)
Eosinophils Relative: 5 % (ref 0–5)
Lymphs Abs: 2.4 10*3/uL (ref 0.7–4.0)
Monocytes Absolute: 0.5 10*3/uL (ref 0.1–1.0)
Monocytes Relative: 6 % (ref 3–12)
Neutrophils Relative %: 58 % (ref 43–77)

## 2011-08-02 LAB — CBC
HCT: 40.9 % (ref 36.0–46.0)
Hemoglobin: 13.8 g/dL (ref 12.0–15.0)
MCH: 31.8 pg (ref 26.0–34.0)
MCHC: 33.7 g/dL (ref 30.0–36.0)
MCV: 94.2 fL (ref 78.0–100.0)
RBC: 4.34 MIL/uL (ref 3.87–5.11)

## 2011-08-02 LAB — COMPREHENSIVE METABOLIC PANEL
Albumin: 4 g/dL (ref 3.5–5.2)
Alkaline Phosphatase: 88 U/L (ref 39–117)
BUN: 8 mg/dL (ref 6–23)
Creatinine, Ser: 0.72 mg/dL (ref 0.50–1.10)
GFR calc Af Amer: >90 mL/min (ref >90)
Glucose, Bld: 79 mg/dL (ref 70–99)
Potassium: 3.6 mEq/L (ref 3.5–5.1)
Total Protein: 7.8 g/dL (ref 6.0–8.3)

## 2011-08-02 MED ORDER — HYDROCODONE-ACETAMINOPHEN 5-325 MG PO TABS: 1.0000 | ORAL_TABLET | Freq: Four times a day (QID) | ORAL | Status: AC | PRN

## 2011-08-02 MED ORDER — HYDROMORPHONE HCL PF 1 MG/ML IJ SOLN
1.0000 mg | Freq: Once | INTRAMUSCULAR | Status: AC
  Administered 2011-08-02: 1 mg via INTRAVENOUS
  Filled 2011-08-02: qty 1

## 2011-08-02 MED ORDER — ONDANSETRON HCL 4 MG/2ML IJ SOLN
4.0000 mg | Freq: Once | INTRAMUSCULAR | Status: AC
  Administered 2011-08-02: 4 mg via INTRAVENOUS
  Filled 2011-08-02: qty 2

## 2011-08-02 MED ORDER — IOHEXOL 300 MG/ML  SOLN
100.0000 mL | Freq: Once | INTRAMUSCULAR | Status: AC | PRN
  Administered 2011-08-02: 100 mL via INTRAVENOUS

## 2011-08-02 MED ORDER — IOHEXOL 300 MG/ML  SOLN
40.0000 mL | Freq: Once | INTRAMUSCULAR | Status: AC | PRN
  Administered 2011-08-02: 40 mL via ORAL

## 2011-08-02 NOTE — ED Provider Notes (Signed)
History     CSN: [id number]  Arrival date & time 08/02/11  0002   First MD Initiated Contact with Patient 08/02/11 0025      Chief Complaint  Patient presents with  . Rectal Bleeding  . Abdominal Pain    (Consider location/radiation/quality/duration/timing/severity/associated sxs/prior treatment) HPI Comments: Patient presents with a one-week history of lower bilateral abdominal pain which describes as achy and crampy, but has worsened as the week progressed. She was [facility] and was evaluated for possible hemorrhoids, as she has rectal pain as well, but no hemorrhoids were found. This afternoon she had a seizure, as she was going down steps in her home and ended up falling at the bottom of the steps. She denies pain or injury from the fall, but within several hours of this incident her lower abdominal pain worsened and she has since developed rectal bleeding. She reports bright red blood in the toilet bowl x2 not accompanied by stool, and also soaked through 2 pads. She reports mild nausea without vomiting. No fevers or chills, no prior history of similar symptoms.  The history is provided by the patient.    Past Medical History  Diagnosis Date  . Seizures   . Bipolar 1 disorder   . Asthma   . Substance abuse     Past Surgical History  Procedure Date  . Cholecystectomy   . Abdominal hysterectomy   . Abdominal surgery     History reviewed. No pertinent family history.  History  Substance Use Topics  . Smoking status: Current Everyday Smoker -- 1.0 packs/day  . Smokeless tobacco: Not on file  . Alcohol Use: 1.2 oz/week    2 Cans of beer per week    OB History    Grav Para Term Preterm Abortions TAB SAB Ect Mult Living                  Review of Systems  Constitutional: Negative for fever.  HENT: Negative for congestion, sore throat and neck pain.   Eyes: Negative.   Respiratory: Negative for chest tightness and shortness  of breath.   Cardiovascular: Negative for chest pain.  Gastrointestinal: Positive for nausea, anal bleeding and rectal pain. Negative for vomiting and abdominal pain.  Genitourinary: Negative.  Negative for vaginal bleeding.  Musculoskeletal: Negative for joint swelling and arthralgias.  Skin: Negative.  Negative for rash and wound.  Neurological: Negative for dizziness, weakness, light-headedness, numbness and headaches.  Hematological: Negative.   Psychiatric/Behavioral: Negative.     Allergies  Metronidazole; Penicillins; Propoxyphene n-acetaminophen; and Rocephin  Home Medications   Current Outpatient Rx  Name Route Sig Dispense Refill  . IPRATROPIUM-ALBUTEROL 0.5-2.5 (3) MG/3ML IN SOLN Nebulization Take 3 mLs by nebulization.    Marland Kitchen CARBAMAZEPINE 100 MG PO CHEW Oral Chew 150 mg by mouth 3 (three) times daily.      Marland Kitchen GABAPENTIN 300 MG PO CAPS Oral Take 300 mg by mouth 3 (three) times daily.        BP 141/80  Pulse 110  Temp 97.8 F (36.6 C)  Resp 20  Ht 5\' 7"  (1.702 m)  Wt 148 lb (67.132 kg)  BMI 23.18 kg/m2  SpO2 100%  Physical Exam  Nursing note and vitals reviewed. Constitutional: She is oriented to person, place, and time. She appears well-developed and well-nourished.  HENT:  Head: Normocephalic and atraumatic.  Eyes: Conjunctivae are normal.  Neck: Normal range of motion.  Cardiovascular: Normal rate, regular  rhythm, normal heart sounds and intact distal pulses.   Pulmonary/Chest: Effort normal and breath sounds normal. She has no wheezes.  Abdominal: Soft. Bowel sounds are normal. She exhibits distension. There is tenderness. There is no rebound and no guarding.  Genitourinary:       No external hemorrhoids or appreciable internal hemorrhoids. No stool in rectum , mucoid residue is pink tinged, but not grossly bloody.  Musculoskeletal: Normal range of motion.  Neurological: She is alert and oriented to person, place, and time.  Skin: Skin is warm and dry.    Psychiatric: She has a normal mood and affect.    ED Course  Procedures (including critical care time)   Labs Reviewed  CBC  DIFFERENTIAL  COMPREHENSIVE METABOLIC PANEL   No results found.   No diagnosis found.  IV started,  Dilaudid 1 mg IV,  zofran 4 mg IV given.  Labs,  Abdominal/pelvic Ct scan ordered.  Discussed case with Dr Deretha Emory who will assume pts care.  MDM          Candis Musa, PA 08/02/11 1605

## 2011-08-02 NOTE — ED Notes (Signed)
Pt states she fell  Down 4 stairsearlier after a seizure and now states she is bleeding bright red blood from her rectum x 2hrs. Soaked 2 pads in 1 hr.

## 2011-08-02 NOTE — ED Provider Notes (Signed)
Medical screening examination/treatment/procedure(s) were conducted as a shared visit with non-physician practitioner(s) and myself.  I personally evaluated the patient during the encounter  Patient seen by me in discharge by May of patient the history of substance abuse in the past presents for rectal bleeding and abdominal pain CT scan negative labs normal no elevation in white blood cell count no significant abnormalities in hemoglobin and hematocrit. He states history of diverticulitis in the past CT scan shows no evidence of diverticulosis.  Patient will need a colonoscopy in the near future has followup with health department and has been seen by Dr. Darrick Penna from GI in the past. Will refer patient to Dr. Darrick Penna.  No sniffing and GI bleeding during the emergency department stay. Elevation in the pulse of 110 upon arrival to the ED is noted blood pressure is within normal heart rate is improved with pain medicine.  Shelda Jakes, MD 08/02/11 [phone]

## 2011-08-10 ENCOUNTER — Encounter (HOSPITAL_COMMUNITY): Payer: Self-pay

## 2011-08-10 ENCOUNTER — Emergency Department (HOSPITAL_COMMUNITY)
Admission: EM | Admit: 2011-08-10 | Discharge: 2011-08-10 | Disposition: A | Discharge status: Home / Self Care | Payer: Self-pay | Attending: Emergency Medicine | Admitting: Emergency Medicine

## 2011-08-10 DIAGNOSIS — R569 Unspecified convulsions: Secondary | ICD-10-CM | POA: Insufficient documentation

## 2011-08-10 DIAGNOSIS — F17200 Nicotine dependence, unspecified, uncomplicated: Secondary | ICD-10-CM | POA: Insufficient documentation

## 2011-08-10 DIAGNOSIS — J45909 Unspecified asthma, uncomplicated: Secondary | ICD-10-CM | POA: Insufficient documentation

## 2011-08-10 DIAGNOSIS — Z9889 Other specified postprocedural states: Secondary | ICD-10-CM | POA: Insufficient documentation

## 2011-08-10 DIAGNOSIS — R110 Nausea: Secondary | ICD-10-CM | POA: Insufficient documentation

## 2011-08-10 DIAGNOSIS — F319 Bipolar disorder, unspecified: Secondary | ICD-10-CM | POA: Insufficient documentation

## 2011-08-10 DIAGNOSIS — K922 Gastrointestinal hemorrhage, unspecified: Secondary | ICD-10-CM | POA: Insufficient documentation

## 2011-08-10 DIAGNOSIS — R05 Cough: Secondary | ICD-10-CM | POA: Insufficient documentation

## 2011-08-10 DIAGNOSIS — Z9079 Acquired absence of other genital organ(s): Secondary | ICD-10-CM | POA: Insufficient documentation

## 2011-08-10 DIAGNOSIS — N390 Urinary tract infection, site not specified: Secondary | ICD-10-CM | POA: Insufficient documentation

## 2011-08-10 DIAGNOSIS — K921 Melena: Secondary | ICD-10-CM | POA: Insufficient documentation

## 2011-08-10 DIAGNOSIS — K648 Other hemorrhoids: Secondary | ICD-10-CM | POA: Insufficient documentation

## 2011-08-10 DIAGNOSIS — R059 Cough, unspecified: Secondary | ICD-10-CM | POA: Insufficient documentation

## 2011-08-10 LAB — COMPREHENSIVE METABOLIC PANEL
ALT: 12 U/L (ref 0–35)
AST: 18 U/L (ref 0–37)
Calcium: 10.2 mg/dL (ref 8.4–10.5)
Creatinine, Ser: 1.07 mg/dL (ref 0.50–1.10)
GFR calc non Af Amer: 70 mL/min — ABNORMAL LOW (ref >90)
Sodium: 139 mEq/L (ref 135–145)
Total Protein: 7.7 g/dL (ref 6.0–8.3)

## 2011-08-10 LAB — OCCULT BLOOD, POC DEVICE: Fecal Occult Bld: NEGATIVE

## 2011-08-10 LAB — CBC
HCT: 39.7 % (ref 36.0–46.0)
MCH: 32.2 pg (ref 26.0–34.0)
MCHC: 33.8 g/dL (ref 30.0–36.0)
MCV: 95.4 fL (ref 78.0–100.0)
Platelets: 290 10*3/uL (ref 150–400)
RDW: 14.4 % (ref 11.5–15.5)
WBC: 7.4 10*3/uL (ref 4.0–10.5)

## 2011-08-10 LAB — DIFFERENTIAL
Basophils Absolute: 0 10*3/uL (ref 0.0–0.1)
Basophils Relative: 0 % (ref 0–1)
Eosinophils Absolute: 0.3 10*3/uL (ref 0.0–0.7)
Eosinophils Relative: 4 % (ref 0–5)
Monocytes Absolute: 0.4 10*3/uL (ref 0.1–1.0)

## 2011-08-10 LAB — URINE MICROSCOPIC-ADD ON

## 2011-08-10 LAB — URINALYSIS, ROUTINE W REFLEX MICROSCOPIC
Bilirubin Urine: NEGATIVE
Ketones, ur: NEGATIVE mg/dL
Protein, ur: NEGATIVE mg/dL
Urobilinogen, UA: 0.2 mg/dL (ref 0.0–1.0)

## 2011-08-10 MED ORDER — HYDROCODONE-ACETAMINOPHEN 5-325 MG PO TABS: 1.0000 | ORAL_TABLET | ORAL | Status: AC | PRN

## 2011-08-10 MED ORDER — CIPROFLOXACIN IN D5W 400 MG/200ML IV SOLN
400.0000 mg | Freq: Once | INTRAVENOUS | Status: AC
  Administered 2011-08-10: 400 mg via INTRAVENOUS
  Filled 2011-08-10: qty 200

## 2011-08-10 MED ORDER — SULFAMETHOXAZOLE-TRIMETHOPRIM 800-160 MG PO TABS: 1.0000 | ORAL_TABLET | Freq: Two times a day (BID) | ORAL | Status: AC

## 2011-08-10 MED ORDER — DIPHENHYDRAMINE HCL 50 MG/ML IJ SOLN
25.0000 mg | Freq: Once | INTRAMUSCULAR | Status: AC
  Administered 2011-08-10: 23:00:00 via INTRAVENOUS

## 2011-08-10 MED ORDER — DIPHENHYDRAMINE HCL 50 MG/ML IJ SOLN
INTRAMUSCULAR | Status: AC
  Filled 2011-08-10: qty 1

## 2011-08-10 MED ORDER — OXYCODONE-ACETAMINOPHEN 5-325 MG PO TABS
1.0000 | ORAL_TABLET | Freq: Once | ORAL | Status: AC
  Administered 2011-08-10: 1 via ORAL
  Filled 2011-08-10: qty 1

## 2011-08-10 MED ORDER — ONDANSETRON HCL 4 MG PO TABS: 4.0000 mg | ORAL_TABLET | Freq: Four times a day (QID) | ORAL | Status: AC

## 2011-08-10 NOTE — ED Provider Notes (Signed)
History     CSN: [id number]  Arrival date & time 08/10/11  1748   First MD Initiated Contact with Patient 08/10/11 2215      Chief Complaint  Patient presents with  . Flank Pain  . Rectal Bleeding    (Consider location/radiation/quality/duration/timing/severity/associated sxs/prior treatment) The history is provided by the patient.   Pt has ongoing rectal bleeding for which she has been seen in the past. She has been referred to a GI for further investigation. Pt jis currently having no bleeding. Blood is described as bright red. Pt also c/o L flank pain but denies urinary or vaginal symptoms. She is unable to give history of how long symptoms have been present.  Past Medical History  Diagnosis Date  . Seizures   . Bipolar 1 disorder   . Asthma   . Substance abuse     Past Surgical History  Procedure Date  . Cholecystectomy   . Abdominal hysterectomy   . Abdominal surgery     History reviewed. No pertinent family history.  History  Substance Use Topics  . Smoking status: Current Everyday Smoker -- 1.0 packs/day  . Smokeless tobacco: Not on file  . Alcohol Use: 1.2 oz/week    2 Cans of beer per week    OB History    Grav Para Term Preterm Abortions TAB SAB Ect Mult Living                  Review of Systems  Constitutional: Negative for fever and chills.  Respiratory: Negative for shortness of breath.   Cardiovascular: Negative for chest pain.  Gastrointestinal: Positive for anal bleeding. Negative for nausea, vomiting, abdominal pain, diarrhea and constipation.  Genitourinary: Positive for flank pain. Negative for dysuria, hematuria, vaginal bleeding, vaginal discharge, vaginal pain and pelvic pain.  Skin: Negative for pallor.  Neurological: Negative for dizziness, weakness, light-headedness and headaches.    Allergies  Metronidazole; Penicillins; Propoxyphene n-acetaminophen; and Rocephin  Home Medications   Current Outpatient Rx  Name Route Sig  Dispense Refill  . ALBUTEROL SULFATE HFA 108 (90 BASE) MCG/ACT IN AERS Inhalation Inhale 2 puffs into the lungs every 6 (six) hours as needed. Shortness of breath    . CARBAMAZEPINE 100 MG PO CHEW Oral Chew 150 mg by mouth 3 (three) times daily.      Marland Kitchen GABAPENTIN 300 MG PO CAPS Oral Take 300 mg by mouth 3 (three) times daily.      Marland Kitchen HYDROCODONE-ACETAMINOPHEN 5-325 MG PO TABS Oral Take 1-2 tablets by mouth every 6 (six) hours as needed for pain. 10 tablet 0  . HYDROCODONE-ACETAMINOPHEN 5-325 MG PO TABS Oral Take 1 tablet by mouth every 4 (four) hours as needed for pain. 10 tablet 0  . ONDANSETRON HCL 4 MG PO TABS Oral Take 1 tablet (4 mg total) by mouth every 6 (six) hours. 12 tablet 0  . SULFAMETHOXAZOLE-TRIMETHOPRIM 800-160 MG PO TABS Oral Take 1 tablet by mouth 2 (two) times daily. 28 tablet 0    BP 114/72  Pulse 98  Temp(Src) 98.3 F (36.8 C) (Oral)  Resp 20  Ht 5\' 7"  (1.702 m)  Wt 145 lb (65.772 kg)  BMI 22.71 kg/m2  SpO2 99%  Physical Exam  Nursing note and vitals reviewed. Constitutional: She is oriented to person, place, and time. She appears well-developed and well-nourished. No distress.  HENT:  Head: Normocephalic and atraumatic.  Mouth/Throat: Oropharynx is clear and moist.  Eyes: EOM are normal. Pupils are equal, round, and reactive  to light.  Neck: Normal range of motion. Neck supple.  Cardiovascular: Normal rate and regular rhythm.   Pulmonary/Chest: Effort normal and breath sounds normal. No respiratory distress. She has no wheezes. She has no rales.  Abdominal: Soft. Bowel sounds are normal. There is no tenderness. There is no rebound and no guarding.  Genitourinary: Guaiac negative stool.       Rectal exam significant for internal hemorrhoids but no bleeding.   Musculoskeletal: Normal range of motion. She exhibits no edema and no tenderness.       Mild L flank pain.   Neurological: She is alert and oriented to person, place, and time.  Skin: Skin is warm and dry.  No rash noted. No erythema.  Psychiatric: She has a normal mood and affect. Her behavior is normal.    ED Course  Procedures (including critical care time)  Labs Reviewed  URINALYSIS, ROUTINE W REFLEX MICROSCOPIC - Abnormal; Notable for the following:    APPearance HAZY (*)    Hgb urine dipstick TRACE (*)    All other components within normal limits  COMPREHENSIVE METABOLIC PANEL - Abnormal; Notable for the following:    GFR calc non Af Amer 70 (*)    GFR calc Af Amer 81 (*)    All other components within normal limits  URINE MICROSCOPIC-ADD ON - Abnormal; Notable for the following:    Squamous Epithelial / LPF MANY (*)    Bacteria, UA MANY (*)    All other components within normal limits  CBC  DIFFERENTIAL  PREGNANCY, URINE  OCCULT BLOOD, POC DEVICE  LAB REPORT - SCANNED   No results found.   1. UTI (urinary tract infection)   2. Lower GI bleed       MDM  Will treat for UTI. F/U with GI prev referred. Return for concerns        Loren Racer, MD 08/13/11 0730

## 2011-08-10 NOTE — ED Notes (Signed)
Pt presents with bilateral flank pain and rectal bleeding since 08/02/2011

## 2011-08-10 NOTE — ED Provider Notes (Signed)
History  This chart was scribed for Loren Racer, MD by Bennett Scrape. This patient was seen in room [facility] and the patient's care was started at 10:38PM.  CSN: [id number]  Arrival date & time 08/10/11  1748   First MD Initiated Contact with Patient 08/10/11 2215      Chief Complaint  Patient presents with  . Flank Pain  . Rectal Bleeding    Patient is a 31 y.o. female presenting with flank pain and hematochezia. The history is provided by the patient. No language interpreter was used.  Flank Pain This is a new problem. The current episode started yesterday. The problem occurs constantly. The problem has been gradually worsening. Pertinent negatives include no chest pain, no abdominal pain, no headaches and no shortness of breath. The symptoms are aggravated by nothing. The symptoms are relieved by nothing.  Rectal Bleeding  The current episode started more than 1 week ago. The onset was gradual. The problem occurs continuously. The problem has been unchanged. Associated symptoms include a fever, nausea and coughing. Pertinent negatives include no abdominal pain, no diarrhea, no vomiting, no hematuria, no vaginal discharge, no chest pain, no headaches and no rash.    [name] is a 31 y.o. female who presents to the Emergency Department complaining of 24 hours of gradual onset, gradually worsening, constant left flank pain described as a cramping feeling with associated dysuria and nasuea. Pt also c/o 9 days of sudden onset, non-changing, intermittent rectal bleeding described as bright red blood and pain and cough productive of phlegm and intermittent fever. Fever was unmeasured at home. Temperature was measured at 98.3 in the ED. Pt was seen here 9 days ago for similar symptoms and stated that the rectal bleeding began after a fall down the steps. Pt denies that the cause of the rectal bleeding is hemorrhoids. Pt reports taking hydrocodone with mild improvement in pain. She  denies diarrhea, congestion and vaginal discharge as associated symptoms. Pt denies any modifying factors. She has a h/o seizures, bipolar disorder, asthma and substance abuse. She is a current everyday smoker and alcohol user.  Past Medical History  Diagnosis Date  . Seizures   . Bipolar 1 disorder   . Asthma   . Substance abuse     Past Surgical History  Procedure Date  . Cholecystectomy   . Abdominal hysterectomy   . Abdominal surgery     History reviewed. No pertinent family history.  History  Substance Use Topics  . Smoking status: Current Everyday Smoker -- 1.0 packs/day  . Smokeless tobacco: Not on file  . Alcohol Use: 1.2 oz/week    2 Cans of beer per week     Review of Systems  Constitutional: Positive for fever. Negative for chills.  HENT: Negative for congestion, sore throat, rhinorrhea and neck pain.   Eyes: Negative for pain.  Respiratory: Positive for cough. Negative for shortness of breath.   Cardiovascular: Negative for chest pain.  Gastrointestinal: Positive for nausea and hematochezia. Negative for vomiting, abdominal pain and diarrhea.  Genitourinary: Positive for dysuria and flank pain. Negative for hematuria and vaginal discharge.  Musculoskeletal: Negative for back pain.  Skin: Negative for rash.  Neurological: Negative for numbness and headaches.    Allergies  Metronidazole; Penicillins; Propoxyphene n-acetaminophen; and Rocephin  Home Medications   Current Outpatient Rx  Name Route Sig Dispense Refill  . ALBUTEROL SULFATE HFA 108 (90 BASE) MCG/ACT IN AERS Inhalation Inhale 2 puffs into the lungs every 6 (six) hours  as needed. Shortness of breath    . CARBAMAZEPINE 100 MG PO CHEW Oral Chew 150 mg by mouth 3 (three) times daily.      Marland Kitchen GABAPENTIN 300 MG PO CAPS Oral Take 300 mg by mouth 3 (three) times daily.      Marland Kitchen HYDROCODONE-ACETAMINOPHEN 5-325 MG PO TABS Oral Take 1-2 tablets by mouth every 6 (six) hours as needed for pain. 10 tablet 0  .  HYDROCODONE-ACETAMINOPHEN 5-325 MG PO TABS Oral Take 1 tablet by mouth every 4 (four) hours as needed for pain. 10 tablet 0  . ONDANSETRON HCL 4 MG PO TABS Oral Take 1 tablet (4 mg total) by mouth every 6 (six) hours. 12 tablet 0  . SULFAMETHOXAZOLE-TRIMETHOPRIM 800-160 MG PO TABS Oral Take 1 tablet by mouth 2 (two) times daily. 28 tablet 0    Triage Vitals: BP 114/72  Pulse 98  Temp(Src) 98.3 F (36.8 C) (Oral)  Resp 20  Ht 5\' 7"  (1.702 m)  Wt 145 lb (65.772 kg)  BMI 22.71 kg/m2  SpO2 99%  Physical Exam  Nursing note and vitals reviewed. Constitutional: She is oriented to person, place, and time. She appears well-developed and well-nourished.  HENT:  Head: Normocephalic and atraumatic.  Mouth/Throat: Oropharynx is clear and moist.  Eyes: EOM are normal. Pupils are equal, round, and reactive to light.  Neck: Normal range of motion. Neck supple. No tracheal deviation present.  Cardiovascular: Normal rate, regular rhythm and normal heart sounds.  Exam reveals no gallop and no friction rub.   No murmur heard. Pulmonary/Chest: Effort normal and breath sounds normal. No stridor. No respiratory distress. She has no wheezes. She has no rales. She exhibits no tenderness.  Abdominal: Soft. Bowel sounds are normal. She exhibits no distension. There is no tenderness. There is no rebound and no guarding.  Genitourinary: Guaiac negative stool.       Internal hemorrhoids, no active bleeding  Musculoskeletal: Normal range of motion. She exhibits tenderness (Mild left flank tenderness). She exhibits no edema.  Neurological: She is alert and oriented to person, place, and time. No cranial nerve deficit.  Skin: Skin is warm and dry. No rash noted.  Psychiatric: She has a normal mood and affect. Her behavior is normal.    ED Course  Procedures (including critical care time)  DIAGNOSTIC STUDIES: Oxygen Saturation is 99% on room air room air, normal by my interpretation.    COORDINATION OF  CARE: 10:41PM-Counseled pt on smoking cessation. 10:43PM-Discussed treatment plan with pt and pt agreed to plan. 11:15PM-Pt had an itching response to ciprofloxacin. Will switch pt to benadryl.    Labs Reviewed  URINALYSIS, ROUTINE W REFLEX MICROSCOPIC - Abnormal; Notable for the following:    APPearance HAZY (*)    Hgb urine dipstick TRACE (*)    All other components within normal limits  COMPREHENSIVE METABOLIC PANEL - Abnormal; Notable for the following:    GFR calc non Af Amer 70 (*)    GFR calc Af Amer 81 (*)    All other components within normal limits  URINE MICROSCOPIC-ADD ON - Abnormal; Notable for the following:    Squamous Epithelial / LPF MANY (*)    Bacteria, UA MANY (*)    All other components within normal limits  CBC  DIFFERENTIAL  PREGNANCY, URINE  OCCULT BLOOD, POC DEVICE   No results found.   1. UTI (urinary tract infection)   2. Lower GI bleed       MDM  I personally performed the services described in this documentation, which was scribed in my presence. The recorded information has been reviewed and considered.   Loren Racer, MD 08/10/11 [phone]

## 2011-08-16 ENCOUNTER — Other Ambulatory Visit: Payer: Self-pay

## 2011-08-16 ENCOUNTER — Emergency Department (HOSPITAL_COMMUNITY): Payer: Self-pay

## 2011-08-16 ENCOUNTER — Encounter (HOSPITAL_COMMUNITY): Payer: Self-pay

## 2011-08-16 ENCOUNTER — Emergency Department (HOSPITAL_COMMUNITY)
Admission: EM | Admit: 2011-08-16 | Discharge: 2011-08-16 | Disposition: A | Discharge status: Home / Self Care | Payer: Self-pay | Attending: Emergency Medicine | Admitting: Emergency Medicine

## 2011-08-16 DIAGNOSIS — Z9079 Acquired absence of other genital organ(s): Secondary | ICD-10-CM | POA: Insufficient documentation

## 2011-08-16 DIAGNOSIS — F17200 Nicotine dependence, unspecified, uncomplicated: Secondary | ICD-10-CM | POA: Insufficient documentation

## 2011-08-16 DIAGNOSIS — J45909 Unspecified asthma, uncomplicated: Secondary | ICD-10-CM | POA: Insufficient documentation

## 2011-08-16 DIAGNOSIS — R5381 Other malaise: Secondary | ICD-10-CM | POA: Insufficient documentation

## 2011-08-16 DIAGNOSIS — Z9889 Other specified postprocedural states: Secondary | ICD-10-CM | POA: Insufficient documentation

## 2011-08-16 DIAGNOSIS — R209 Unspecified disturbances of skin sensation: Secondary | ICD-10-CM | POA: Insufficient documentation

## 2011-08-16 DIAGNOSIS — R109 Unspecified abdominal pain: Secondary | ICD-10-CM | POA: Insufficient documentation

## 2011-08-16 DIAGNOSIS — R197 Diarrhea, unspecified: Secondary | ICD-10-CM | POA: Insufficient documentation

## 2011-08-16 DIAGNOSIS — F319 Bipolar disorder, unspecified: Secondary | ICD-10-CM | POA: Insufficient documentation

## 2011-08-16 DIAGNOSIS — R569 Unspecified convulsions: Secondary | ICD-10-CM | POA: Insufficient documentation

## 2011-08-16 DIAGNOSIS — R079 Chest pain, unspecified: Secondary | ICD-10-CM | POA: Insufficient documentation

## 2011-08-16 LAB — URINE MICROSCOPIC-ADD ON

## 2011-08-16 LAB — URINALYSIS, ROUTINE W REFLEX MICROSCOPIC
Bilirubin Urine: NEGATIVE
Glucose, UA: NEGATIVE mg/dL
Ketones, ur: NEGATIVE mg/dL
Leukocytes, UA: NEGATIVE
Nitrite: NEGATIVE
Protein, ur: NEGATIVE mg/dL
pH: 5.5 (ref 5.0–8.0)

## 2011-08-16 MED ORDER — OXYCODONE-ACETAMINOPHEN 5-325 MG PO TABS
1.0000 | ORAL_TABLET | Freq: Once | ORAL | Status: AC
  Administered 2011-08-16: 1 via ORAL
  Filled 2011-08-16: qty 1

## 2011-08-16 MED ORDER — LORAZEPAM 1 MG PO TABS
1.0000 mg | ORAL_TABLET | Freq: Once | ORAL | Status: AC
  Administered 2011-08-16: 1 mg via ORAL
  Filled 2011-08-16: qty 1

## 2011-08-16 NOTE — Discharge Instructions (Signed)
Your caregiver has diagnosed you as having chest pain that is not specific for one problem, but does not require admission.  Chest pain comes from many different causes.  SEEK IMMEDIATE MEDICAL ATTENTION IF: You have severe chest pain, especially if the pain is crushing or pressure-like and spreads to the arms, back, neck, or jaw, or if you have sweating, nausea (feeling sick to your stomach), or shortness of breath. THIS IS AN EMERGENCY. Don't wait to see if the pain will go away. Get medical help at once. Call 911 or 0 (operator). DO NOT drive yourself to the hospital.  Your chest pain gets worse and does not go away with rest.  You have an attack of chest pain lasting longer than usual, despite rest and treatment with the medications your caregiver has prescribed.  You wake from sleep with chest pain or shortness of breath.  You feel dizzy or faint.  You have chest pain not typical of your usual pain for which you originally saw your caregiver.  Abdominal (belly) pain can be caused by many things. any cases can be observed and treated at home after initial evaluation in the emergency department. Even though you are being discharged home, abdominal pain can be unpredictable. Therefore, you need a repeated exam if your pain does not resolve, returns, or worsens. Most patients with abdominal pain don't have to be admitted to the hospital or have surgery, but serious problems like appendicitis and gallbladder attacks can start out as nonspecific pain. Many abdominal conditions cannot be diagnosed in one visit, so follow-up evaluations are very important. SEEK IMMEDIATE MEDICAL ATTENTION IF: The pain does not go away or becomes severe, particularly over the next 8-12 hours.  A temperature above 100.4F develops.  Repeated vomiting occurs (multiple episodes).  The pain becomes localized to portions of the abdomen. The right side could possibly be appendicitis. In an adult, the left lower portion of the  abdomen could be colitis or diverticulitis.  Blood is being passed in stools or vomit (bright red or black tarry stools).  Return also if you develop chest pain, difficulty breathing, dizziness or fainting, or become confused, poorly responsive.   

## 2011-08-16 NOTE — ED Provider Notes (Signed)
History     CSN: [id number]  Arrival date & time 08/16/11  7846   First MD Initiated Contact with Patient 08/16/11 0440      Chief Complaint  Patient presents with  . Chest Pain     Patient is a 31 y.o. female presenting with chest pain. The history is provided by the patient and the EMS personnel.  Chest Pain Episode onset: tonight. Chest pain occurs constantly. The chest pain is unchanged. Associated with: movement. The severity of the pain is moderate. The quality of the pain is described as aching. The pain does not radiate. Primary symptoms include fatigue. Pertinent negatives for primary symptoms include no shortness of breath, no vomiting and no altered mental status.   Pt presents via EMS She was found on floor of restroom by a bystander.  When she woke up she reported abdominal pain and chest pain.   She also has h/o seizure disorder but she is not sure if she had a seizure.   She reports she is unable to afford meds for her seizures No vomiting reported, but does report abd pain with diarrhea No dysuria is reported  Past Medical History  Diagnosis Date  . Seizures   . Bipolar 1 disorder   . Asthma   . Substance abuse     Past Surgical History  Procedure Date  . Cholecystectomy   . Abdominal hysterectomy   . Abdominal surgery     No family history on file.  History  Substance Use Topics  . Smoking status: Current Everyday Smoker -- 1.0 packs/day  . Smokeless tobacco: Not on file  . Alcohol Use: 1.2 oz/week    2 Cans of beer per week    OB History    Grav Para Term Preterm Abortions TAB SAB Ect Mult Living                  Review of Systems  Constitutional: Positive for fatigue.  Respiratory: Negative for shortness of breath.   Cardiovascular: Positive for chest pain.  Gastrointestinal: Negative for vomiting.  Psychiatric/Behavioral: Negative for altered mental status.  All other systems reviewed and are negative.    Allergies  Metronidazole;  Penicillins; Propoxyphene n-acetaminophen; and Rocephin  Home Medications   Current Outpatient Rx  Name Route Sig Dispense Refill  . ALBUTEROL SULFATE HFA 108 (90 BASE) MCG/ACT IN AERS Inhalation Inhale 2 puffs into the lungs every 6 (six) hours as needed. Shortness of breath    . CARBAMAZEPINE 100 MG PO CHEW Oral Chew 150 mg by mouth 3 (three) times daily.      Marland Kitchen GABAPENTIN 300 MG PO CAPS Oral Take 300 mg by mouth 3 (three) times daily.      Marland Kitchen HYDROCODONE-ACETAMINOPHEN 5-325 MG PO TABS Oral Take 1 tablet by mouth every 4 (four) hours as needed for pain. 10 tablet 0  . ONDANSETRON HCL 4 MG PO TABS Oral Take 1 tablet (4 mg total) by mouth every 6 (six) hours. 12 tablet 0  . SULFAMETHOXAZOLE-TRIMETHOPRIM 800-160 MG PO TABS Oral Take 1 tablet by mouth 2 (two) times daily. 28 tablet 0    BP 127/76  Pulse 102  Temp(Src) 98.2 F (36.8 C) (Oral)  Resp 18  Wt 145 lb (65.772 kg)  SpO2 99%  Physical Exam CONSTITUTIONAL: Well developed/well nourished HEAD AND FACE: Normocephalic/atraumatic EYES: EOMI/PERRL ENMT: Mucous membranes moist, poor dentition, no evidence of acute trauma NECK: supple no meningeal signs SPINE:entire spine nontender, No bruising/crepitance/stepoffs noted to spine CV:  S1/S2 noted, no murmurs/rubs/gallops noted LUNGS: Lungs are clear to auscultation bilaterally, no apparent distress ABDOMEN: soft, nontender, no rebound or guarding GU:no cva tenderness NEURO: Pt is awake/alert, moves all extremitiesx4 GCS 15 Gait normal unassisted EXTREMITIES: pulses normal, full ROM, no edema, no tenderness to palpation of her extremities SKIN: warm, color normal PSYCH: no abnormalities of mood noted  ED Course  Procedures   Labs Reviewed  URINALYSIS, ROUTINE W REFLEX MICROSCOPIC - Abnormal; Notable for the following:    Hgb urine dipstick TRACE (*)    All other components within normal limits  URINE MICROSCOPIC-ADD ON   Pt seen on arrival.  I discussed with EMS.  Pt  apparently found on floor of bathroom presumed seizure prior to fall as she has known h/o seizure and is not on treatment.  Pt is unsure if she had seizure.  She reports recent anxiety and this seems to make her CP worse.  She is unsure if she had significant CP prior to being found on floor   5:37 AM Pt reports she just got off phone with boyfriend and she is now upset, and now have chest pain and numbness in her extremities.  There seems to be an anxiety component to this pain.  At this time, suspicion for ACS/PE is low I feel she is safe for d/c home.  EKG shows no signs of arrythmia.  Advised f/u as outpatient  The patient appears reasonably screened and/or stabilized for discharge and I doubt any other medical condition or other [facility] requiring further screening, evaluation, or treatment in the ED at this time prior to discharge.     MDM  Nursing notes reviewed and considered in documentation All labs/vitals reviewed and considered Previous records reviewed and considered xrays reviewed and considered EMS EKG reviewed - artifact noted, but no acute ST changes noted     Date: 08/16/2011  Rate: 91  Rhythm: normal sinus rhythm  QRS Axis: normal  Intervals: normal  ST/T Wave abnormalities: normal  Conduction Disutrbances:none      Joya Gaskins, MD 08/16/11 [phone]

## 2011-08-16 NOTE — ED Notes (Signed)
Called to patient's room.  Patient crying; states her boyfriend just called and got her upset.  She has requested that if he calls back or comes to visit, she doesn't want to talk to or see him.

## 2011-08-16 NOTE — ED Notes (Signed)
Pt found on floor of restroom tonight at [facility].  Pt has been c/o chest pain and abd pain onset tonight also.

## 2011-08-16 NOTE — ED Notes (Signed)
Patient ambulatory to bathroom to collect urine for lab.  Patient with steady gait.

## 2011-11-25 ENCOUNTER — Emergency Department (HOSPITAL_COMMUNITY)
Admission: EM | Admit: 2011-11-25 | Discharge: 2011-11-25 | Disposition: A | Discharge status: Home / Self Care | Payer: Self-pay | Attending: Emergency Medicine | Admitting: Emergency Medicine

## 2011-11-25 ENCOUNTER — Encounter (HOSPITAL_COMMUNITY): Payer: Self-pay

## 2011-11-25 DIAGNOSIS — K089 Disorder of teeth and supporting structures, unspecified: Secondary | ICD-10-CM | POA: Insufficient documentation

## 2011-11-25 DIAGNOSIS — R220 Localized swelling, mass and lump, head: Secondary | ICD-10-CM | POA: Insufficient documentation

## 2011-11-25 DIAGNOSIS — K0889 Other specified disorders of teeth and supporting structures: Secondary | ICD-10-CM

## 2011-11-25 DIAGNOSIS — K029 Dental caries, unspecified: Secondary | ICD-10-CM | POA: Insufficient documentation

## 2011-11-25 DIAGNOSIS — F17200 Nicotine dependence, unspecified, uncomplicated: Secondary | ICD-10-CM | POA: Insufficient documentation

## 2011-11-25 MED ORDER — CLINDAMYCIN HCL 150 MG PO CAPS: 150.0000 mg | ORAL_CAPSULE | Freq: Four times a day (QID) | ORAL | Status: AC

## 2011-11-25 MED ORDER — IBUPROFEN 800 MG PO TABS
800.0000 mg | ORAL_TABLET | Freq: Once | ORAL | Status: AC
  Administered 2011-11-25: 800 mg via ORAL
  Filled 2011-11-25: qty 1

## 2011-11-25 MED ORDER — CLINDAMYCIN HCL 150 MG PO CAPS
300.0000 mg | ORAL_CAPSULE | Freq: Once | ORAL | Status: AC
  Administered 2011-11-25: 300 mg via ORAL
  Filled 2011-11-25: qty 2

## 2011-11-25 MED ORDER — HYDROCODONE-ACETAMINOPHEN 5-325 MG PO TABS: 1.0000 | ORAL_TABLET | ORAL | Status: AC | PRN

## 2011-11-25 MED ORDER — HYDROCODONE-ACETAMINOPHEN 5-325 MG PO TABS
2.0000 | ORAL_TABLET | Freq: Once | ORAL | Status: AC
  Administered 2011-11-25: 2 via ORAL
  Filled 2011-11-25: qty 2

## 2011-11-25 MED ORDER — IBUPROFEN 800 MG PO TABS: 800.0000 mg | ORAL_TABLET | Freq: Three times a day (TID) | ORAL | Status: AC

## 2011-11-25 NOTE — ED Provider Notes (Signed)
History     CSN: [id number]  Arrival date & time 11/25/11  9562   First MD Initiated Contact with Patient 11/25/11 0220      Chief Complaint  Patient presents with  . Dental Pain    (Consider location/radiation/quality/duration/timing/severity/associated sxs/prior treatment) HPI  [name] is a 31 y.o. female who presents to the Emergency Department complaining of right sided jaw pain, facial swelling, dental pain x 2 days. Patient states tooth on right lower jaw has had swelling to the gumline and developed facial swelling overnight. Has taken ibuprofen without relief. Unable to see a dentist due to financial constraints.  No PCP   Past Medical History  Diagnosis Date  . Seizures   . Bipolar 1 disorder   . Asthma   . Substance abuse     Past Surgical History  Procedure Date  . Cholecystectomy   . Abdominal hysterectomy   . Abdominal surgery     No family history on file.  History  Substance Use Topics  . Smoking status: Current Everyday Smoker -- 1.0 packs/day  . Smokeless tobacco: Not on file  . Alcohol Use: 1.2 oz/week    2 Cans of beer per week    OB History    Grav Para Term Preterm Abortions TAB SAB Ect Mult Living                  Review of Systems  Constitutional: Negative for fever.       10 Systems reviewed and are negative for acute change except as noted in the HPI.  HENT: Positive for facial swelling. Negative for congestion.   Eyes: Negative for discharge and redness.  Respiratory: Negative for cough and shortness of breath.   Cardiovascular: Negative for chest pain.  Gastrointestinal: Negative for vomiting and abdominal pain.  Musculoskeletal: Negative for back pain.  Skin: Negative for rash.  Neurological: Negative for syncope, numbness and headaches.  Psychiatric/Behavioral:       No behavior change.    Allergies  Metronidazole; Penicillins; Propoxyphene-acetaminophen; and Rocephin  Home Medications   Current Outpatient  Rx  Name Route Sig Dispense Refill  . ALBUTEROL SULFATE HFA 108 (90 BASE) MCG/ACT IN AERS Inhalation Inhale 2 puffs into the lungs every 6 (six) hours as needed. Shortness of breath    . CARBAMAZEPINE 100 MG PO CHEW Oral Chew 150 mg by mouth 3 (three) times daily.      Marland Kitchen GABAPENTIN 300 MG PO CAPS Oral Take 300 mg by mouth 3 (three) times daily.      Marland Kitchen HYDROCODONE-ACETAMINOPHEN 10-325 MG PO TABS Oral Take 1 tablet by mouth every 6 (six) hours as needed.    Marland Kitchen CLINDAMYCIN HCL 150 MG PO CAPS Oral Take 1 capsule (150 mg total) by mouth every 6 (six) hours. 28 capsule 0  . HYDROCODONE-ACETAMINOPHEN 5-325 MG PO TABS Oral Take 1 tablet by mouth every 4 (four) hours as needed for pain. 15 tablet 0  . IBUPROFEN 800 MG PO TABS Oral Take 1 tablet (800 mg total) by mouth 3 (three) times daily. 21 tablet 0    BP 127/74  Pulse 120  Temp(Src) 98.9 F (37.2 C) (Oral)  Resp 18  Ht 5\' 8"  (1.727 m)  Wt 145 lb (65.772 kg)  BMI 22.05 kg/m2  SpO2 100%  Physical Exam  Nursing note and vitals reviewed. Constitutional:       Awake, alert, nontoxic appearance.  HENT:  Head: Atraumatic.  Right Ear: External ear normal.  Left  Ear: External ear normal.       Very poor dentition with multiple blackened teeth, broken teeth, multiple caries in teeth still present. Right lower first molar partially broken, with gums swollen, no pointed abscess noted. Right facial swelling.  Eyes: Right eye exhibits no discharge. Left eye exhibits no discharge.  Neck: Neck supple.  Cardiovascular: Normal rate, regular rhythm and normal heart sounds.   Pulmonary/Chest: Effort normal. She exhibits no tenderness.  Abdominal: Soft. There is no tenderness. There is no rebound.  Musculoskeletal: She exhibits no tenderness.       Baseline ROM, no obvious new focal weakness.  Neurological:       Mental status and motor strength appears baseline for patient and situation.  Skin: No rash noted.  Psychiatric: She has a normal mood and  affect.    ED Course  Procedures (including critical care time)    1. Pain, dental       MDM  Patient with very poor dentition who has swelling to the right side of her face and multiple teeth in the right lower jaw and upper jaw that are decayed and disease. No obvious abscess. Initiated antibiotic therapy. Patient was encouraged to try and see a dentist.Pt stable in ED with no significant deterioration in condition.The patient appears reasonably screened and/or stabilized for discharge and I doubt any other medical condition or other [facility] requiring further screening, evaluation, or treatment in the ED at this time prior to discharge.  MDM Reviewed: nursing note and vitals           Nicoletta Dress. Colon Branch, MD 11/26/11 [phone]

## 2011-11-25 NOTE — Discharge Instructions (Signed)
Dental Pain Toothache is pain in or around a tooth. It may get worse with chewing or with cold or heat.  HOME CARE  Your dentist may use a numbing medicine during treatment. If so, you may need to avoid eating until the medicine wears off. Ask your dentist about this.   Only take medicine as told by your dentist or doctor.   Avoid chewing food near the painful tooth until after all treatment is done. Ask your dentist about this.  GET HELP RIGHT AWAY IF:   The problem gets worse or new problems appear.   You have a fever.   There is redness and puffiness (swelling) of the face, jaw, or neck.   You cannot open your mouth.   There is pain in the jaw.   There is very bad pain that is not helped by medicine.  MAKE SURE YOU:   Understand these instructions.   Will watch your condition.   Will get help right away if you are not doing well or get worse.  Document Released: 12/03/2007 Document Revised: 06/05/2011 Document Reviewed: 12/03/2007 [employer] Patient Information 2012 [employer]. 

## 2011-11-25 NOTE — ED Notes (Signed)
Pain and swelling  To right upper jaw, dental pain

## 2012-01-31 ENCOUNTER — Emergency Department (HOSPITAL_COMMUNITY)
Admission: EM | Admit: 2012-01-31 | Discharge: 2012-01-31 | Disposition: A | Discharge status: Home / Self Care | Payer: Medicaid Other | Attending: Emergency Medicine | Admitting: Emergency Medicine

## 2012-01-31 ENCOUNTER — Emergency Department (HOSPITAL_COMMUNITY): Payer: Medicaid Other

## 2012-01-31 ENCOUNTER — Encounter (HOSPITAL_COMMUNITY): Payer: Self-pay

## 2012-01-31 DIAGNOSIS — F319 Bipolar disorder, unspecified: Secondary | ICD-10-CM | POA: Insufficient documentation

## 2012-01-31 DIAGNOSIS — F17200 Nicotine dependence, unspecified, uncomplicated: Secondary | ICD-10-CM | POA: Insufficient documentation

## 2012-01-31 DIAGNOSIS — J45909 Unspecified asthma, uncomplicated: Secondary | ICD-10-CM | POA: Insufficient documentation

## 2012-01-31 DIAGNOSIS — R1013 Epigastric pain: Secondary | ICD-10-CM | POA: Insufficient documentation

## 2012-01-31 DIAGNOSIS — R109 Unspecified abdominal pain: Secondary | ICD-10-CM

## 2012-01-31 LAB — COMPREHENSIVE METABOLIC PANEL
ALT: 22 U/L (ref 0–35)
CO2: 21 mEq/L (ref 19–32)
Calcium: 10.3 mg/dL (ref 8.4–10.5)
Creatinine, Ser: 0.71 mg/dL (ref 0.50–1.10)
GFR calc Af Amer: >90 mL/min (ref >90)
GFR calc non Af Amer: >90 mL/min (ref >90)
Glucose, Bld: 103 mg/dL — ABNORMAL HIGH (ref 70–99)
Sodium: 136 mEq/L (ref 135–145)

## 2012-01-31 LAB — CBC WITH DIFFERENTIAL/PLATELET
Eosinophils Relative: 1 % (ref 0–5)
HCT: 40.7 % (ref 36.0–46.0)
Lymphocytes Relative: 33 % (ref 12–46)
Lymphs Abs: 2.1 10*3/uL (ref 0.7–4.0)
MCV: 91.5 fL (ref 78.0–100.0)
Monocytes Absolute: 0.3 10*3/uL (ref 0.1–1.0)
RBC: 4.45 MIL/uL (ref 3.87–5.11)
WBC: 6.3 10*3/uL (ref 4.0–10.5)

## 2012-01-31 MED ORDER — HYDROMORPHONE HCL PF 1 MG/ML IJ SOLN
1.0000 mg | Freq: Once | INTRAMUSCULAR | Status: AC
  Administered 2012-01-31: 1 mg via INTRAVENOUS
  Filled 2012-01-31: qty 1

## 2012-01-31 MED ORDER — GI COCKTAIL ~~LOC~~
30.0000 mL | Freq: Once | ORAL | Status: AC
  Administered 2012-01-31: 30 mL via ORAL
  Filled 2012-01-31: qty 30

## 2012-01-31 MED ORDER — OMEPRAZOLE 20 MG PO CPDR: 20.0000 mg | DELAYED_RELEASE_CAPSULE | Freq: Every day | ORAL | Status: DC

## 2012-01-31 MED ORDER — ONDANSETRON HCL 4 MG/2ML IJ SOLN
4.0000 mg | Freq: Once | INTRAMUSCULAR | Status: AC
  Administered 2012-01-31: 4 mg via INTRAVENOUS
  Filled 2012-01-31: qty 2

## 2012-01-31 MED ORDER — SODIUM CHLORIDE 0.9 % IV BOLUS (SEPSIS)
1000.0000 mL | Freq: Once | INTRAVENOUS | Status: AC
  Administered 2012-01-31: 1000 mL via INTRAVENOUS

## 2012-01-31 MED ORDER — HYDROCODONE-ACETAMINOPHEN 10-325 MG PO TABS: 1.0000 | ORAL_TABLET | Freq: Four times a day (QID) | ORAL | Status: DC | PRN

## 2012-01-31 MED ORDER — HYDROCODONE-ACETAMINOPHEN 5-325 MG PO TABS
1.0000 | ORAL_TABLET | Freq: Once | ORAL | Status: AC
  Administered 2012-01-31: 1 via ORAL
  Filled 2012-01-31: qty 1

## 2012-01-31 NOTE — ED Notes (Signed)
Patient transported to X-ray 

## 2012-01-31 NOTE — ED Notes (Signed)
Pt reports upper abd pain that radiates to her back x2 days w/ associating n/v/d - denies known fever - pt w/ hx of cholecystectomy and partial hysterectomy. Pt also states she has seen PCP for "a stomach problem" however can not recall what exactly. Pt also states she noted a mixture of BRB and dark red blood in her emesis and stool. Pt w/ grimacing and guarding on assessment, in no acute distress A&Ox4.

## 2012-01-31 NOTE — ED Notes (Signed)
Pt c/o stabbing pain from lower abd around to right side and into back.

## 2012-01-31 NOTE — ED Provider Notes (Signed)
History     CSN: [id number]  Arrival date & time 01/31/12  0131   First MD Initiated Contact with Patient 01/31/12 0134      Chief Complaint  Patient presents with  . Abdominal Pain    HPI The patient presents with concerns of abdominal pain.  She notes that over the past days she gradually developed pain focally about her epigastrium and superior umbilical area.  The pain is sharp, stabbing, with radiation around the right to the back.  She notes concurrent nausea, vomiting, by mouth intolerance.  Just complains of diarrhea. She denies fevers, chills, confusion, disorientation, chest pain, dyspnea. She had some relief with Vicodin.  Symptoms are worse with by mouth intake. Past Medical History  Diagnosis Date  . Seizures   . Bipolar 1 disorder   . Asthma   . Substance abuse     Past Surgical History  Procedure Date  . Cholecystectomy   . Abdominal hysterectomy   . Abdominal surgery     No family history on file.  History  Substance Use Topics  . Smoking status: Current Everyday Smoker -- 1.0 packs/day  . Smokeless tobacco: Not on file  . Alcohol Use: 1.2 oz/week    2 Cans of beer per week    OB History    Grav Para Term Preterm Abortions TAB SAB Ect Mult Living                  Review of Systems  Constitutional:       HPI  HENT:       HPI otherwise negative  Eyes: Negative.   Respiratory:       HPI, otherwise negative  Cardiovascular:       HPI, otherwise nmegative  Gastrointestinal: Positive for vomiting and diarrhea.  Genitourinary:       HPI, otherwise negative  Musculoskeletal:       HPI, otherwise negative  Skin: Negative.   Neurological: Negative for syncope.    Allergies  Metronidazole; Penicillins; Propoxyphene-acetaminophen; and Rocephin  Home Medications   Current Outpatient Rx  Name Route Sig Dispense Refill  . ALBUTEROL SULFATE HFA 108 (90 BASE) MCG/ACT IN AERS Inhalation Inhale 2 puffs into the lungs every 6 (six) hours as needed.  Shortness of breath    . CARBAMAZEPINE 100 MG PO CHEW Oral Chew 150 mg by mouth 3 (three) times daily.      Marland Kitchen GABAPENTIN 300 MG PO CAPS Oral Take 300 mg by mouth 3 (three) times daily.      Marland Kitchen HYDROCODONE-ACETAMINOPHEN 10-325 MG PO TABS Oral Take 1 tablet by mouth every 6 (six) hours as needed.      BP 127/79  Pulse 97  Temp 98.5 F (36.9 C) (Oral)  Resp 20  Ht 5\' 8"  (1.727 m)  Wt 149 lb (67.586 kg)  BMI 22.66 kg/m2  SpO2 98%  Physical Exam  Nursing note and vitals reviewed. Constitutional: She is oriented to person, place, and time. She appears well-developed and well-nourished. No distress.  HENT:  Head: Normocephalic and atraumatic.  Mouth/Throat: Oropharynx is clear and moist and mucous membranes are normal. Abnormal dentition. Dental caries present. No dental abscesses or uvula swelling.  Eyes: Conjunctivae and EOM are normal.  Cardiovascular: Normal rate and regular rhythm.   Pulmonary/Chest: Effort normal. No stridor. No respiratory distress.  Abdominal: Soft. There is tenderness in the epigastric area and periumbilical area. There is no rigidity, no rebound, no guarding, no CVA tenderness, no tenderness at McBurney's  point and negative [name]'s sign.  Musculoskeletal: She exhibits no edema.  Neurological: She is alert and oriented to person, place, and time. No cranial nerve deficit. Coordination normal.  Skin: Skin is warm. She is not diaphoretic.       Multiple tattoos and piercings  Psychiatric: She has a normal mood and affect.    ED Course  Procedures (including critical care time)   Labs Reviewed  CBC WITH DIFFERENTIAL  COMPREHENSIVE METABOLIC PANEL  LIPASE, BLOOD   No results found.   No diagnosis found.   3:19 AM Patient sitting, seemingly comfortably, in nad She was informed of results.  [place] drug database checked.  Last narcotic was in May MDM  This young female presents with abdominal pain.  Notably, the pain is superior abdominal, and she is  tenderness to palpation about the epigastrium, consistent with an initial suspicion of gastric irritation.  Following provision of medications, IV fluids, the patient was more comfortable.  The patient's labs, x-ray were unremarkable.  Given her history of similar episodes of pain, shoulder treated for reflux.  She has a GI doctor to followup with.  She requested medication, was provided a short course of narcotics.     Gerhard Munch, MD 01/31/12 [phone]

## 2012-02-21 ENCOUNTER — Emergency Department (HOSPITAL_COMMUNITY)
Admission: EM | Admit: 2012-02-21 | Discharge: 2012-02-21 | Disposition: A | Discharge status: Home / Self Care | Payer: Medicaid Other | Attending: Emergency Medicine | Admitting: Emergency Medicine

## 2012-02-21 ENCOUNTER — Emergency Department (HOSPITAL_COMMUNITY): Payer: Medicaid Other

## 2012-02-21 ENCOUNTER — Encounter (HOSPITAL_COMMUNITY): Payer: Self-pay | Admitting: Emergency Medicine

## 2012-02-21 DIAGNOSIS — W19XXXA Unspecified fall, initial encounter: Secondary | ICD-10-CM | POA: Insufficient documentation

## 2012-02-21 DIAGNOSIS — J209 Acute bronchitis, unspecified: Secondary | ICD-10-CM | POA: Insufficient documentation

## 2012-02-21 DIAGNOSIS — S139XXA Sprain of joints and ligaments of unspecified parts of neck, initial encounter: Secondary | ICD-10-CM | POA: Insufficient documentation

## 2012-02-21 DIAGNOSIS — S0990XA Unspecified injury of head, initial encounter: Secondary | ICD-10-CM

## 2012-02-21 DIAGNOSIS — Z9089 Acquired absence of other organs: Secondary | ICD-10-CM | POA: Insufficient documentation

## 2012-02-21 DIAGNOSIS — S335XXA Sprain of ligaments of lumbar spine, initial encounter: Secondary | ICD-10-CM | POA: Insufficient documentation

## 2012-02-21 DIAGNOSIS — R109 Unspecified abdominal pain: Secondary | ICD-10-CM

## 2012-02-21 DIAGNOSIS — R079 Chest pain, unspecified: Secondary | ICD-10-CM | POA: Insufficient documentation

## 2012-02-21 DIAGNOSIS — M5430 Sciatica, unspecified side: Secondary | ICD-10-CM | POA: Insufficient documentation

## 2012-02-21 DIAGNOSIS — F17200 Nicotine dependence, unspecified, uncomplicated: Secondary | ICD-10-CM | POA: Insufficient documentation

## 2012-02-21 DIAGNOSIS — S161XXA Strain of muscle, fascia and tendon at neck level, initial encounter: Secondary | ICD-10-CM

## 2012-02-21 DIAGNOSIS — M5432 Sciatica, left side: Secondary | ICD-10-CM

## 2012-02-21 DIAGNOSIS — J45909 Unspecified asthma, uncomplicated: Secondary | ICD-10-CM | POA: Insufficient documentation

## 2012-02-21 DIAGNOSIS — R569 Unspecified convulsions: Secondary | ICD-10-CM

## 2012-02-21 DIAGNOSIS — R1110 Vomiting, unspecified: Secondary | ICD-10-CM | POA: Insufficient documentation

## 2012-02-21 DIAGNOSIS — Z79899 Other long term (current) drug therapy: Secondary | ICD-10-CM | POA: Insufficient documentation

## 2012-02-21 DIAGNOSIS — R51 Headache: Secondary | ICD-10-CM | POA: Insufficient documentation

## 2012-02-21 DIAGNOSIS — F319 Bipolar disorder, unspecified: Secondary | ICD-10-CM | POA: Insufficient documentation

## 2012-02-21 DIAGNOSIS — S39012A Strain of muscle, fascia and tendon of lower back, initial encounter: Secondary | ICD-10-CM

## 2012-02-21 DIAGNOSIS — G40909 Epilepsy, unspecified, not intractable, without status epilepticus: Secondary | ICD-10-CM | POA: Insufficient documentation

## 2012-02-21 LAB — COMPREHENSIVE METABOLIC PANEL
ALT: 12 U/L (ref 0–35)
AST: 19 U/L (ref 0–37)
Albumin: 3.8 g/dL (ref 3.5–5.2)
Alkaline Phosphatase: 84 U/L (ref 39–117)
Glucose, Bld: 95 mg/dL (ref 70–99)
Potassium: 3.4 mEq/L — ABNORMAL LOW (ref 3.5–5.1)
Sodium: 143 mEq/L (ref 135–145)
Total Protein: 6.7 g/dL (ref 6.0–8.3)

## 2012-02-21 LAB — CBC WITH DIFFERENTIAL/PLATELET
Basophils Absolute: 0 10*3/uL (ref 0.0–0.1)
Basophils Relative: 0 % (ref 0–1)
Eosinophils Absolute: 0.2 10*3/uL (ref 0.0–0.7)
Lymphs Abs: 3.5 10*3/uL (ref 0.7–4.0)
MCH: 32 pg (ref 26.0–34.0)
Neutrophils Relative %: 62 % (ref 43–77)
Platelets: 212 10*3/uL (ref 150–400)
RBC: 3.94 MIL/uL (ref 3.87–5.11)
RDW: 13.6 % (ref 11.5–15.5)

## 2012-02-21 MED ORDER — PREDNISONE 20 MG PO TABS: ORAL_TABLET | ORAL | Status: AC

## 2012-02-21 MED ORDER — HYDROMORPHONE HCL PF 1 MG/ML IJ SOLN
1.0000 mg | Freq: Once | INTRAMUSCULAR | Status: AC
  Administered 2012-02-21: 1 mg via INTRAVENOUS
  Filled 2012-02-21: qty 1

## 2012-02-21 MED ORDER — SODIUM CHLORIDE 0.9 % IV SOLN
INTRAVENOUS | Status: DC
  Administered 2012-02-21: 08:00:00 via INTRAVENOUS

## 2012-02-21 MED ORDER — PREDNISONE 20 MG PO TABS
60.0000 mg | ORAL_TABLET | Freq: Once | ORAL | Status: AC
  Administered 2012-02-21: 60 mg via ORAL
  Filled 2012-02-21: qty 3

## 2012-02-21 MED ORDER — ALBUTEROL SULFATE HFA 108 (90 BASE) MCG/ACT IN AERS: 2.0000 | INHALATION_SPRAY | RESPIRATORY_TRACT | Status: DC | PRN

## 2012-02-21 MED ORDER — HYDROCODONE-ACETAMINOPHEN 5-325 MG PO TABS: 2.0000 | ORAL_TABLET | Freq: Four times a day (QID) | ORAL | Status: AC | PRN

## 2012-02-21 MED ORDER — SODIUM CHLORIDE 0.9 % IV BOLUS (SEPSIS)
2000.0000 mL | Freq: Once | INTRAVENOUS | Status: AC
  Administered 2012-02-21: 2000 mL via INTRAVENOUS

## 2012-02-21 MED ORDER — ONDANSETRON 8 MG PO TBDP: ORAL_TABLET | ORAL | Status: AC

## 2012-02-21 MED ORDER — IPRATROPIUM BROMIDE 0.02 % IN SOLN
0.5000 mg | Freq: Once | RESPIRATORY_TRACT | Status: AC
  Administered 2012-02-21: 0.5 mg via RESPIRATORY_TRACT
  Filled 2012-02-21: qty 2.5

## 2012-02-21 MED ORDER — ONDANSETRON HCL 4 MG/2ML IJ SOLN
4.0000 mg | Freq: Once | INTRAMUSCULAR | Status: AC
  Administered 2012-02-21: 4 mg via INTRAVENOUS
  Filled 2012-02-21: qty 2

## 2012-02-21 MED ORDER — LOPERAMIDE HCL 2 MG PO CAPS: ORAL_CAPSULE | ORAL | Status: AC

## 2012-02-21 MED ORDER — ALBUTEROL SULFATE (5 MG/ML) 0.5% IN NEBU
5.0000 mg | INHALATION_SOLUTION | Freq: Once | RESPIRATORY_TRACT | Status: AC
  Administered 2012-02-21: 5 mg via RESPIRATORY_TRACT
  Filled 2012-02-21: qty 1

## 2012-02-21 MED ORDER — METOCLOPRAMIDE HCL 10 MG PO TABS: 10.0000 mg | ORAL_TABLET | Freq: Four times a day (QID) | ORAL | Status: DC | PRN

## 2012-02-21 NOTE — ED Notes (Signed)
States she had pain in her chest tonight after she fell backwards and hit arm of sofa when having a seizure.  C/o pain in anterior chest and also lower back area

## 2012-02-21 NOTE — ED Provider Notes (Signed)
History  This chart was scribed for [name] found by Shari Heritage. The patient was seen in room APA09/APA09. Patient's care was started at [phone].     CSN: [id number]  Arrival date & time 02/21/12  0611   First MD Initiated Contact with Patient 02/21/12 [phone]      Chief Complaint  Patient presents with  . Seizures  . Chest Pain    The history is provided by the patient. No language interpreter was used.   [name] is a 31 y.o. female who presents to the Emergency Department complaining of a seizure that occurred earlier this morning. There is new HA pain, chest pain and lower back pain that radiates down her left leg resulting from a fall that occurred during the seizure. She also complains of numbness and tingling in her left thigh. Patient says that she was up all night taking care of her 71-year old son. Patient lives with her mother and states that her mother witnessed the seizure.  Patient also complains of diffuse, constant abdominal  pain, vomiting, diarrhea, body aches, wheezing, SOB, fever, chills, nasal congestion, sore throat and cough onset several days. Patient has had several episodes of vomiting and diarrhea per day. She reports that she hasn't been able to keep solid food down. She states that her maximum temperature has been 102. There is no dysuria or vaginal discharge. Patient has a history of chronic back pain, seizures, bipolar 1 and past cocaine abuse.   Past Medical History  Diagnosis Date  . Seizures   . Bipolar 1 disorder   . Asthma   . Substance abuse     Past Surgical History  Procedure Date  . Cholecystectomy   . Abdominal hysterectomy   . Abdominal surgery     History  Substance Use Topics  . Smoking status: Current Everyday Smoker -- 1.0 packs/day    Types: Cigarettes  . Smokeless tobacco: Not on file  . Alcohol Use: 1.2 oz/week    2 Cans of beer per week     one beer at 4pm Friday afternoon    OB History    Grav Para Term  Preterm Abortions TAB SAB Ect Mult Living                  Review of Systems 10 Systems reviewed and all are negative for acute change except as noted in the HPI.   Allergies  Metronidazole; Penicillins; Propoxyphene-acetaminophen; and Rocephin  Home Medications   Current Outpatient Rx  Name Route Sig Dispense Refill  . ALBUTEROL SULFATE HFA 108 (90 BASE) MCG/ACT IN AERS Inhalation Inhale 2 puffs into the lungs every 6 (six) hours as needed. Shortness of breath    . CARBAMAZEPINE 100 MG PO CHEW Oral Chew 150 mg by mouth 3 (three) times daily.      Marland Kitchen GABAPENTIN 300 MG PO CAPS Oral Take 300 mg by mouth 3 (three) times daily.      Marland Kitchen HYDROCODONE-ACETAMINOPHEN 10-325 MG PO TABS Oral Take 1 tablet by mouth every 6 (six) hours as needed. 12 tablet 0  . OMEPRAZOLE 20 MG PO CPDR Oral Take 1 capsule (20 mg total) by mouth daily. 21 capsule 0  . ALBUTEROL SULFATE HFA 108 (90 BASE) MCG/ACT IN AERS Inhalation Inhale 2 puffs into the lungs every 2 (two) hours as needed for wheezing or shortness of breath (cough). 1 Inhaler 0  . HYDROCODONE-ACETAMINOPHEN 5-325 MG PO TABS Oral Take 2 tablets by mouth every 6 (  six) hours as needed for pain. 10 tablet 0  . LOPERAMIDE HCL 2 MG PO CAPS  Take two tabs po initially, then one tab after each loose stool: max 8 tabs in 24 hours 12 capsule 0  . METOCLOPRAMIDE HCL 10 MG PO TABS Oral Take 1 tablet (10 mg total) by mouth every 6 (six) hours as needed (nausea/headache). 6 tablet 0  . ONDANSETRON 8 MG PO TBDP  8mg  ODT q4 hours prn nausea 4 tablet 0  . PREDNISONE 20 MG PO TABS  2 tabs po daily x 4 days 8 tablet 0    BP 108/68  Pulse 86  Resp 20  Ht 5\' 8"  (1.727 m)  Wt 145 lb (65.772 kg)  BMI 22.05 kg/m2  SpO2 99%  Physical Exam  Nursing note and vitals reviewed. Constitutional: She is oriented to person, place, and time.       Awake, alert, nontoxic appearance with baseline speech for patient.  HENT:  Head: Atraumatic.  Mouth/Throat: No oropharyngeal  exudate.  Eyes: EOM are normal. Pupils are equal, round, and reactive to light. Right eye exhibits no discharge. Left eye exhibits no discharge.  Neck: Neck supple.  Cardiovascular: Regular rhythm.  Tachycardia present.   No murmur heard. Pulmonary/Chest: Effort normal. No stridor. No respiratory distress. She has wheezes. She has rhonchi. She has no rales. She exhibits no tenderness.       Mild, diffuse anterior chest wall tenderness. Few scattered wheezes and rhonchi.  Abdominal: Soft. Bowel sounds are normal. She exhibits no mass. There is generalized tenderness. There is no rebound.       Mild diffuse abdominal tenderness.  Musculoskeletal: She exhibits no tenderness.       Cervical back: She exhibits tenderness.       Lumbar back: She exhibits tenderness.       Baseline ROM, moves extremities with no obvious new focal weakness. 5/5 strength in bilateral upper extremities. Slight decreased sensation to light touch to the entire left leg.  Mild diffuse lumbar and paralumbar tenderness. Diffuse, mild cervical tenderness.  Lymphadenopathy:    She has no cervical adenopathy.  Neurological: She is alert and oriented to person, place, and time.       Awake, alert, cooperative and aware of situation; motor strength bilaterally; sensation normal to light touch bilaterally except slight decreased sensation to light touch to the entire left leg.; peripheral visual fields full to confrontation; no facial asymmetry; tongue midline; major cranial nerves appear intact; no pronator drift, normal finger to nose bilaterally; symmetric reflexes intact in legs bilaterally.   Skin: No rash noted.  Psychiatric: She has a normal mood and affect.    ED Course  Procedures (including critical care time) ECG: Sinus tachycardia, ventricular rate 103, normal axis, normal intervals, no acute ischemic changes noted, no significant change compared to February 2013  DIAGNOSTIC STUDIES: Oxygen Saturation is 93% on  room air, adequate by my interpretation.    COORDINATION OF CARE: 7:23am- Patient / Family / Caregiver understand and agree with initial ED impression and plan with expectations set for ED visit.   9:37am- Pt stable in ED with no significant deterioration in condition. Patient / Family / Caregiver informed of clinical course, understand medical decision-making process, and agree with plan. Patient admits non-compliance. She hasn't been taking her seizure medication as instructed. Patient has a prescription for tegretol that she can fill, but has not done so.     Labs Reviewed  CBC WITH DIFFERENTIAL - Abnormal; Notable  for the following:    WBC 11.1 (*)     All other components within normal limits  COMPREHENSIVE METABOLIC PANEL - Abnormal; Notable for the following:    Potassium 3.4 (*)     Total Bilirubin 0.1 (*)     All other components within normal limits  CARBAMAZEPINE LEVEL, TOTAL - Abnormal; Notable for the following:    Carbamazepine Lvl 0.0 (*)     All other components within normal limits  LIPASE, BLOOD    Dg Chest 2 View  02/21/2012  *RADIOLOGY REPORT*  Clinical Data: Seizure.  Headache  CHEST - 2 VIEW  Comparison: 08/16/2011  Findings: The heart size and mediastinal contours are within normal limits.  Both lungs are clear.  The visualized skeletal structures are unremarkable.  IMPRESSION: Negative exam.   Original Report Authenticated By: Rosealee Albee, M.D.    Dg Lumbar Spine Complete  02/21/2012  *RADIOLOGY REPORT*  Clinical Data: Pain after fall.  LUMBAR SPINE - COMPLETE 4+ VIEW  Comparison: 08/02/2011  Findings: There is a mild curvature lumbar spine which is convex to the left.  The vertebral body heights are well preserved.  Disc spaces are normal.  No fracture or subluxation identified.  No radio-opaque foreign bodies or soft tissue calcifications.  IMPRESSION:  1.  No acute findings.   Original Report Authenticated By: Rosealee Albee, M.D.    Ct Head Wo  Contrast  02/21/2012  *RADIOLOGY REPORT*  Clinical Data:  Fall with headache and seizure.  CT HEAD WITHOUT CONTRAST CT CERVICAL SPINE WITHOUT CONTRAST  Technique:  Multidetector CT imaging of the head and cervical spine was performed following the standard protocol without intravenous contrast.  Multiplanar CT image reconstructions of the cervical spine were also generated.  Comparison:   None  CT HEAD  Findings: Normal appearance of the intracranial structures.  No evidence for acute hemorrhage, mass lesion, midline shift, hydrocephalus or large infarct.  There may be a small amount of fluid within the right maxillary sinus.  Mucosal disease in the right sphenoid sinus.  No acute bony abnormality.  IMPRESSION: No acute intracranial normality.  Paranasal sinus disease.  CT CERVICAL SPINE  Findings: Mucosal disease in the maxillary sinuses and right sphenoid sinus.  The mastoid air cells are well-aerated.  Pterygoid plates are intact.  Mandibular condyles are located.  There are small blebs at the lung apices and similar to the chest CT from 07/27/2006.  No evidence for a pneumothorax.  Negative for acute fracture or dislocation. Normal alignment of the cervical spine. No significant soft tissue swelling or edema within the neck soft tissues.  IMPRESSION:  No acute bony abnormality in the cervical spine.  Paranasal sinus disease.   Original Report Authenticated By: Richarda Overlie, M.D.    Ct Cervical Spine Wo Contrast  02/21/2012  *RADIOLOGY REPORT*  Clinical Data:  Fall with headache and seizure.  CT HEAD WITHOUT CONTRAST CT CERVICAL SPINE WITHOUT CONTRAST  Technique:  Multidetector CT imaging of the head and cervical spine was performed following the standard protocol without intravenous contrast.  Multiplanar CT image reconstructions of the cervical spine were also generated.  Comparison:   None  CT HEAD  Findings: Normal appearance of the intracranial structures.  No evidence for acute hemorrhage, mass lesion,  midline shift, hydrocephalus or large infarct.  There may be a small amount of fluid within the right maxillary sinus.  Mucosal disease in the right sphenoid sinus.  No acute bony abnormality.  IMPRESSION: No acute  intracranial normality.  Paranasal sinus disease.  CT CERVICAL SPINE  Findings: Mucosal disease in the maxillary sinuses and right sphenoid sinus.  The mastoid air cells are well-aerated.  Pterygoid plates are intact.  Mandibular condyles are located.  There are small blebs at the lung apices and similar to the chest CT from 07/27/2006.  No evidence for a pneumothorax.  Negative for acute fracture or dislocation. Normal alignment of the cervical spine. No significant soft tissue swelling or edema within the neck soft tissues.  IMPRESSION:  No acute bony abnormality in the cervical spine.  Paranasal sinus disease.   Original Report Authenticated By: Richarda Overlie, M.D.      1. Seizure   2. Seizure disorder   3. Abdominal pain   4. Vomiting and diarrhea   5. Bronchitis with bronchospasm   6. Minor head injury   7. Cervical strain, acute   8. Lumbar strain   9. Sciatica of left side       MDM       I personally performed the services described in this documentation, which was scribed in my presence. The recorded information has been reviewed and considered.    Hurman Horn, MD 02/21/12 [phone]

## 2012-02-21 NOTE — Progress Notes (Signed)
Date: 02/21/2012  4098  Rate: 103  Rhythm: sinus tachycardia  QRS Axis: normal  Intervals: normal  ST/T Wave abnormalities: normal  Conduction Disutrbances: none  Narrative Interpretation: unremarkable

## 2012-02-21 NOTE — ED Notes (Signed)
Also states she called her social worker at the 1-800 number and they are going to investigate the situation with her son.  Patient currently lives with her mother and son

## 2012-04-18 ENCOUNTER — Emergency Department (HOSPITAL_COMMUNITY): Payer: Medicaid Other

## 2012-04-18 ENCOUNTER — Encounter (HOSPITAL_COMMUNITY): Payer: Self-pay | Admitting: Emergency Medicine

## 2012-04-18 ENCOUNTER — Emergency Department (HOSPITAL_COMMUNITY)
Admission: EM | Admit: 2012-04-18 | Discharge: 2012-04-18 | Disposition: A | Discharge status: Home / Self Care | Payer: Medicaid Other | Attending: Emergency Medicine | Admitting: Emergency Medicine

## 2012-04-18 DIAGNOSIS — M549 Dorsalgia, unspecified: Secondary | ICD-10-CM | POA: Insufficient documentation

## 2012-04-18 DIAGNOSIS — G40909 Epilepsy, unspecified, not intractable, without status epilepticus: Secondary | ICD-10-CM

## 2012-04-18 DIAGNOSIS — G8929 Other chronic pain: Secondary | ICD-10-CM

## 2012-04-18 DIAGNOSIS — Z79899 Other long term (current) drug therapy: Secondary | ICD-10-CM | POA: Insufficient documentation

## 2012-04-18 DIAGNOSIS — J069 Acute upper respiratory infection, unspecified: Secondary | ICD-10-CM

## 2012-04-18 DIAGNOSIS — Z9089 Acquired absence of other organs: Secondary | ICD-10-CM | POA: Insufficient documentation

## 2012-04-18 HISTORY — DX: Other chronic pain: G89.29

## 2012-04-18 HISTORY — DX: Sciatica, unspecified side: M54.30

## 2012-04-18 HISTORY — DX: Dorsalgia, unspecified: M54.9

## 2012-04-18 LAB — RAPID STREP SCREEN (MED CTR MEBANE ONLY): Streptococcus, Group A Screen (Direct): NEGATIVE

## 2012-04-18 MED ORDER — CARBAMAZEPINE 200 MG PO TABS
200.0000 mg | ORAL_TABLET | Freq: Once | ORAL | Status: AC
  Administered 2012-04-18: 200 mg via ORAL
  Filled 2012-04-18: qty 1

## 2012-04-18 MED ORDER — OXYCODONE-ACETAMINOPHEN 5-325 MG PO TABS
1.0000 | ORAL_TABLET | Freq: Once | ORAL | Status: AC
  Administered 2012-04-18: 1 via ORAL
  Filled 2012-04-18: qty 1

## 2012-04-18 MED ORDER — HYDROCODONE-ACETAMINOPHEN 5-325 MG PO TABS: ORAL_TABLET | ORAL | Status: DC

## 2012-04-18 MED ORDER — BENZONATATE 100 MG PO CAPS: 100.0000 mg | ORAL_CAPSULE | Freq: Three times a day (TID) | ORAL | Status: DC | PRN

## 2012-04-18 NOTE — ED Notes (Signed)
Patient c/o cough and congestion x 2 weeks.  Also states had a seizure earlier today and fell and hit her back on the arm of a chair. Patient c/o lower back pain.

## 2012-04-18 NOTE — ED Provider Notes (Signed)
History     CSN: [id number]  Arrival date & time 04/18/12  2037   First MD Initiated Contact with Patient 04/18/12 2047      Chief Complaint  Patient presents with  . Cough  . Nasal Congestion  . Fall     HPI Pt was seen at 2055.  Per pt, c/o gradual onset and persistence of sore throat and cough for the past 2 weeks. Denies fevers, no rash, no CP/SOB, no back pain, no abd pain, no N/V/D. Pt also states she "had a seizure today" which caused an acute flair of her chronic low back pain.  Denies any change in her usual chronic pain pattern.  Pain worsens with palpation of the area and body position changes. Denies incont/retention of bowel or bladder, no saddle anesthesia, no focal motor weakness, no tingling/numbness in extremities, no fevers. The patient has a significant history of similar symptoms previously, recently being evaluated for this complaint and multiple prior evals for same.     Past Medical History  Diagnosis Date  . Seizures   . Bipolar 1 disorder   . Asthma   . Substance abuse   . Chronic back pain   . Sciatica     Past Surgical History  Procedure Date  . Cholecystectomy   . Abdominal hysterectomy   . Abdominal surgery     History  Substance Use Topics  . Smoking status: Current Every Day Smoker -- 1.0 packs/day    Types: Cigarettes  . Smokeless tobacco: Not on file  . Alcohol Use: 1.2 oz/week    2 Cans of beer per week     one beer at 4pm Friday afternoon    Review of Systems ROS: Statement: All systems negative except as marked or noted in the HPI; Constitutional: Negative for fever and chills. ; ; Eyes: Negative for eye pain, redness and discharge. ; ; ENMT: Negative for ear pain, hoarseness, nasal congestion, sinus pressure and +sore throat. ; ; Cardiovascular: Negative for chest pain, palpitations, diaphoresis, dyspnea and peripheral edema. ; ; Respiratory: +cough. Negative for wheezing and stridor. ; ; Gastrointestinal: Negative for nausea,  vomiting, diarrhea, abdominal pain, blood in stool, hematemesis, jaundice and rectal bleeding. . ; ; Genitourinary: Negative for dysuria, flank pain and hematuria. ; ; Musculoskeletal: +LPB. Negative for neck pain. Negative for swelling and trauma.; ; Skin: Negative for pruritus, rash, abrasions, blisters, bruising and skin lesion.; ; Neuro: Negative for headache, lightheadedness and neck stiffness. Negative for weakness, altered mental status, extremity weakness, paresthesias, involuntary movement, +seizure.       Allergies  Metronidazole; Penicillins; Propoxyphene-acetaminophen; and Rocephin  Home Medications   Current Outpatient Rx  Name Route Sig Dispense Refill  . ALBUTEROL SULFATE HFA 108 (90 BASE) MCG/ACT IN AERS Inhalation Inhale 2 puffs into the lungs every 6 (six) hours as needed. Shortness of breath    . CARBAMAZEPINE 100 MG PO CHEW Oral Chew 150 mg by mouth 3 (three) times daily.      Marland Kitchen BENZONATATE 100 MG PO CAPS Oral Take 1 capsule (100 mg total) by mouth 3 (three) times daily as needed for cough. 20 capsule 0  . GABAPENTIN 300 MG PO CAPS Oral Take 300 mg by mouth 3 (three) times daily.      Marland Kitchen HYDROCODONE-ACETAMINOPHEN 5-325 MG PO TABS  1 or 2 tabs PO q6 hours prn pain 20 tablet 0  . METOCLOPRAMIDE HCL 10 MG PO TABS Oral Take 1 tablet (10 mg total) by mouth every 6 (  six) hours as needed (nausea/headache). 6 tablet 0    BP 125/67  Pulse 110  Temp 98.1 F (36.7 C) (Oral)  Resp 22  Ht 5\' 8"  (1.727 m)  Wt 145 lb (65.772 kg)  BMI 22.05 kg/m2  SpO2 99%  Physical Exam 2100: Physical examination:  Nursing notes reviewed; Vital signs and O2 SAT reviewed;  Constitutional: Well developed, Well nourished, Well hydrated, In no acute distress; Head:  Normocephalic, atraumatic; Eyes: EOMI, PERRL, No scleral icterus; ENMT: TM's clear bilat. +edemetous nasal turbinates bilat with clear rhinorrhea. Mouth and pharynx normal, Mucous membranes moist. No hoarse voice, no drooling, no stridor.  No intra-oral edema or injury.; Neck: Supple, Full range of motion, No lymphadenopathy; Cardiovascular: Regular rate and rhythm, No murmur, rub, or gallop; Respiratory: Breath sounds clear & equal bilaterally, No rales, rhonchi, wheezes.  Speaking full sentences with ease, Normal respiratory effort/excursion; Chest: Nontender, Movement normal; Abdomen: Soft, Nontender, Nondistended, Normal bowel sounds; Genitourinary: No CVA tenderness; Spine:  No midline CS, TS, LS tenderness. +TTP lumbar paraspinal muscles.;; Extremities: Pulses normal, No tenderness, No edema, No calf edema or asymmetry.; Neuro: AA&Ox3, Major CN grossly intact.  Speech clear.  No facial droop. Gait steady. Climbs on and off stretcher by herself without difficulty. Gait steady. No gross focal motor or sensory deficits in extremities.; Skin: Color normal, Warm, Dry.   ED Course  Procedures     MDM  MDM Reviewed: previous chart, nursing note and vitals Interpretation: labs and x-ray     2230:  Appears URI at this time; will tx symptomatically.  Tegretol subtherapeutic, will give dose here; strongly encouraged to take her seizure meds.  Requesting "some pain meds" for acute flair of her chronic back pain that began "after my seizure today."  Long hx of same with multiple ED visits, no change in her usual chronic pain pattern.  Pt wants to go home now.  Dx and testing d/w pt.  Questions answered.  Verb understanding, agreeable to d/c home with outpt f/u.        Laray Anger, DO 04/19/12 1605

## 2012-05-29 ENCOUNTER — Emergency Department (HOSPITAL_COMMUNITY)
Admission: EM | Admit: 2012-05-29 | Discharge: 2012-05-29 | Disposition: A | Discharge status: Home / Self Care | Payer: Medicaid Other | Attending: Emergency Medicine | Admitting: Emergency Medicine

## 2012-05-29 ENCOUNTER — Encounter (HOSPITAL_COMMUNITY): Payer: Self-pay | Admitting: Emergency Medicine

## 2012-05-29 ENCOUNTER — Emergency Department (HOSPITAL_COMMUNITY)
Admission: EM | Admit: 2012-05-29 | Discharge: 2012-05-29 | Disposition: A | Discharge status: Home / Self Care | Payer: Medicaid Other | Source: Home / Self Care | Attending: Emergency Medicine | Admitting: Emergency Medicine

## 2012-05-29 DIAGNOSIS — Z79899 Other long term (current) drug therapy: Secondary | ICD-10-CM | POA: Insufficient documentation

## 2012-05-29 DIAGNOSIS — K047 Periapical abscess without sinus: Secondary | ICD-10-CM | POA: Insufficient documentation

## 2012-05-29 DIAGNOSIS — M549 Dorsalgia, unspecified: Secondary | ICD-10-CM | POA: Insufficient documentation

## 2012-05-29 DIAGNOSIS — J45909 Unspecified asthma, uncomplicated: Secondary | ICD-10-CM | POA: Insufficient documentation

## 2012-05-29 DIAGNOSIS — Z792 Long term (current) use of antibiotics: Secondary | ICD-10-CM | POA: Insufficient documentation

## 2012-05-29 DIAGNOSIS — M5430 Sciatica, unspecified side: Secondary | ICD-10-CM | POA: Insufficient documentation

## 2012-05-29 DIAGNOSIS — G8929 Other chronic pain: Secondary | ICD-10-CM | POA: Insufficient documentation

## 2012-05-29 DIAGNOSIS — K0889 Other specified disorders of teeth and supporting structures: Secondary | ICD-10-CM

## 2012-05-29 DIAGNOSIS — F319 Bipolar disorder, unspecified: Secondary | ICD-10-CM | POA: Insufficient documentation

## 2012-05-29 DIAGNOSIS — R221 Localized swelling, mass and lump, neck: Secondary | ICD-10-CM | POA: Insufficient documentation

## 2012-05-29 DIAGNOSIS — R220 Localized swelling, mass and lump, head: Secondary | ICD-10-CM | POA: Insufficient documentation

## 2012-05-29 DIAGNOSIS — R1110 Vomiting, unspecified: Secondary | ICD-10-CM | POA: Insufficient documentation

## 2012-05-29 DIAGNOSIS — F17200 Nicotine dependence, unspecified, uncomplicated: Secondary | ICD-10-CM | POA: Insufficient documentation

## 2012-05-29 DIAGNOSIS — R569 Unspecified convulsions: Secondary | ICD-10-CM | POA: Insufficient documentation

## 2012-05-29 MED ORDER — AMOXICILLIN 250 MG PO CAPS
500.0000 mg | ORAL_CAPSULE | Freq: Once | ORAL | Status: AC
  Administered 2012-05-29: 500 mg via ORAL
  Filled 2012-05-29: qty 2

## 2012-05-29 MED ORDER — HYDROCODONE-ACETAMINOPHEN 5-325 MG PO TABS: 2.0000 | ORAL_TABLET | ORAL | Status: DC | PRN

## 2012-05-29 MED ORDER — ONDANSETRON HCL 4 MG PO TABS: 4.0000 mg | ORAL_TABLET | Freq: Four times a day (QID) | ORAL | Status: DC

## 2012-05-29 MED ORDER — ONDANSETRON 4 MG PO TBDP
8.0000 mg | ORAL_TABLET | ORAL | Status: AC
  Administered 2012-05-29: 8 mg via ORAL
  Filled 2012-05-29: qty 2

## 2012-05-29 MED ORDER — AMOXICILLIN 500 MG PO CAPS: 500.0000 mg | ORAL_CAPSULE | Freq: Three times a day (TID) | ORAL | Status: DC

## 2012-05-29 MED ORDER — OXYCODONE-ACETAMINOPHEN 5-325 MG PO TABS
1.0000 | ORAL_TABLET | Freq: Once | ORAL | Status: AC
  Administered 2012-05-29: 1 via ORAL
  Filled 2012-05-29: qty 1

## 2012-05-29 MED ORDER — CLINDAMYCIN HCL 150 MG PO CAPS: 150.0000 mg | ORAL_CAPSULE | Freq: Four times a day (QID) | ORAL | Status: DC

## 2012-05-29 MED ORDER — OXYCODONE-ACETAMINOPHEN 5-325 MG PO TABS
2.0000 | ORAL_TABLET | Freq: Once | ORAL | Status: AC
  Administered 2012-05-29: 2 via ORAL
  Filled 2012-05-29: qty 2

## 2012-05-29 MED ORDER — CLINDAMYCIN HCL 150 MG PO CAPS
300.0000 mg | ORAL_CAPSULE | Freq: Once | ORAL | Status: AC
  Administered 2012-05-29: 300 mg via ORAL
  Filled 2012-05-29: qty 2

## 2012-05-29 NOTE — ED Notes (Signed)
Patient c/o right sided dental pain.  Facial swelling noted to right side.  Patient states has appointment on 12/17 to get all her teeth removed.

## 2012-05-29 NOTE — ED Provider Notes (Signed)
History     CSN: [id number]  Arrival date & time 05/29/12  0203   First MD Initiated Contact with Patient 05/29/12 0228      Chief Complaint  Patient presents with  . Dental Pain    (Consider location/radiation/quality/duration/timing/severity/associated sxs/prior treatment) HPI Comments: 31-year-old female with a history of poor dentition, seizure disorder and substance abuse who presents with a complaint of pain to her right upper jaw. She states that this pain has been gradual in onset, gradually worsening and has become moderate to severe. It is associated with subjective fevers but no difficulty speaking, swallowing or breathing. It gets worse with palpation. She does have a history of very poor dentition and is being seen by a dentist he wants to pull all of her teeth. This is going to have the next 3 weeks but she is unable to get to the dentist any sooner due to scheduling problems.  Patient is a 31 y.o. female presenting with tooth pain. The history is provided by the patient and medical records.  Dental PainPrimary symptoms do not include fever or sore throat.  Additional symptoms include: facial swelling. Additional symptoms do not include: trouble swallowing.    Past Medical History  Diagnosis Date  . Seizures   . Bipolar 1 disorder   . Asthma   . Substance abuse   . Chronic back pain   . Sciatica     Past Surgical History  Procedure Date  . Cholecystectomy   . Abdominal hysterectomy   . Abdominal surgery     No family history on file.  History  Substance Use Topics  . Smoking status: Current Every Day Smoker -- 1.0 packs/day    Types: Cigarettes  . Smokeless tobacco: Not on file  . Alcohol Use: 1.2 oz/week    2 Cans of beer per week    OB History    Grav Para Term Preterm Abortions TAB SAB Ect Mult Living                  Review of Systems  Unable to perform ROS Constitutional: Negative for fever and chills.  HENT: Positive for facial swelling  and dental problem. Negative for sore throat, trouble swallowing and voice change.        Toothache  Gastrointestinal: Negative for nausea and vomiting.    Allergies  Metronidazole; Penicillins; Propoxyphene-acetaminophen; and Rocephin  Home Medications   Current Outpatient Rx  Name  Route  Sig  Dispense  Refill  . ALBUTEROL SULFATE HFA 108 (90 BASE) MCG/ACT IN AERS   Inhalation   Inhale 2 puffs into the lungs every 6 (six) hours as needed. Shortness of breath         . AMOXICILLIN 500 MG PO CAPS   Oral   Take 1 capsule (500 mg total) by mouth 3 (three) times daily.   21 capsule   0   . BENZONATATE 100 MG PO CAPS   Oral   Take 1 capsule (100 mg total) by mouth 3 (three) times daily as needed for cough.   20 capsule   0   . CARBAMAZEPINE 100 MG PO CHEW   Oral   Chew 150 mg by mouth 3 (three) times daily.           Marland Kitchen GABAPENTIN 300 MG PO CAPS   Oral   Take 300 mg by mouth 3 (three) times daily.           Marland Kitchen HYDROCODONE-ACETAMINOPHEN 5-325 MG PO TABS  1 or 2 tabs PO q6 hours prn pain   20 tablet   0   . HYDROCODONE-ACETAMINOPHEN 5-325 MG PO TABS   Oral   Take 2 tablets by mouth every 4 (four) hours as needed for pain.   6 tablet   0   . METOCLOPRAMIDE HCL 10 MG PO TABS   Oral   Take 1 tablet (10 mg total) by mouth every 6 (six) hours as needed (nausea/headache).   6 tablet   0     BP 139/88  Pulse 122  Temp 97.8 F (36.6 C) (Oral)  Resp 20  Ht 5\' 8"  (1.727 m)  Wt 140 lb (63.504 kg)  BMI 21.29 kg/m2  SpO2 95%  Physical Exam  Nursing note and vitals reviewed. Constitutional: She appears well-developed and well-nourished. No distress.  HENT:  Head: Normocephalic and atraumatic.  Mouth/Throat: Oropharynx is clear and moist. No oropharyngeal exudate.       Dental Disease - diffuse degeneration of the teeth, tenderness over the right upper gums with mild asymmetry to the upper jaw, no palpable gingival abscess, no pain in the sublingual area, no  masses in the sublingual area  Eyes: Conjunctivae normal are normal. No scleral icterus.  Neck: Normal range of motion. Neck supple. No thyromegaly present.       No lymphadenopathy of the neck, very supple, full range of motion  Cardiovascular: Regular rhythm.        Mild tachycardia  Pulmonary/Chest: Effort normal and breath sounds normal.  Lymphadenopathy:    She has no cervical adenopathy.  Neurological: She is alert.  Skin: Skin is warm and dry. No rash noted. She is not diaphoretic.    ED Course  Procedures (including critical care time)  Labs Reviewed - No data to display No results found.   1. Dental abscess       MDM  Overall the patient appears to be nontoxic she does have focal tenderness to her right face. She is tolerating oral medications and fluids without any vomiting, she does not have a fever, or tachycardia is improving since initial vital signs, pain medications given, antibiotics given, home with same to followup with dentist.   Discharge Prescriptions include:   Percocet  Amoxicillin        Vida Roller, MD 05/29/12 [phone]

## 2012-05-29 NOTE — ED Notes (Signed)
No further nausea or vomiting. 

## 2012-05-29 NOTE — ED Notes (Signed)
Pt c/o right upper dental pain; pt was seen at [facility] last night and given meds but sts worse this am

## 2012-05-29 NOTE — ED Provider Notes (Signed)
History   This chart was scribed for Ethelda Chick, MD scribed by Magnus Sinning. The patient was seen in room TR08C/TR08C at  12:23  CSN: [id number]  Arrival date & time 05/29/12  1149   None     Chief Complaint  Patient presents with  . Dental Pain    (Consider location/radiation/quality/duration/timing/severity/associated sxs/prior treatment) HPI Comments: [name] is a 31 y.o. female who presents to the Emergency Department complaining of constant moderate right upper dental pain,onset two days. The patient was seen last night at [facility] and given medications, but she reports that her sxs were worsened this morning.  She explains she was given a percocet and other medications, but provides she has been able to keep her medications down, due to episodes of emesis and nausea.   Patient is a 31 y.o. female presenting with tooth pain. The history is provided by the patient. No language interpreter was used.  Dental PainThe symptoms began 2 days ago. The symptoms are worsening. The symptoms are recurrent. The symptoms occur constantly.  Additional symptoms include: facial swelling. Additional symptoms do not include: trouble swallowing.     Past Medical History  Diagnosis Date  . Seizures   . Bipolar 1 disorder   . Asthma   . Substance abuse   . Chronic back pain   . Sciatica     Past Surgical History  Procedure Date  . Cholecystectomy   . Abdominal hysterectomy   . Abdominal surgery     History reviewed. No pertinent family history.  History  Substance Use Topics  . Smoking status: Current Every Day Smoker -- 1.0 packs/day    Types: Cigarettes  . Smokeless tobacco: Not on file  . Alcohol Use: 1.2 oz/week    2 Cans of beer per week    Review of Systems  HENT: Positive for facial swelling. Negative for trouble swallowing.   All other systems reviewed and are negative.    Allergies  Metronidazole; Penicillins; Propoxyphene-acetaminophen; and  Rocephin  Home Medications   Current Outpatient Rx  Name  Route  Sig  Dispense  Refill  . ALBUTEROL SULFATE HFA 108 (90 BASE) MCG/ACT IN AERS   Inhalation   Inhale 2 puffs into the lungs every 6 (six) hours as needed. Shortness of breath         . CARBAMAZEPINE 100 MG PO CHEW   Oral   Chew 150 mg by mouth 3 (three) times daily.           Marland Kitchen CLINDAMYCIN HCL 300 MG PO CAPS   Oral   Take 600 mg by mouth 3 (three) times daily. For 10 days. Started 05/28/12         . GABAPENTIN 300 MG PO CAPS   Oral   Take 300 mg by mouth 3 (three) times daily.           Marland Kitchen CLINDAMYCIN HCL 150 MG PO CAPS   Oral   Take 1 capsule (150 mg total) by mouth every 6 (six) hours.   28 capsule   0   . HYDROCODONE-ACETAMINOPHEN 5-325 MG PO TABS   Oral   Take 2 tablets by mouth every 4 (four) hours as needed for pain.   6 tablet   0   . ONDANSETRON HCL 4 MG PO TABS   Oral   Take 1 tablet (4 mg total) by mouth every 6 (six) hours.   12 tablet   0     BP 131/75  Pulse 100  Temp 98.1 F (36.7 C) (Oral)  Resp 16  SpO2 96%  Physical Exam  Nursing note and vitals reviewed. Constitutional: She is oriented to person, place, and time. She appears well-developed and well-nourished. No distress.  HENT:  Head: Normocephalic and atraumatic.       Widespread dental decay . No tenderness of gingiva but mild swelling overlying the right mandible. No swelling of the tongue. No palpable gingival abscess.   Eyes: Conjunctivae normal and EOM are normal.  Neck: Neck supple. No tracheal deviation present.  Cardiovascular: Normal rate.   Pulmonary/Chest: Effort normal. No respiratory distress.  Abdominal: She exhibits no distension.  Musculoskeletal: Normal range of motion.  Neurological: She is alert and oriented to person, place, and time. No sensory deficit.  Skin: Skin is dry.  Psychiatric: She has a normal mood and affect. Her behavior is normal.    ED Course  Procedures (including critical  care time) DIAGNOSTIC STUDIES: Oxygen Saturation is 96% on room air, normal by my interpretation.    COORDINATION OF CARE:  Labs Reviewed - No data to display No results found.   1. Pain, dental   2. Dental abscess       MDM  Pt presents with c/o continued dental and facial pain.  She states that she is vomiting and not able to keep down the pain meds and antibotics given at ED visit last night.  Pt given zofran ODT today- also given dose of pain meds and clindamycin- due to her hx of PCN allergy.  She has tolerated this and a fluid trial.  She was instructed to arrange for f/u with her dentist as soon as possible.  Discharged with strict return precautions.  Pt agreeable with plan.  I personally performed the services described in this documentation, which was scribed in my presence. The recorded information has been reviewed and is accurate.         Ethelda Chick, MD 05/29/12 [phone]

## 2012-06-01 MED FILL — Hydrocodone-Acetaminophen Tab 5-325 MG: ORAL | Qty: 6 | Status: AC

## 2012-08-02 ENCOUNTER — Encounter (HOSPITAL_COMMUNITY): Payer: Self-pay | Admitting: *Deleted

## 2012-08-02 ENCOUNTER — Emergency Department (HOSPITAL_COMMUNITY)
Admission: EM | Admit: 2012-08-02 | Discharge: 2012-08-02 | Disposition: A | Discharge status: Home / Self Care | Payer: Medicaid Other | Attending: Emergency Medicine | Admitting: Emergency Medicine

## 2012-08-02 ENCOUNTER — Emergency Department (HOSPITAL_COMMUNITY): Payer: Medicaid Other

## 2012-08-02 DIAGNOSIS — R0602 Shortness of breath: Secondary | ICD-10-CM | POA: Insufficient documentation

## 2012-08-02 DIAGNOSIS — Z8739 Personal history of other diseases of the musculoskeletal system and connective tissue: Secondary | ICD-10-CM | POA: Insufficient documentation

## 2012-08-02 DIAGNOSIS — F17200 Nicotine dependence, unspecified, uncomplicated: Secondary | ICD-10-CM | POA: Insufficient documentation

## 2012-08-02 DIAGNOSIS — R109 Unspecified abdominal pain: Secondary | ICD-10-CM | POA: Insufficient documentation

## 2012-08-02 DIAGNOSIS — Z79899 Other long term (current) drug therapy: Secondary | ICD-10-CM | POA: Insufficient documentation

## 2012-08-02 DIAGNOSIS — J3489 Other specified disorders of nose and nasal sinuses: Secondary | ICD-10-CM | POA: Insufficient documentation

## 2012-08-02 DIAGNOSIS — G40909 Epilepsy, unspecified, not intractable, without status epilepticus: Secondary | ICD-10-CM | POA: Insufficient documentation

## 2012-08-02 DIAGNOSIS — F1910 Other psychoactive substance abuse, uncomplicated: Secondary | ICD-10-CM | POA: Insufficient documentation

## 2012-08-02 DIAGNOSIS — R300 Dysuria: Secondary | ICD-10-CM

## 2012-08-02 DIAGNOSIS — R509 Fever, unspecified: Secondary | ICD-10-CM | POA: Insufficient documentation

## 2012-08-02 DIAGNOSIS — R062 Wheezing: Secondary | ICD-10-CM | POA: Insufficient documentation

## 2012-08-02 DIAGNOSIS — R059 Cough, unspecified: Secondary | ICD-10-CM | POA: Insufficient documentation

## 2012-08-02 DIAGNOSIS — J40 Bronchitis, not specified as acute or chronic: Secondary | ICD-10-CM | POA: Insufficient documentation

## 2012-08-02 DIAGNOSIS — M545 Low back pain, unspecified: Secondary | ICD-10-CM | POA: Insufficient documentation

## 2012-08-02 DIAGNOSIS — R05 Cough: Secondary | ICD-10-CM

## 2012-08-02 DIAGNOSIS — F319 Bipolar disorder, unspecified: Secondary | ICD-10-CM | POA: Insufficient documentation

## 2012-08-02 DIAGNOSIS — R350 Frequency of micturition: Secondary | ICD-10-CM | POA: Insufficient documentation

## 2012-08-02 DIAGNOSIS — J45909 Unspecified asthma, uncomplicated: Secondary | ICD-10-CM | POA: Insufficient documentation

## 2012-08-02 DIAGNOSIS — J209 Acute bronchitis, unspecified: Secondary | ICD-10-CM

## 2012-08-02 LAB — URINALYSIS, ROUTINE W REFLEX MICROSCOPIC
Bilirubin Urine: NEGATIVE
Glucose, UA: NEGATIVE mg/dL
Leukocytes, UA: NEGATIVE
Nitrite: NEGATIVE
Specific Gravity, Urine: 1.01 (ref 1.005–1.030)
pH: 6 (ref 5.0–8.0)

## 2012-08-02 MED ORDER — ALBUTEROL SULFATE (5 MG/ML) 0.5% IN NEBU
INHALATION_SOLUTION | RESPIRATORY_TRACT | Status: AC
  Filled 2012-08-02: qty 0.5

## 2012-08-02 MED ORDER — HYDROCODONE-ACETAMINOPHEN 5-325 MG PO TABS: 1.0000 | ORAL_TABLET | ORAL | Status: AC | PRN

## 2012-08-02 MED ORDER — HYDROCODONE-ACETAMINOPHEN 5-325 MG PO TABS
1.0000 | ORAL_TABLET | Freq: Once | ORAL | Status: AC
  Administered 2012-08-02: 1 via ORAL
  Filled 2012-08-02: qty 1

## 2012-08-02 MED ORDER — ALBUTEROL SULFATE (5 MG/ML) 0.5% IN NEBU
5.0000 mg | INHALATION_SOLUTION | Freq: Once | RESPIRATORY_TRACT | Status: AC
  Administered 2012-08-02: 5 mg via RESPIRATORY_TRACT
  Filled 2012-08-02 (×2): qty 1

## 2012-08-02 MED ORDER — IPRATROPIUM BROMIDE 0.02 % IN SOLN
RESPIRATORY_TRACT | Status: AC
  Filled 2012-08-02: qty 2.5

## 2012-08-02 MED ORDER — IPRATROPIUM BROMIDE 0.02 % IN SOLN
0.5000 mg | Freq: Once | RESPIRATORY_TRACT | Status: AC
  Administered 2012-08-02: 0.5 mg via RESPIRATORY_TRACT
  Filled 2012-08-02 (×2): qty 2.5

## 2012-08-02 NOTE — ED Provider Notes (Signed)
Medical screening examination/treatment/procedure(s) were performed by non-physician practitioner and as supervising physician I was immediately available for consultation/collaboration. Devoria Albe, MD, Armando Gang   Ward Givens, MD 08/02/12 2329

## 2012-08-02 NOTE — ED Provider Notes (Signed)
History     CSN: [id number]  Arrival date & time 08/02/12  1703   First MD Initiated Contact with Patient 08/02/12 1747      Chief Complaint  Patient presents with  . Cough    (Consider location/radiation/quality/duration/timing/severity/associated sxs/prior treatment) HPI Comments: [name] is a 32 y.o. Female presenting with cough productive of yellow to green sputum,  Fever to 102, intermittent wheezing, nasal congestion with thick nasal discharge for the past 3-4 days.  Additionally,  States that she has had increased urinary frequency along with right lower back aching.  She does have a history significant for kidney stones,  Having passed a 2 mm stone about 6 months ago after being seen at an ed in [place].  She denies history of hematuria,  Denies pelvic or suprapubic pain.  She also describes history of frequent UTI's.  Pain is intermittent,  And has been present for the past 2 weeks.  She has taken no medications prior to arrival,  But has borrowed an albuterol mdi with some relief.  She denies weakness,  Chest pain, abdominal pain, dizziness.     The history is provided by the patient.    Past Medical History  Diagnosis Date  . Seizures   . Bipolar 1 disorder   . Asthma   . Substance abuse   . Chronic back pain   . Sciatica     Past Surgical History  Procedure Date  . Cholecystectomy   . Abdominal hysterectomy   . Abdominal surgery     History reviewed. No pertinent family history.  History  Substance Use Topics  . Smoking status: Current Every Day Smoker -- 1.0 packs/day    Types: Cigarettes  . Smokeless tobacco: Not on file  . Alcohol Use: 1.2 oz/week    2 Cans of beer per week    OB History    Grav Para Term Preterm Abortions TAB SAB Ect Mult Living                  Review of Systems  Constitutional: Positive for fever and chills.  HENT: Positive for congestion and rhinorrhea. Negative for sore throat and neck pain.   Eyes: Negative.    Respiratory: Positive for cough, shortness of breath and wheezing. Negative for chest tightness.   Cardiovascular: Negative for chest pain.  Gastrointestinal: Negative for nausea and abdominal pain.  Genitourinary: Positive for frequency and flank pain.  Musculoskeletal: Negative for joint swelling and arthralgias.  Skin: Negative.  Negative for rash and wound.  Neurological: Negative for dizziness, weakness, light-headedness, numbness and headaches.  Hematological: Negative.   Psychiatric/Behavioral: Negative.     Allergies  Metronidazole; Penicillins; Propoxyphene-acetaminophen; and Rocephin  Home Medications   Current Outpatient Rx  Name  Route  Sig  Dispense  Refill  . ALBUTEROL SULFATE HFA 108 (90 BASE) MCG/ACT IN AERS   Inhalation   Inhale 2 puffs into the lungs every 6 (six) hours as needed. Shortness of breath         . CARBAMAZEPINE 100 MG PO CHEW   Oral   Chew 150 mg by mouth 3 (three) times daily.           Marland Kitchen CLINDAMYCIN HCL 150 MG PO CAPS   Oral   Take 1 capsule (150 mg total) by mouth every 6 (six) hours.   28 capsule   0   . CLINDAMYCIN HCL 300 MG PO CAPS   Oral   Take 600 mg by mouth  3 (three) times daily. For 10 days. Started 05/28/12         . GABAPENTIN 300 MG PO CAPS   Oral   Take 300 mg by mouth 3 (three) times daily.           Marland Kitchen HYDROCODONE-ACETAMINOPHEN 5-325 MG PO TABS   Oral   Take 2 tablets by mouth every 4 (four) hours as needed for pain.   6 tablet   0   . HYDROCODONE-ACETAMINOPHEN 5-325 MG PO TABS   Oral   Take 1 tablet by mouth every 4 (four) hours as needed for pain (and for cough suppression).   10 tablet   0   . ONDANSETRON HCL 4 MG PO TABS   Oral   Take 1 tablet (4 mg total) by mouth every 6 (six) hours.   12 tablet   0     BP 121/59  Pulse 83  Temp 98.4 F (36.9 C) (Oral)  Resp 20  Ht 5\' 8"  (1.727 m)  Wt 140 lb (63.504 kg)  BMI 21.29 kg/m2  SpO2 98%  Physical Exam  Constitutional: She is oriented to  person, place, and time. She appears well-developed and well-nourished.  HENT:  Head: Normocephalic and atraumatic.  Right Ear: Tympanic membrane and ear canal normal.  Left Ear: Tympanic membrane and ear canal normal.  Nose: Mucosal edema and rhinorrhea present.  Mouth/Throat: Uvula is midline, oropharynx is clear and moist and mucous membranes are normal. No oropharyngeal exudate, posterior oropharyngeal edema, posterior oropharyngeal erythema or tonsillar abscesses.  Eyes: Conjunctivae normal are normal.  Neck: Normal range of motion.  Cardiovascular: Normal rate and normal heart sounds.   Pulmonary/Chest: Effort normal. No respiratory distress. She has wheezes. She has no rales. She exhibits no tenderness.  Abdominal: Soft. She exhibits no distension. There is no tenderness. There is no rebound and no guarding.  Musculoskeletal: Normal range of motion. She exhibits no edema.  Lymphadenopathy:    She has no cervical adenopathy.  Neurological: She is alert and oriented to person, place, and time.  Skin: Skin is warm and dry. No rash noted.  Psychiatric: She has a normal mood and affect.    ED Course  Procedures (including critical care time)   Labs Reviewed  URINALYSIS, ROUTINE W REFLEX MICROSCOPIC   Dg Chest 2 View  08/02/2012  *RADIOLOGY REPORT*  Clinical Data: Cough, history asthma, smoking  CHEST - 2 VIEW  Comparison: 04/18/2012  Findings: Normal heart size, mediastinal contours, and pulmonary vascularity. Minimal peribronchial thickening. No pulmonary filtrate, pleural effusion or pneumothorax. No acute osseous findings. Surgical clips right upper quadrant question cholecystectomy.  IMPRESSION: Peribronchial thickening which could reflect bronchitis or asthma. No acute infiltrate.   Original Report Authenticated By: Ulyses Southward, M.D.      1. Bronchitis with bronchospasm   2. Cough   3. Dysuria     Pt was given albuterol/atrovent neb with significant improvement in wheezing,  no respiratory distress, cough improved,  No sob at time of dc.  Also given hydrocodone tabs for cough suppression.   MDM  Patients labs and/or radiological studies were reviewed during the medical decision making and disposition process. Normal xray.  Pt with bronchitis with bronchospasm. VSS,  No dyspnea,  No hypoxia.  Pneumonia ruled out by xray.  Urinalysis negative for infection,  No hematuria.  Pt encouraged rest, increased fluids.  Return here for any worsened sx.  Smoking cessation.          Burgess Amor, PA  08/02/12 2119 

## 2012-08-02 NOTE — ED Notes (Signed)
Cough , back pain, yellow -green sputum. Fever 102 at home.  Nausea,

## 2012-08-07 ENCOUNTER — Emergency Department (HOSPITAL_COMMUNITY)
Admission: EM | Admit: 2012-08-07 | Discharge: 2012-08-07 | Disposition: A | Discharge status: Home / Self Care | Payer: Medicaid Other | Attending: Emergency Medicine | Admitting: Emergency Medicine

## 2012-08-07 DIAGNOSIS — J45909 Unspecified asthma, uncomplicated: Secondary | ICD-10-CM | POA: Insufficient documentation

## 2012-08-07 DIAGNOSIS — F319 Bipolar disorder, unspecified: Secondary | ICD-10-CM | POA: Insufficient documentation

## 2012-08-07 DIAGNOSIS — E876 Hypokalemia: Secondary | ICD-10-CM | POA: Insufficient documentation

## 2012-08-07 DIAGNOSIS — R112 Nausea with vomiting, unspecified: Secondary | ICD-10-CM | POA: Insufficient documentation

## 2012-08-07 DIAGNOSIS — F17200 Nicotine dependence, unspecified, uncomplicated: Secondary | ICD-10-CM | POA: Insufficient documentation

## 2012-08-07 DIAGNOSIS — F1911 Other psychoactive substance abuse, in remission: Secondary | ICD-10-CM

## 2012-08-07 DIAGNOSIS — Z720 Tobacco use: Secondary | ICD-10-CM

## 2012-08-07 DIAGNOSIS — R059 Cough, unspecified: Secondary | ICD-10-CM | POA: Insufficient documentation

## 2012-08-07 DIAGNOSIS — R05 Cough: Secondary | ICD-10-CM | POA: Insufficient documentation

## 2012-08-07 DIAGNOSIS — R509 Fever, unspecified: Secondary | ICD-10-CM | POA: Insufficient documentation

## 2012-08-07 DIAGNOSIS — R300 Dysuria: Secondary | ICD-10-CM | POA: Insufficient documentation

## 2012-08-07 DIAGNOSIS — Z79899 Other long term (current) drug therapy: Secondary | ICD-10-CM | POA: Insufficient documentation

## 2012-08-07 DIAGNOSIS — R109 Unspecified abdominal pain: Secondary | ICD-10-CM | POA: Insufficient documentation

## 2012-08-07 DIAGNOSIS — F1910 Other psychoactive substance abuse, uncomplicated: Secondary | ICD-10-CM | POA: Insufficient documentation

## 2012-08-07 LAB — CBC WITH DIFFERENTIAL/PLATELET
Lymphocytes Relative: 46 % (ref 12–46)
Lymphs Abs: 2.3 10*3/uL (ref 0.7–4.0)
Neutrophils Relative %: 47 % (ref 43–77)
Platelets: 271 10*3/uL (ref 150–400)
RBC: 3.88 MIL/uL (ref 3.87–5.11)
WBC: 5.1 10*3/uL (ref 4.0–10.5)

## 2012-08-07 LAB — URINALYSIS, ROUTINE W REFLEX MICROSCOPIC
Hgb urine dipstick: NEGATIVE
Nitrite: NEGATIVE
Specific Gravity, Urine: <1.005 — ABNORMAL LOW (ref 1.005–1.030)
Urobilinogen, UA: 0.2 mg/dL (ref 0.0–1.0)
pH: 6.5 (ref 5.0–8.0)

## 2012-08-07 LAB — BASIC METABOLIC PANEL
CO2: 18 mEq/L — ABNORMAL LOW (ref 19–32)
Chloride: 107 mEq/L (ref 96–112)
Glucose, Bld: 107 mg/dL — ABNORMAL HIGH (ref 70–99)
Potassium: 3 mEq/L — ABNORMAL LOW (ref 3.5–5.1)
Sodium: 141 mEq/L (ref 135–145)

## 2012-08-07 NOTE — ED Provider Notes (Signed)
History     CSN: [id number]  Arrival date & time 08/07/12  0345   None     No chief complaint on file.   (Consider location/radiation/quality/duration/timing/severity/associated sxs/prior treatment) HPI [name] is a 32 y.o. female who presents with multiple complaints, patient says she was seen here recently (08/02/2012) and diagnosed with bronchitis and cough, she says she also has a kidney pain" at that time on the right side. Patient says she's had a fever at home to 102, as right flank pain is sharp and stabbing, severe, associated with nausea and vomiting, diarrhea, cold sweats. Patient's had no blood in the stools, no chest pain, shortness of breath. Patient continues to have a cough and continues to smoke "half a pack a day".  Past Medical History  Diagnosis Date  . Seizures   . Bipolar 1 disorder   . Asthma   . Substance abuse   . Chronic back pain   . Sciatica     Past Surgical History  Procedure Laterality Date  . Cholecystectomy    . Abdominal hysterectomy    . Abdominal surgery      No family history on file.  History  Substance Use Topics  . Smoking status: Current Every Day Smoker -- 1.00 packs/day    Types: Cigarettes  . Smokeless tobacco: Not on file  . Alcohol Use: 1.2 oz/week    2 Cans of beer per week    OB History   Grav Para Term Preterm Abortions TAB SAB Ect Mult Living                  Review of Systems At least 10pt or greater review of systems completed and are negative except where specified in the HPI.  Allergies  Metronidazole; Penicillins; Propoxyphene-acetaminophen; and Rocephin  Home Medications   Current Outpatient Rx  Name  Route  Sig  Dispense  Refill  . albuterol (PROVENTIL HFA;VENTOLIN HFA) 108 (90 BASE) MCG/ACT inhaler   Inhalation   Inhale 2 puffs into the lungs every 6 (six) hours as needed. Shortness of breath         . carbamazepine (TEGRETOL) 100 MG chewable tablet   Oral   Chew 150 mg by mouth 3  (three) times daily.           . clindamycin (CLEOCIN) 150 MG capsule   Oral   Take 1 capsule (150 mg total) by mouth every 6 (six) hours.   28 capsule   0   . clindamycin (CLEOCIN) 300 MG capsule   Oral   Take 600 mg by mouth 3 (three) times daily. For 10 days. Started 05/28/12         . gabapentin (NEURONTIN) 300 MG capsule   Oral   Take 300 mg by mouth 3 (three) times daily.           Marland Kitchen HYDROcodone-acetaminophen (NORCO/VICODIN) 5-325 MG per tablet   Oral   Take 2 tablets by mouth every 4 (four) hours as needed for pain.   6 tablet   0   . HYDROcodone-acetaminophen (NORCO/VICODIN) 5-325 MG per tablet   Oral   Take 1 tablet by mouth every 4 (four) hours as needed for pain (and for cough suppression).   10 tablet   0   . ondansetron (ZOFRAN) 4 MG tablet   Oral   Take 1 tablet (4 mg total) by mouth every 6 (six) hours.   12 tablet   0     There  were no vitals taken for this visit.  Physical Exam  Nursing notes reviewed.  Electronic medical record reviewed. VITAL SIGNS:  There were no vitals filed for this visit.  CONSTITUTIONAL: Awake, oriented, appears non-toxic HENT: Atraumatic, normocephalic, oral mucosa pink and moist, airway patent. Nares patent without drainage. External ears normal. EYES: Conjunctiva clear, EOMI, PERRLA NECK: Trachea midline, non-tender, supple CARDIOVASCULAR: Normal heart rate, Normal rhythm, No murmurs, rubs, gallops PULMONARY/CHEST: Clear to auscultation, no rhonchi, wheezes, or rales. Symmetrical breath sounds. Non-tender. ABDOMINAL: Non-distended, soft, non-tender - no rebound or guarding.  BS normal. Patient jumps and screams in pain when I just touched the right flank, percussion does not elicit any pain when she is distracted. NEUROLOGIC: Non-focal, moving all four extremities, no gross sensory or motor deficits. EXTREMITIES: No clubbing, cyanosis, or edema SKIN: Warm, Dry, No erythema, No rash  ED Course  Procedures  (including critical care time)  Labs Reviewed  URINALYSIS, ROUTINE W REFLEX MICROSCOPIC - Abnormal; Notable for the following:    Specific Gravity, Urine <1.005 (*)    All other components within normal limits  BASIC METABOLIC PANEL - Abnormal; Notable for the following:    Potassium 3.0 (*)    CO2 18 (*)    Glucose, Bld 107 (*)    BUN 3 (*)    All other components within normal limits  CBC WITH DIFFERENTIAL   No results found.   1. Right flank pain   2. Dysuria   3. Tobacco abuse   4. H/O: substance abuse   5. Hypokalemia       MDM  [name] is a 32 y.o. female with a history of kidney stones and urinary tract infections presents with some right flank pain. I think this patient is malingering. I think this patient wants narcotic pain medicine. She continues to ask for Dilaudid by name.  Give the patient some Toradol as that is the first line agent for renal colic or kidney stones, also give the patient some Phenergan for nausea, a 1 L fluid bolus, obtain urine sample for UA and obtain blood samples for BMP/CBC. Patient has a benign abdominal exam. I do not think a liver function testing is indicated at this time. Patient's had a partial hysterectomy, we'll not obtain pregnancy test.  Labs are fairly unremarkable, potassium is mildly low at 3.0-but given the patient potassium replacement, she is normal renal function. Patient can just eat some food to maintain potassium levels in the future.  Throughout patient's ER stay she is continuously complained about needing pain medicine specifically Dilaudid, "when am I going to get my pain medicine" again I think this patient is seeking narcotic pain medicines. Patient does have an extensive history in the [facility] narcotic database, as well as a history of using heroin - I. do not think intravenous narcotics her this patient's best interest and I do not think narcotic pain medicine is medically indicated at this time.  I  have reviewed records of multiple ED visits with dental complaints or similar or other pain related complaints, usually with negative workups. I feel that the patient's pain is chronic and cannot appropriately or safely treated in an emergency department setting.  I do not feel that providing narcotic pain medication is in this patient's best interest. I have urged the patient to have close follow up with their provider or pain specialist. I have explicitly discussed with the patient return precautions and have reassured patient that they can always be seen  and evaluated in the emergency department for any condition that they feel is emergent, and that they will be given treatment as the EDP feels is appropriate and safe, but this may not involve the use of narcotic pain medications.  The patient was given the opportunity to voice any further questions or concerns and these were addressed to the best of my ability.           Jones Skene, MD 08/07/12 [phone]

## 2012-08-09 MED FILL — Ketorolac Tromethamine Inj 30 MG/ML: INTRAMUSCULAR | Qty: 1 | Status: AC

## 2012-08-09 MED FILL — Promethazine HCl Inj 25 MG/ML: INTRAMUSCULAR | Qty: 1 | Status: AC

## 2012-10-25 ENCOUNTER — Emergency Department (HOSPITAL_COMMUNITY): Payer: Medicaid Other

## 2012-10-25 ENCOUNTER — Encounter (HOSPITAL_COMMUNITY): Payer: Self-pay | Admitting: Adult Health

## 2012-10-25 ENCOUNTER — Emergency Department (HOSPITAL_COMMUNITY)
Admission: EM | Admit: 2012-10-25 | Discharge: 2012-10-25 | Disposition: A | Discharge status: Home / Self Care | Payer: Medicaid Other | Attending: Dermatology | Admitting: Dermatology

## 2012-10-25 DIAGNOSIS — R509 Fever, unspecified: Secondary | ICD-10-CM | POA: Insufficient documentation

## 2012-10-25 DIAGNOSIS — J209 Acute bronchitis, unspecified: Secondary | ICD-10-CM | POA: Insufficient documentation

## 2012-10-25 DIAGNOSIS — J40 Bronchitis, not specified as acute or chronic: Secondary | ICD-10-CM

## 2012-10-25 DIAGNOSIS — Z8739 Personal history of other diseases of the musculoskeletal system and connective tissue: Secondary | ICD-10-CM | POA: Insufficient documentation

## 2012-10-25 DIAGNOSIS — G40909 Epilepsy, unspecified, not intractable, without status epilepticus: Secondary | ICD-10-CM | POA: Insufficient documentation

## 2012-10-25 DIAGNOSIS — Z79899 Other long term (current) drug therapy: Secondary | ICD-10-CM | POA: Insufficient documentation

## 2012-10-25 DIAGNOSIS — J45909 Unspecified asthma, uncomplicated: Secondary | ICD-10-CM | POA: Insufficient documentation

## 2012-10-25 DIAGNOSIS — R071 Chest pain on breathing: Secondary | ICD-10-CM | POA: Insufficient documentation

## 2012-10-25 DIAGNOSIS — F17200 Nicotine dependence, unspecified, uncomplicated: Secondary | ICD-10-CM | POA: Insufficient documentation

## 2012-10-25 DIAGNOSIS — R110 Nausea: Secondary | ICD-10-CM | POA: Insufficient documentation

## 2012-10-25 DIAGNOSIS — G8929 Other chronic pain: Secondary | ICD-10-CM | POA: Insufficient documentation

## 2012-10-25 DIAGNOSIS — Z880 Allergy status to penicillin: Secondary | ICD-10-CM | POA: Insufficient documentation

## 2012-10-25 DIAGNOSIS — F319 Bipolar disorder, unspecified: Secondary | ICD-10-CM | POA: Insufficient documentation

## 2012-10-25 MED ORDER — ALBUTEROL SULFATE HFA 108 (90 BASE) MCG/ACT IN AERS: 1.0000 | INHALATION_SPRAY | RESPIRATORY_TRACT | Status: DC | PRN

## 2012-10-25 MED ORDER — PREDNISONE 50 MG PO TABS: ORAL_TABLET | ORAL | Status: DC

## 2012-10-25 MED ORDER — PROMETHAZINE HCL 25 MG PO TABS: 25.0000 mg | ORAL_TABLET | Freq: Four times a day (QID) | ORAL | Status: DC | PRN

## 2012-10-25 MED ORDER — HYDROCODONE-ACETAMINOPHEN 7.5-325 MG/15ML PO SOLN: 15.0000 mL | Freq: Three times a day (TID) | ORAL | Status: DC | PRN

## 2012-10-25 MED ORDER — AZITHROMYCIN 250 MG PO TABS: ORAL_TABLET | ORAL | Status: DC

## 2012-10-25 NOTE — ED Notes (Addendum)
Presents with 3 days of "croupy cough". C/o pain in chest with cough.  Reports fever that began yesterday, 102.1 at home. Afebrile here. Reports production from cough "sometimes" that is green and yellow phlegm.

## 2012-10-25 NOTE — ED Provider Notes (Signed)
History     CSN: [id number]  Arrival date & time 10/25/12  0053   First MD Initiated Contact with Patient 10/25/12 [phone]      Chief Complaint  Patient presents with  . Cough    (Consider location/radiation/quality/duration/timing/severity/associated sxs/prior treatment) HPI  [name] is a 32 y.o. female complaining of productive cough worsening over the course of 3 days with fever (Tmax of 1-2.0) nausea, and pleuritic chest pain.. Patient denies shortness of breath, emesis, change in bowel or bladder habits. She has reduced her smoking. She denies prior history of DVT, PE, leg swelling, recent immobilization or surgery.  Past Medical History  Diagnosis Date  . Seizures   . Bipolar 1 disorder   . Asthma   . Substance abuse   . Chronic back pain   . Sciatica     Past Surgical History  Procedure Laterality Date  . Cholecystectomy    . Abdominal hysterectomy    . Abdominal surgery      History reviewed. No pertinent family history.  History  Substance Use Topics  . Smoking status: Current Every Day Smoker -- 1.00 packs/day    Types: Cigarettes  . Smokeless tobacco: Not on file  . Alcohol Use: 1.2 oz/week    2 Cans of beer per week    OB History   Grav Para Term Preterm Abortions TAB SAB Ect Mult Living                  Review of Systems  Constitutional: Positive for fever.  Respiratory: Positive for cough. Negative for shortness of breath.   Cardiovascular: Positive for chest pain.  Gastrointestinal: Negative for nausea, vomiting, abdominal pain and diarrhea.  All other systems reviewed and are negative.    Allergies  Metronidazole; Penicillins; Propoxyphene-acetaminophen; and Rocephin  Home Medications   Current Outpatient Rx  Name  Route  Sig  Dispense  Refill  . albuterol (PROVENTIL HFA;VENTOLIN HFA) 108 (90 BASE) MCG/ACT inhaler   Inhalation   Inhale 2 puffs into the lungs every 6 (six) hours as needed. Shortness of breath         .  carbamazepine (TEGRETOL) 100 MG chewable tablet   Oral   Chew 150 mg by mouth 3 (three) times daily.           . diphenhydrAMINE (BENADRYL) 25 MG tablet   Oral   Take 25 mg by mouth every 6 (six) hours as needed for itching or allergies.         Marland Kitchen gabapentin (NEURONTIN) 300 MG capsule   Oral   Take 300 mg by mouth 3 (three) times daily.           Marland Kitchen HYDROcodone-acetaminophen (NORCO/VICODIN) 5-325 MG per tablet   Oral   Take 2 tablets by mouth every 4 (four) hours as needed for pain.   6 tablet   0   . ibuprofen (ADVIL,MOTRIN) 200 MG tablet   Oral   Take 600 mg by mouth every 6 (six) hours as needed for pain.           BP 124/88  Temp(Src) 98.5 F (36.9 C) (Oral)  Resp 24  SpO2 98%  Physical Exam  Nursing note and vitals reviewed. Constitutional: She is oriented to person, place, and time. She appears well-developed and well-nourished. No distress.  HENT:  Head: Normocephalic.  Mouth/Throat: Oropharynx is clear and moist.  Eyes: Conjunctivae and EOM are normal. Pupils are equal, round, and reactive to light.  Neck:  Normal range of motion.  Cardiovascular: Normal rate, regular rhythm and normal heart sounds.   Pulmonary/Chest: Effort normal. No stridor. No respiratory distress. She has wheezes. She has no rales. She exhibits no tenderness.  Expiratory wheezing  Abdominal: Soft. Bowel sounds are normal. There is no tenderness. There is no rebound and no guarding.  Musculoskeletal: Normal range of motion.  Neurological: She is alert and oriented to person, place, and time.  Psychiatric: She has a normal mood and affect.    ED Course  Procedures (including critical care time)  Labs Reviewed - No data to display Dg Chest 2 View  10/25/2012  *RADIOLOGY REPORT*  Clinical Data: Persistent, sometimes productive cough for 3 days.  CHEST - 2 VIEW  Comparison: 08/02/2012  Findings: The heart size and pulmonary vascularity are normal. The lungs appear clear and expanded  without focal air space disease or consolidation. No blunting of the costophrenic angles.  No pneumothorax.  Mediastinal contours appear intact.  Surgical clips in the right upper quadrant.  No acute changes since the previous study.  IMPRESSION: No evidence of active pulmonary disease   Original Report Authenticated By: Burman Nieves, M.D.      1. Bronchitis       MDM   [name] is a 32 y.o. female , fever and pleuritic chest pain worsening over the course of 2 days. chest x-ray shows no infiltrate. Patient has expiratory wheezing. I will treat her for bronchitis with steroid burst, azithromycin and also   Filed Vitals:   10/25/12 0108 10/25/12 0654  BP: 124/88 115/73  Pulse:  71  Temp: 98.5 F (36.9 C) 97 F (36.1 C)  TempSrc: Oral Oral  Resp: 24 20  SpO2: 98% 97%     Pt verbalized understanding and agrees with care plan. Outpatient follow-up and return precautions given.    Discharge Medication List as of 10/25/2012  6:46 AM    START taking these medications   Details  !! albuterol (PROVENTIL HFA;VENTOLIN HFA) 108 (90 BASE) MCG/ACT inhaler Inhale 1-2 puffs into the lungs every 4 (four) hours as needed for wheezing., Starting 10/25/2012, Until Discontinued, Print    azithromycin (ZITHROMAX Z-PAK) 250 MG tablet 2 po day one, then 1 daily x 4 days, Print    HYDROcodone-acetaminophen (HYCET) 7.5-325 mg/15 ml solution Take 15 mLs by mouth every 8 (eight) hours as needed for pain., Starting 10/25/2012, Until Discontinued, Print    predniSONE (DELTASONE) 50 MG tablet Take 1 tablet daily by mouth, Print    promethazine (PHENERGAN) 25 MG tablet Take 1 tablet (25 mg total) by mouth every 6 (six) hours as needed for nausea., Starting 10/25/2012, Until Discontinued, Print     !! - Potential duplicate medications found. Please discuss with provider.            Wynetta Emery, PA-C 10/25/12 1555

## 2012-10-27 NOTE — ED Provider Notes (Signed)
Medical screening examination/treatment/procedure(s) were performed by non-physician practitioner and as supervising physician I was immediately available for consultation/collaboration.   Laiden Milles E Buck Mcaffee, MD 10/27/12 0733 

## 2012-11-23 ENCOUNTER — Observation Stay (HOSPITAL_COMMUNITY): Payer: Medicaid Other

## 2012-11-23 ENCOUNTER — Observation Stay (HOSPITAL_COMMUNITY)
Admission: EM | Admit: 2012-11-23 | Discharge: 2012-11-25 | Disposition: A | Discharge status: Home / Self Care | Payer: Medicaid Other | Attending: Family Medicine | Admitting: Family Medicine

## 2012-11-23 ENCOUNTER — Encounter (HOSPITAL_COMMUNITY): Payer: Self-pay

## 2012-11-23 DIAGNOSIS — E876 Hypokalemia: Secondary | ICD-10-CM

## 2012-11-23 DIAGNOSIS — D62 Acute posthemorrhagic anemia: Secondary | ICD-10-CM | POA: Diagnosis present

## 2012-11-23 DIAGNOSIS — G40909 Epilepsy, unspecified, not intractable, without status epilepticus: Secondary | ICD-10-CM | POA: Diagnosis present

## 2012-11-23 DIAGNOSIS — J45909 Unspecified asthma, uncomplicated: Secondary | ICD-10-CM | POA: Insufficient documentation

## 2012-11-23 DIAGNOSIS — K921 Melena: Secondary | ICD-10-CM

## 2012-11-23 DIAGNOSIS — R1031 Right lower quadrant pain: Secondary | ICD-10-CM

## 2012-11-23 DIAGNOSIS — D649 Anemia, unspecified: Secondary | ICD-10-CM

## 2012-11-23 DIAGNOSIS — Z01812 Encounter for preprocedural laboratory examination: Secondary | ICD-10-CM | POA: Insufficient documentation

## 2012-11-23 DIAGNOSIS — R109 Unspecified abdominal pain: Secondary | ICD-10-CM

## 2012-11-23 DIAGNOSIS — K625 Hemorrhage of anus and rectum: Secondary | ICD-10-CM

## 2012-11-23 DIAGNOSIS — K644 Residual hemorrhoidal skin tags: Principal | ICD-10-CM | POA: Insufficient documentation

## 2012-11-23 HISTORY — DX: Diverticulitis of intestine, part unspecified, without perforation or abscess without bleeding: K57.92

## 2012-11-23 LAB — COMPREHENSIVE METABOLIC PANEL
ALT: 9 U/L (ref 0–35)
AST: 14 U/L (ref 0–37)
Alkaline Phosphatase: 81 U/L (ref 39–117)
CO2: 25 mEq/L (ref 19–32)
Chloride: 106 mEq/L (ref 96–112)
Creatinine, Ser: 0.68 mg/dL (ref 0.50–1.10)
GFR calc non Af Amer: >90 mL/min (ref >90)
Potassium: 3.2 mEq/L — ABNORMAL LOW (ref 3.5–5.1)
Sodium: 139 mEq/L (ref 135–145)
Total Bilirubin: 0.2 mg/dL — ABNORMAL LOW (ref 0.3–1.2)

## 2012-11-23 LAB — URINALYSIS, ROUTINE W REFLEX MICROSCOPIC
Glucose, UA: NEGATIVE mg/dL
Leukocytes, UA: NEGATIVE
Protein, ur: NEGATIVE mg/dL
pH: 6 (ref 5.0–8.0)

## 2012-11-23 LAB — CBC WITH DIFFERENTIAL/PLATELET
Basophils Absolute: 0.1 10*3/uL (ref 0.0–0.1)
HCT: 33.3 % — ABNORMAL LOW (ref 36.0–46.0)
Lymphocytes Relative: 40 % (ref 12–46)
Monocytes Absolute: 0.3 10*3/uL (ref 0.1–1.0)
Neutro Abs: 3.8 10*3/uL (ref 1.7–7.7)
Neutrophils Relative %: 54 % (ref 43–77)
RDW: 13.4 % (ref 11.5–15.5)
WBC: 7.1 10*3/uL (ref 4.0–10.5)

## 2012-11-23 MED ORDER — SODIUM CHLORIDE 0.9 % IV SOLN: INTRAVENOUS | Status: DC

## 2012-11-23 MED ORDER — SODIUM CHLORIDE 0.9 % IV SOLN
Freq: Once | INTRAVENOUS | Status: AC
  Administered 2012-11-23: 22:00:00 via INTRAVENOUS

## 2012-11-23 MED ORDER — IOHEXOL 300 MG/ML  SOLN
100.0000 mL | Freq: Once | INTRAMUSCULAR | Status: AC | PRN
  Administered 2012-11-23: 100 mL via INTRAVENOUS

## 2012-11-23 MED ORDER — HYDROMORPHONE HCL PF 1 MG/ML IJ SOLN
1.0000 mg | Freq: Once | INTRAMUSCULAR | Status: AC
  Administered 2012-11-23: 1 mg via INTRAVENOUS
  Filled 2012-11-23: qty 1

## 2012-11-23 MED ORDER — ONDANSETRON HCL 4 MG/2ML IJ SOLN
4.0000 mg | Freq: Once | INTRAMUSCULAR | Status: AC
  Administered 2012-11-23: 4 mg via INTRAVENOUS
  Filled 2012-11-23: qty 2

## 2012-11-23 MED ORDER — POTASSIUM CHLORIDE CRYS ER 20 MEQ PO TBCR
40.0000 meq | EXTENDED_RELEASE_TABLET | Freq: Once | ORAL | Status: AC
  Administered 2012-11-23: 40 meq via ORAL
  Filled 2012-11-23: qty 2

## 2012-11-23 MED ORDER — IOHEXOL 300 MG/ML  SOLN
50.0000 mL | Freq: Once | INTRAMUSCULAR | Status: AC | PRN
  Administered 2012-11-23: 50 mL via ORAL

## 2012-11-23 MED ORDER — SODIUM CHLORIDE 0.9 % IV BOLUS (SEPSIS)
1000.0000 mL | Freq: Once | INTRAVENOUS | Status: AC
  Administered 2012-11-23: 1000 mL via INTRAVENOUS

## 2012-11-23 MED ORDER — ONDANSETRON HCL 4 MG/2ML IJ SOLN: 4.0000 mg | Freq: Three times a day (TID) | INTRAMUSCULAR | Status: DC | PRN

## 2012-11-23 MED ORDER — SODIUM CHLORIDE 0.9 % IV SOLN: Freq: Once | INTRAVENOUS | Status: DC

## 2012-11-23 MED ORDER — HYDROMORPHONE HCL PF 1 MG/ML IJ SOLN
1.0000 mg | INTRAMUSCULAR | Status: DC | PRN
  Administered 2012-11-24: 1 mg via INTRAVENOUS
  Filled 2012-11-23 (×2): qty 1

## 2012-11-23 NOTE — H&P (Signed)
[facility] History and Physical  [name]:[id number] DOB: 20-Feb-1981 DOA: 11/23/2012   PCP: She goes to the [facility] health Department Specialists: None  Chief Complaint: Blood per rectum, and abdominal pain  HPI: [name] is a 31 y.o. female with a past medical history of seizure disorder, and bipolar disorder, along with asthma, who was in her usual state of health about 3 days ago, when she started having rectal bleeding. She's had about 2-3 episodes in a daily basis. She tells me that she has had large quantities of blood in her commode. She has had fever and chills and tells me that she has had a temperature up to 103F 2 days ago. This is associated with a stabbing pain in the lower abdomen radiating to the right side. The pain is 9/10 in intensity. There is no aggravating or relieving factors. There is no precipitating factor. There has been nausea, but no vomiting. She has had some pressure-like sensation with urination. Denies any blood in the urine. She had similar symptoms about a year ago, but has never seen a gastroenterologist. She's requesting pain medications. She's concerned about appendicitis. ED physician did a rectal exam and did not see any blood in the rectal vault.  Home Medications: Prior to Admission medications   Medication Sig Start Date End Date Taking? Authorizing Provider  albuterol (PROVENTIL HFA;VENTOLIN HFA) 108 (90 BASE) MCG/ACT inhaler Inhale 2 puffs into the lungs every 6 (six) hours as needed. Shortness of breath   Yes Historical Provider, MD  carbamazepine (TEGRETOL) 100 MG chewable tablet Chew 150 mg by mouth 3 (three) times daily.     Yes Historical Provider, MD  diphenhydrAMINE (BENADRYL) 25 MG tablet Take 25 mg by mouth every 6 (six) hours as needed for itching or allergies.   Yes Historical Provider, MD  gabapentin (NEURONTIN) 300 MG capsule Take 300 mg by mouth 3 (three) times daily.     Yes Historical Provider, MD   HYDROcodone-acetaminophen (NORCO/VICODIN) 5-325 MG per tablet Take 2 tablets by mouth every 4 (four) hours as needed for pain. 05/29/12  Yes Vida Roller, MD  ibuprofen (ADVIL,MOTRIN) 200 MG tablet Take 600 mg by mouth every 6 (six) hours as needed for pain.   Yes Historical Provider, MD    Allergies:  Allergies  Allergen Reactions  . Metronidazole Other (See Comments)    unknown  . Penicillins Other (See Comments)    Throat swell shut - states can take Amoxicillin safely  . Propoxyphene-Acetaminophen Other (See Comments)    unknown  . Rocephin (Ceftriaxone Sodium) Other (See Comments)    unknown    Past Medical History: Past Medical History  Diagnosis Date  . Seizures   . Bipolar 1 disorder   . Asthma   . Substance abuse   . Chronic back pain   . Sciatica   . Diverticulitis     Past Surgical History  Procedure Laterality Date  . Cholecystectomy    . Abdominal hysterectomy    . Abdominal surgery    . Dilation and curettage of uterus      Social History:  reports that she has been smoking Cigarettes.  She has been smoking about 1.00 pack per day. She does not have any smokeless tobacco history on file. She reports that  drinks alcohol. She reports that she does not use illicit drugs.  Living Situation: She lives with her mother Activity Level: Independent with daily activities   Family History: there is history  of, diabetes, cancer, heart disease in the family  Review of Systems - History obtained from the patient General ROS: positive for  - fatigue Psychological ROS: negative Ophthalmic ROS: negative ENT ROS: negative Allergy and Immunology ROS: negative Hematological and Lymphatic ROS: negative Endocrine ROS: negative Respiratory ROS: no cough, shortness of breath, or wheezing Cardiovascular ROS: no chest pain or dyspnea on exertion Gastrointestinal ROS: as in hpi Genito-Urinary ROS: as in hpi Musculoskeletal ROS: negative Neurological ROS: no TIA or  stroke symptoms Dermatological ROS: negative  Physical Examination  Filed Vitals:   11/23/12 1907 11/23/12 1910 11/23/12 1927 11/23/12 2220  BP: 141/86 145/101  135/83  Pulse: 108 100  88  Temp:    98.3 F (36.8 C)  TempSrc:    Oral  Resp:    18  Height:      Weight:      SpO2:   98% 97%    General appearance: alert, cooperative and no distress Head: Normocephalic, without obvious abnormality, atraumatic Eyes: conjunctivae/corneas clear. PERRL, EOM's intact.  Throat: lips, mucosa, and tongue normal; teeth and gums normal Neck: no adenopathy, no carotid bruit, no JVD, supple, symmetrical, trachea midline and thyroid not enlarged, symmetric, no tenderness/mass/nodules Resp: clear to auscultation bilaterally Cardio: regular rate and rhythm, S1, S2 normal, no murmur, click, rub or gallop GI: Abdomen was soft. There was tenderness over the suprapubic area, as well as in the right lower quadrant. There was no rebound, rigidity, or guarding. No masses, organomegaly. Bowel sounds are present. Extremities: extremities normal, atraumatic, no cyanosis or edema Pulses: 2+ and symmetric Skin: Skin color, texture, turgor normal. No rashes or lesions Lymph nodes: Cervical, supraclavicular, and axillary nodes normal. Neurologic: She is alert and oriented x3. No focal neurological deficits are present.  Laboratory Data: Results for orders placed during the hospital encounter of 11/23/12 (from the past 48 hour(s))  URINALYSIS, ROUTINE W REFLEX MICROSCOPIC     Status: Abnormal   Collection Time    11/23/12  6:12 PM      Result Value Range   Color, Urine YELLOW  YELLOW   APPearance CLEAR  CLEAR   Specific Gravity, Urine <1.005 (*) 1.005 - 1.030   pH 6.0  5.0 - 8.0   Glucose, UA NEGATIVE  NEGATIVE mg/dL   Hgb urine dipstick NEGATIVE  NEGATIVE   Bilirubin Urine NEGATIVE  NEGATIVE   Ketones, ur NEGATIVE  NEGATIVE mg/dL   Protein, ur NEGATIVE  NEGATIVE mg/dL   Urobilinogen, UA 0.2  0.0 - 1.0  mg/dL   Nitrite NEGATIVE  NEGATIVE   Leukocytes, UA NEGATIVE  NEGATIVE   Comment: MICROSCOPIC NOT DONE ON URINES WITH NEGATIVE PROTEIN, BLOOD, LEUKOCYTES, NITRITE, OR GLUCOSE <1000 mg/dL.  CBC WITH DIFFERENTIAL     Status: Abnormal   Collection Time    11/23/12  7:37 PM      Result Value Range   WBC 7.1  4.0 - 10.5 K/uL   RBC 3.56 (*) 3.87 - 5.11 MIL/uL   Hemoglobin 11.1 (*) 12.0 - 15.0 g/dL   HCT 40.9 (*) 81.1 - 91.4 %   MCV 93.5  78.0 - 100.0 fL   MCH 31.2  26.0 - 34.0 pg   MCHC 33.3  30.0 - 36.0 g/dL   RDW 78.2  95.6 - 21.3 %   Platelets 237  150 - 400 K/uL   Neutrophils Relative % 54  43 - 77 %   Neutro Abs 3.8  1.7 - 7.7 K/uL   Lymphocytes Relative 40  12 - 46 %   Lymphs Abs 2.8  0.7 - 4.0 K/uL   Monocytes Relative 4  3 - 12 %   Monocytes Absolute 0.3  0.1 - 1.0 K/uL   Eosinophils Relative 2  0 - 5 %   Eosinophils Absolute 0.1  0.0 - 0.7 K/uL   Basophils Relative 1  0 - 1 %   Basophils Absolute 0.1  0.0 - 0.1 K/uL  COMPREHENSIVE METABOLIC PANEL     Status: Abnormal   Collection Time    11/23/12  7:37 PM      Result Value Range   Sodium 139  135 - 145 mEq/L   Potassium 3.2 (*) 3.5 - 5.1 mEq/L   Chloride 106  96 - 112 mEq/L   CO2 25  19 - 32 mEq/L   Glucose, Bld 76  70 - 99 mg/dL   BUN 4 (*) 6 - 23 mg/dL   Creatinine, Ser 1.61  0.50 - 1.10 mg/dL   Calcium 8.6  8.4 - 09.6 mg/dL   Total Protein 6.0  6.0 - 8.3 g/dL   Albumin 3.1 (*) 3.5 - 5.2 g/dL   AST 14  0 - 37 U/L   ALT 9  0 - 35 U/L   Alkaline Phosphatase 81  39 - 117 U/L   Total Bilirubin 0.2 (*) 0.3 - 1.2 mg/dL   GFR calc non Af Amer >90  >90 mL/min   GFR calc Af Amer >90  >90 mL/min   Comment:            The eGFR has been calculated     using the CKD EPI equation.     This calculation has not been     validated in all clinical     situations.     eGFR's persistently     <90 mL/min signify     possible Chronic Kidney Disease.  LIPASE, BLOOD     Status: None   Collection Time    11/23/12  7:37 PM       Result Value Range   Lipase 17  11 - 59 U/L    Radiology Reports: No results found.   Problem List  Principal Problem:   Hematochezia Active Problems:   Right lower quadrant abdominal pain   Seizure disorder   Hypokalemia   Anemia due to blood loss, acute   Assessment: This is a 32 year old, Caucasian female, who presents with rectal bleeding and lower abdominal pain. She had a small amount of blood on the tissue here. No significant bleeding has been noted in the ED. Differential diagnoses include hemorrhoidal bleeding, colitis and diverticulosis. Abdominal pain is concerning considering history of fever. Although she is afebrile here  Plan: #1 hematochezia: She'll be monitored overnight. CBC will be checked frequently. Since this is her second episode of rectal bleeding we will go ahead and consult gastroenterology. PT, PTT will be checked.  #2 lower abdominal pain: Due to the history of fever we will proceed with a CT scan. Her last CT was in 2013, which did not show any acute findings. Lipase is normal. UA did not show any infection either.  #3 anemia likely due to acute blood loss. Hemoglobin is still quite reasonable. Does not require any transfusions. Monitor CBCs closely.  #4 seizure disorder: Continue with her antiepileptic drugs.  #5 hypokalemia: This will be repleted intravenously. Magnesium level will be checked.  #6 wheezing: She does have a history of asthma. We'll give her nebulizer treatments. Chest  x-ray will be checked.  #7 history of bipolar disorder: Appears to be stable  DVT Prophylaxis: SCDs Code Status: Full code Family Communication: Discussed with the patient  Disposition Plan: Observe   Further management decisions will depend on results of further testing and patient's response to treatment.  [facility]  [facility] Pager [phone]  If 7PM-7AM, please contact night-coverage [email or web address] Password [facility]  11/23/2012, 11:50 PM

## 2012-11-23 NOTE — ED Notes (Signed)
Dr. Preston Fleeting at bedside,

## 2012-11-23 NOTE — ED Notes (Signed)
Nurse unavailable to take report at this time.  

## 2012-11-23 NOTE — ED Provider Notes (Signed)
History     CSN: [id number]  Arrival date & time 11/23/12  1724   First MD Initiated Contact with Patient 11/23/12 1818      Chief Complaint  Patient presents with  . Abdominal Pain  . Rectal Bleeding    (Consider location/radiation/quality/duration/timing/severity/associated sxs/prior treatment) Patient is a 32 y.o. female presenting with abdominal pain and hematochezia. The history is provided by the patient.  Abdominal Pain Associated symptoms include abdominal pain.  Rectal Bleeding Associated symptoms: abdominal pain   She complains of severe pain across her lower abdomen which radiates up toward her right flank. This been present for 3-4 days and getting worse. There is associated nausea but no vomiting. She's had chills but no sweats. She has also had rectal bleeding. Sometimes she bleeds with a bowel movement and sometimes just passes blood. She has passed clots. Blood is bright red but she states that she has not passed any blood since last night. Denies feeling dizzy or lightheaded. She denies ever having had pain like this before. Nothing makes her pain better nothing makes it worse. She rates it at 9/10. Bleeding seems to be worse if she is active and better if she lays still.  Past Medical History  Diagnosis Date  . Seizures   . Bipolar 1 disorder   . Asthma   . Substance abuse   . Chronic back pain   . Sciatica   . Diverticulitis     Past Surgical History  Procedure Laterality Date  . Cholecystectomy    . Abdominal hysterectomy    . Abdominal surgery    . Dilation and curettage of uterus      No family history on file.  History  Substance Use Topics  . Smoking status: Current Every Day Smoker -- 1.00 packs/day    Types: Cigarettes  . Smokeless tobacco: Not on file  . Alcohol Use: 0.0 oz/week     Comment: occ    OB History   Grav Para Term Preterm Abortions TAB SAB Ect Mult Living                  Review of Systems  Gastrointestinal: Positive  for abdominal pain and hematochezia.  All other systems reviewed and are negative.    Allergies  Metronidazole; Penicillins; Propoxyphene-acetaminophen; and Rocephin  Home Medications   Current Outpatient Rx  Name  Route  Sig  Dispense  Refill  . albuterol (PROVENTIL HFA;VENTOLIN HFA) 108 (90 BASE) MCG/ACT inhaler   Inhalation   Inhale 2 puffs into the lungs every 6 (six) hours as needed. Shortness of breath         . albuterol (PROVENTIL HFA;VENTOLIN HFA) 108 (90 BASE) MCG/ACT inhaler   Inhalation   Inhale 1-2 puffs into the lungs every 4 (four) hours as needed for wheezing.   1 Inhaler   3   . azithromycin (ZITHROMAX Z-PAK) 250 MG tablet      2 po day one, then 1 daily x 4 days   5 tablet   0   . carbamazepine (TEGRETOL) 100 MG chewable tablet   Oral   Chew 150 mg by mouth 3 (three) times daily.           . diphenhydrAMINE (BENADRYL) 25 MG tablet   Oral   Take 25 mg by mouth every 6 (six) hours as needed for itching or allergies.         Marland Kitchen gabapentin (NEURONTIN) 300 MG capsule   Oral   Take 300  mg by mouth 3 (three) times daily.           Marland Kitchen HYDROcodone-acetaminophen (HYCET) 7.5-325 mg/15 ml solution   Oral   Take 15 mLs by mouth every 8 (eight) hours as needed for pain.   120 mL   0   . HYDROcodone-acetaminophen (NORCO/VICODIN) 5-325 MG per tablet   Oral   Take 2 tablets by mouth every 4 (four) hours as needed for pain.   6 tablet   0   . ibuprofen (ADVIL,MOTRIN) 200 MG tablet   Oral   Take 600 mg by mouth every 6 (six) hours as needed for pain.         . predniSONE (DELTASONE) 50 MG tablet      Take 1 tablet daily by mouth   5 tablet   0   . promethazine (PHENERGAN) 25 MG tablet   Oral   Take 1 tablet (25 mg total) by mouth every 6 (six) hours as needed for nausea.   12 tablet   0     BP 112/77  Pulse 94  Temp(Src) 98.6 F (37 C) (Oral)  Resp 20  Ht 5\' 8"  (1.727 m)  Wt 145 lb (65.772 kg)  BMI 22.05 kg/m2  SpO2 99%  Physical  Exam  Nursing note and vitals reviewed.  32 year old female, who is crying in pain, but his in no acute distress. Vital signs are normal. Oxygen saturation is 99%, which is normal. Head is normocephalic and atraumatic. PERRLA, EOMI. Oropharynx is clear. Neck is nontender and supple without adenopathy or JVD. Back is nontender and there is no CVA tenderness. Lungs are clear without rales, wheezes, or rhonchi. Chest is nontender. Heart has regular rate and rhythm without murmur. Abdomen is soft, flat, with moderate tenderness across the lower abdomen without any localized tenderness. There is no rebound or guarding. There are no masses or hepatosplenomegaly and peristalsis is hypoactive. Rectal: 1 small external skin tag is noted, normal sphincter tone, no masses or internal hemorrhoids, brown stool which is Hemoccult negative with appropriate quality controls. Extremities have no cyanosis or edema, full range of motion is present. Skin is warm and dry without rash. Neurologic: Mental status is normal, cranial nerves are intact, there are no motor or sensory deficits.  ED Course  Procedures (including critical care time)  Results for orders placed during the hospital encounter of 11/23/12  URINALYSIS, ROUTINE W REFLEX MICROSCOPIC      Result Value Range   Color, Urine YELLOW  YELLOW   APPearance CLEAR  CLEAR   Specific Gravity, Urine <1.005 (*) 1.005 - 1.030   pH 6.0  5.0 - 8.0   Glucose, UA NEGATIVE  NEGATIVE mg/dL   Hgb urine dipstick NEGATIVE  NEGATIVE   Bilirubin Urine NEGATIVE  NEGATIVE   Ketones, ur NEGATIVE  NEGATIVE mg/dL   Protein, ur NEGATIVE  NEGATIVE mg/dL   Urobilinogen, UA 0.2  0.0 - 1.0 mg/dL   Nitrite NEGATIVE  NEGATIVE   Leukocytes, UA NEGATIVE  NEGATIVE  CBC WITH DIFFERENTIAL      Result Value Range   WBC 7.1  4.0 - 10.5 K/uL   RBC 3.56 (*) 3.87 - 5.11 MIL/uL   Hemoglobin 11.1 (*) 12.0 - 15.0 g/dL   HCT 16.1 (*) 09.6 - 04.5 %   MCV 93.5  78.0 - 100.0 fL   MCH  31.2  26.0 - 34.0 pg   MCHC 33.3  30.0 - 36.0 g/dL   RDW 40.9  81.1 -  15.5 %   Platelets 237  150 - 400 K/uL   Neutrophils Relative % 54  43 - 77 %   Neutro Abs 3.8  1.7 - 7.7 K/uL   Lymphocytes Relative 40  12 - 46 %   Lymphs Abs 2.8  0.7 - 4.0 K/uL   Monocytes Relative 4  3 - 12 %   Monocytes Absolute 0.3  0.1 - 1.0 K/uL   Eosinophils Relative 2  0 - 5 %   Eosinophils Absolute 0.1  0.0 - 0.7 K/uL   Basophils Relative 1  0 - 1 %   Basophils Absolute 0.1  0.0 - 0.1 K/uL  COMPREHENSIVE METABOLIC PANEL      Result Value Range   Sodium 139  135 - 145 mEq/L   Potassium 3.2 (*) 3.5 - 5.1 mEq/L   Chloride 106  96 - 112 mEq/L   CO2 25  19 - 32 mEq/L   Glucose, Bld 76  70 - 99 mg/dL   BUN 4 (*) 6 - 23 mg/dL   Creatinine, Ser 7.84  0.50 - 1.10 mg/dL   Calcium 8.6  8.4 - 69.6 mg/dL   Total Protein 6.0  6.0 - 8.3 g/dL   Albumin 3.1 (*) 3.5 - 5.2 g/dL   AST 14  0 - 37 U/L   ALT 9  0 - 35 U/L   Alkaline Phosphatase 81  39 - 117 U/L   Total Bilirubin 0.2 (*) 0.3 - 1.2 mg/dL   GFR calc non Af Amer >90  >90 mL/min   GFR calc Af Amer >90  >90 mL/min  LIPASE, BLOOD      Result Value Range   Lipase 17  11 - 59 U/L      1. Abdominal pain, lower, unspecified laterality   2. Rectal bleeding   3. Anemia   4. Hypokalemia       MDM  Abdominal pain of uncertain cause. Report of rectal bleeding which is suspect given negative Hemoccult. Old records are reviewed and she has been seen in the ED for rectal bleeding and abdominal pain but no episodes of abdominal pain which match her current pain and no bleeding like what she has had currently. She is sent for CT scans of her abdomen which have mostly been normal (1 had evidence of unilateral hydronephrosis consistent with recently passed kidney stone). Nursing history states she has a history of diverticulosis but no mention of diverticulosis was made in any of her CT scans. She will be given IV hydration and screening labs obtained as well as  orthostatic vital signs. I am not certain of the cause of her pain, but I doubt she has had significant bleeding.   Patient has required 2 doses of hydromorphone to get control of pain. She had a bowel movement in the ED which did have some blood on it when she wiped. Orthostatic vital signs show no significant change in pulse or blood pressure. However, hemoglobin has dropped significantly from last recorded hemoglobin. This is a 1.8 g drop over a span of about 3 months. There has been no change in MCV and this would indicate an acute blood loss rather than subacute or chronic blood loss. Therefore, I do not feel that she is stable to go home. Incidental finding of hypokalemia is noted and she is given a dose of oral potassium. Case is discussed with Dr. Rito Ehrlich of [facility] who agrees to admit her under observation status. Myoglobin can be rechecked  in the morning and GI consultation may be considered.     Dione Booze, MD 11/23/12 2205

## 2012-11-23 NOTE — ED Notes (Signed)
Called to patient's room, patient states that when she wiped she had a small amount of red blood on tissue. No active bleeding noted at this time.

## 2012-11-23 NOTE — ED Notes (Signed)
Patient would like something for pain. RN made aware. 

## 2012-11-23 NOTE — ED Notes (Signed)
Pt c/o lower abd pain and rectal bleeding x 3 days.  Reports history of diverticulosis.

## 2012-11-24 ENCOUNTER — Encounter (HOSPITAL_COMMUNITY)
Admission: EM | Disposition: A | Discharge status: Home / Self Care | Payer: Self-pay | Source: Home / Self Care | Attending: Emergency Medicine

## 2012-11-24 ENCOUNTER — Observation Stay (HOSPITAL_COMMUNITY): Payer: Medicaid Other

## 2012-11-24 ENCOUNTER — Encounter (HOSPITAL_COMMUNITY): Payer: Self-pay

## 2012-11-24 DIAGNOSIS — K625 Hemorrhage of anus and rectum: Secondary | ICD-10-CM

## 2012-11-24 DIAGNOSIS — K649 Unspecified hemorrhoids: Secondary | ICD-10-CM

## 2012-11-24 DIAGNOSIS — R109 Unspecified abdominal pain: Secondary | ICD-10-CM

## 2012-11-24 HISTORY — PX: COLONOSCOPY: SHX5424

## 2012-11-24 LAB — CBC
HCT: 34.2 % — ABNORMAL LOW (ref 36.0–46.0)
HCT: 37.4 % (ref 36.0–46.0)
Hemoglobin: 11.2 g/dL — ABNORMAL LOW (ref 12.0–15.0)
Hemoglobin: 11.4 g/dL — ABNORMAL LOW (ref 12.0–15.0)
Hemoglobin: 12.2 g/dL (ref 12.0–15.0)
MCH: 30.7 pg (ref 26.0–34.0)
MCH: 30.7 pg (ref 26.0–34.0)
MCH: 31 pg (ref 26.0–34.0)
MCH: 31.1 pg (ref 26.0–34.0)
MCHC: 32.6 g/dL (ref 30.0–36.0)
MCHC: 32.6 g/dL (ref 30.0–36.0)
MCHC: 32.5 g/dL (ref 30.0–36.0)
MCV: 93.4 fL (ref 78.0–100.0)
MCV: 94.2 fL (ref 78.0–100.0)
MCV: 94.5 fL (ref 78.0–100.0)
MCV: 95.2 fL (ref 78.0–100.0)
Platelets: 212 10*3/uL (ref 150–400)
RBC: 3.65 MIL/uL — ABNORMAL LOW (ref 3.87–5.11)
RBC: 3.66 MIL/uL — ABNORMAL LOW (ref 3.87–5.11)
RDW: 13.6 % (ref 11.5–15.5)

## 2012-11-24 LAB — MAGNESIUM: Magnesium: 1.9 mg/dL (ref 1.5–2.5)

## 2012-11-24 LAB — COMPREHENSIVE METABOLIC PANEL
ALT: 9 U/L (ref 0–35)
AST: 14 U/L (ref 0–37)
Albumin: 2.9 g/dL — ABNORMAL LOW (ref 3.5–5.2)
CO2: 24 mEq/L (ref 19–32)
Calcium: 8.1 mg/dL — ABNORMAL LOW (ref 8.4–10.5)
Chloride: 109 mEq/L (ref 96–112)
GFR calc non Af Amer: >90 mL/min (ref >90)
Sodium: 140 mEq/L (ref 135–145)
Total Bilirubin: 0.1 mg/dL — ABNORMAL LOW (ref 0.3–1.2)

## 2012-11-24 LAB — RAPID URINE DRUG SCREEN, HOSP PERFORMED
Barbiturates: NOT DETECTED
Opiates: NOT DETECTED
Tetrahydrocannabinol: NOT DETECTED

## 2012-11-24 LAB — FERRITIN: Ferritin: 33 ng/mL (ref 10–291)

## 2012-11-24 LAB — IRON AND TIBC
Iron: 41 ug/dL — ABNORMAL LOW (ref 42–135)
TIBC: 269 ug/dL (ref 250–470)
UIBC: 228 ug/dL (ref 125–400)

## 2012-11-24 LAB — PROTIME-INR: Prothrombin Time: 13.6 seconds (ref 11.6–15.2)

## 2012-11-24 SURGERY — COLONOSCOPY
Anesthesia: Moderate Sedation

## 2012-11-24 MED ORDER — HYDROMORPHONE HCL PF 1 MG/ML IJ SOLN
1.0000 mg | INTRAMUSCULAR | Status: DC | PRN
  Administered 2012-11-24 (×3): 1 mg via INTRAVENOUS
  Filled 2012-11-24 (×2): qty 1

## 2012-11-24 MED ORDER — HYDROCORTISONE ACETATE 25 MG RE SUPP
25.0000 mg | Freq: Two times a day (BID) | RECTAL | Status: DC
  Administered 2012-11-25: 25 mg via RECTAL
  Filled 2012-11-24 (×4): qty 1

## 2012-11-24 MED ORDER — PANTOPRAZOLE SODIUM 40 MG IV SOLR
40.0000 mg | INTRAVENOUS | Status: DC
  Administered 2012-11-24 – 2012-11-25 (×2): 40 mg via INTRAVENOUS
  Filled 2012-11-24 (×2): qty 40

## 2012-11-24 MED ORDER — MEPERIDINE HCL 50 MG/ML IJ SOLN
INTRAMUSCULAR | Status: DC | PRN
  Administered 2012-11-24 (×2): 25 mg via INTRAVENOUS

## 2012-11-24 MED ORDER — HYDROCODONE-ACETAMINOPHEN 5-325 MG PO TABS
2.0000 | ORAL_TABLET | ORAL | Status: DC | PRN
  Administered 2012-11-24 – 2012-11-25 (×6): 2 via ORAL
  Filled 2012-11-24 (×6): qty 2

## 2012-11-24 MED ORDER — DICYCLOMINE HCL 10 MG PO CAPS
10.0000 mg | ORAL_CAPSULE | Freq: Three times a day (TID) | ORAL | Status: DC
  Administered 2012-11-24 – 2012-11-25 (×2): 10 mg via ORAL
  Filled 2012-11-24 (×2): qty 1

## 2012-11-24 MED ORDER — PROMETHAZINE HCL 25 MG/ML IJ SOLN
INTRAMUSCULAR | Status: AC
  Filled 2012-11-24: qty 1

## 2012-11-24 MED ORDER — CARBAMAZEPINE 100 MG PO CHEW
CHEWABLE_TABLET | ORAL | Status: AC
  Filled 2012-11-24: qty 2

## 2012-11-24 MED ORDER — ALBUTEROL SULFATE (5 MG/ML) 0.5% IN NEBU
2.5000 mg | INHALATION_SOLUTION | Freq: Four times a day (QID) | RESPIRATORY_TRACT | Status: DC
  Administered 2012-11-24 (×2): 2.5 mg via RESPIRATORY_TRACT
  Filled 2012-11-24 (×2): qty 0.5

## 2012-11-24 MED ORDER — GABAPENTIN 300 MG PO CAPS: 300.0000 mg | ORAL_CAPSULE | Freq: Three times a day (TID) | ORAL | Status: DC

## 2012-11-24 MED ORDER — ALBUTEROL SULFATE (5 MG/ML) 0.5% IN NEBU: 2.5000 mg | INHALATION_SOLUTION | RESPIRATORY_TRACT | Status: DC | PRN

## 2012-11-24 MED ORDER — PROMETHAZINE HCL 25 MG/ML IJ SOLN
INTRAMUSCULAR | Status: DC | PRN
  Administered 2012-11-24: 12.5 mg via INTRAVENOUS

## 2012-11-24 MED ORDER — POTASSIUM CHLORIDE IN NACL 20-0.9 MEQ/L-% IV SOLN
INTRAVENOUS | Status: AC
  Administered 2012-11-24 (×2): via INTRAVENOUS

## 2012-11-24 MED ORDER — MEPERIDINE HCL 50 MG/ML IJ SOLN
INTRAMUSCULAR | Status: AC
  Filled 2012-11-24: qty 1

## 2012-11-24 MED ORDER — ONDANSETRON HCL 4 MG PO TABS: 4.0000 mg | ORAL_TABLET | Freq: Four times a day (QID) | ORAL | Status: DC | PRN

## 2012-11-24 MED ORDER — ALBUTEROL SULFATE HFA 108 (90 BASE) MCG/ACT IN AERS: 2.0000 | INHALATION_SPRAY | Freq: Four times a day (QID) | RESPIRATORY_TRACT | Status: DC | PRN

## 2012-11-24 MED ORDER — MIDAZOLAM HCL 5 MG/5ML IJ SOLN
INTRAMUSCULAR | Status: DC | PRN
  Administered 2012-11-24: 1 mg via INTRAVENOUS
  Administered 2012-11-24 (×4): 2 mg via INTRAVENOUS
  Administered 2012-11-24: 3 mg via INTRAVENOUS

## 2012-11-24 MED ORDER — ACETAMINOPHEN 325 MG PO TABS: 650.0000 mg | ORAL_TABLET | Freq: Four times a day (QID) | ORAL | Status: DC | PRN

## 2012-11-24 MED ORDER — SODIUM CHLORIDE 0.9 % IJ SOLN
INTRAMUSCULAR | Status: AC
  Filled 2012-11-24: qty 10

## 2012-11-24 MED ORDER — ALBUTEROL SULFATE HFA 108 (90 BASE) MCG/ACT IN AERS: 2.0000 | INHALATION_SPRAY | Freq: Two times a day (BID) | RESPIRATORY_TRACT | Status: DC

## 2012-11-24 MED ORDER — HYDROMORPHONE HCL PF 1 MG/ML IJ SOLN
1.0000 mg | INTRAMUSCULAR | Status: DC | PRN
  Administered 2012-11-24 – 2012-11-25 (×6): 1 mg via INTRAVENOUS
  Filled 2012-11-24 (×7): qty 1

## 2012-11-24 MED ORDER — PEG 3350-KCL-NABCB-NACL-NASULF 236 G PO SOLR
4000.0000 mL | Freq: Once | ORAL | Status: AC
  Administered 2012-11-24: 4000 mL via ORAL
  Filled 2012-11-24: qty 4000

## 2012-11-24 MED ORDER — ONDANSETRON HCL 4 MG/2ML IJ SOLN
4.0000 mg | Freq: Four times a day (QID) | INTRAMUSCULAR | Status: DC | PRN
  Administered 2012-11-24 – 2012-11-25 (×3): 4 mg via INTRAVENOUS
  Filled 2012-11-24 (×3): qty 2

## 2012-11-24 MED ORDER — CARBAMAZEPINE 100 MG PO CHEW
150.0000 mg | CHEWABLE_TABLET | Freq: Three times a day (TID) | ORAL | Status: DC
  Administered 2012-11-24 – 2012-11-25 (×4): 150 mg via ORAL
  Filled 2012-11-24 (×8): qty 1.5

## 2012-11-24 MED ORDER — NICOTINE 21 MG/24HR TD PT24
21.0000 mg | MEDICATED_PATCH | Freq: Every day | TRANSDERMAL | Status: DC
  Administered 2012-11-24 – 2012-11-25 (×2): 21 mg via TRANSDERMAL
  Filled 2012-11-24 (×2): qty 1

## 2012-11-24 MED ORDER — ACETAMINOPHEN 650 MG RE SUPP: 650.0000 mg | Freq: Four times a day (QID) | RECTAL | Status: DC | PRN

## 2012-11-24 MED ORDER — GABAPENTIN 300 MG PO CAPS
300.0000 mg | ORAL_CAPSULE | Freq: Three times a day (TID) | ORAL | Status: DC
  Administered 2012-11-24 – 2012-11-25 (×4): 300 mg via ORAL
  Filled 2012-11-24 (×4): qty 1

## 2012-11-24 MED ORDER — MIDAZOLAM HCL 5 MG/5ML IJ SOLN
INTRAMUSCULAR | Status: AC
  Filled 2012-11-24: qty 5

## 2012-11-24 MED ORDER — STERILE WATER FOR IRRIGATION IR SOLN
Status: DC | PRN
  Administered 2012-11-24: 16:00:00

## 2012-11-24 MED ORDER — ALBUTEROL SULFATE HFA 108 (90 BASE) MCG/ACT IN AERS
2.0000 | INHALATION_SPRAY | Freq: Three times a day (TID) | RESPIRATORY_TRACT | Status: DC
  Administered 2012-11-24 – 2012-11-25 (×2): 2 via RESPIRATORY_TRACT
  Filled 2012-11-24: qty 6.7

## 2012-11-24 MED ORDER — MIDAZOLAM HCL 5 MG/5ML IJ SOLN
INTRAMUSCULAR | Status: AC
  Filled 2012-11-24: qty 10

## 2012-11-24 MED ORDER — CARBAMAZEPINE 100 MG PO CHEW
150.0000 mg | CHEWABLE_TABLET | Freq: Three times a day (TID) | ORAL | Status: DC
  Filled 2012-11-24: qty 1.5

## 2012-11-24 NOTE — Op Note (Signed)
COLONOSCOPY PROCEDURE REPORT  PATIENT:  [name]  MR#:  [id number] Birthdate:  Sep 08, 1980, 32 y.o., female Endoscopist:  Dr. Malissa Hippo, MD Procedure Date: 11/24/2012  Procedure:   Colonoscopy  Indications: Patient is 32 year old Caucasian female who presents with four-day history of rectal bleeding. She also complains of lower abdominal pain. Her hemoglobin on admission was 11.1 g but was 12.2 g at noon today.  Informed Consent:  The procedure and risks were reviewed with the patient and informed consent was obtained.  Medications:  Demerol 50 mg IV Versed 12 mg IV Promethazine 12.5 mg IV and diluted form.  Description of procedure:  After a digital rectal exam was performed, that colonoscope was advanced from the anus through the rectum and colon to the area of the cecum, ileocecal valve and appendiceal orifice. The cecum was deeply intubated. These structures were well-seen and photographed for the record. From the level of the cecum and ileocecal valve, the scope was slowly and cautiously withdrawn. The mucosal surfaces were carefully surveyed utilizing scope tip to flexion to facilitate fold flattening as needed. The scope was pulled down into the rectum where a thorough exam including retroflexion was performed.  Findings:   Prep excellent. Normal mucosa of the colon and rectum. Hemorrhoids noted below the dentate line along with surface erosion and friability at dentate line.    Therapeutic/Diagnostic Maneuvers Performed:  None  Complications:  None  Cecal Withdrawal Time:  10 minutes  Impression:  Normal colonoscopy except external hemorrhoids and surface erosion the dentate line felt to be source of rectal bleeding. She possibly has IBS as well.  Recommendations:  Advance diet. Anusol-HC Suppository one per rectum twice daily for two-week. Dicyclomine 10 mg by mouth a.c.   Shawnette Augello U  11/24/2012 4:48 PM  CC: Dr. Bonnetta Barry PCP Per Patient & Dr. No ref.  provider found

## 2012-11-24 NOTE — Progress Notes (Signed)
[facility] PROGRESS NOTE  [name] ZOX:[id number] DOB: 1981/04/12 DOA: 11/23/2012 PCP: No PCP Per Patient [facility] health Department  Assessment/Plan: 1. Rectal bleeding, hematochezia: Differential includes hemorrhoidal bleeding,diverticular bleeding. CT of the abdomen and pelvis unremarkable. GI consultation pending. History of same 2009, EGD and colonoscopy was planned at that time after resolution of his cellulitis. 2. Lower and Right lower quadrant pain with reported fever prior to admission: No fever reported. CT scan negative. Lipase normal. Urinalysis negative. Benign exam. Etiology and significance unclear. Monitor clinically. 3. Acute blood loss anemia and possible: Follow CBC. Hemoglobin stable as far. 4. History of seizure disorder: Stable. Continue Tegretol. Continue Neurontin. 5. Bipolar disorder: 6. Asthma: Quiescent. 7. History of Substance abuse: Per chart review history of cocaine skin popping and IV use. Urine drug screen negative this admission. 8. Chronic back pain, sciatica:  9. History of nephrolithiasis: Kidneys appeared normal CT. Monitor. 10. Status post vaginal hysterectomy, left salpingo-oophorectomy, laparoscopic tubal sterilization   Serial CBC, followup GI consultation   serial abdominal exams  Code Status: Full code  DVT prophylaxis: SCDs  Family Communication: None present  Disposition Plan: When improved 1-2 days.  Brendia Sacks, MD  [facility]  Pager [phone] If 7PM-7AM, please contact night-coverage at [email or web address], password [email or web address] 11/24/2012, 8:44 AM  LOS: 1 day   Clinical Summary: 32 y.o. female with a past medical history of seizure disorder, and bipolar disorder, along with asthma, who was in her usual state of health about 3 days ago, when she started having rectal bleeding. She's had about 2-3 episodes in a daily basis. She tells me that she has had large quantities of blood in her commode. She has had fever  and chills and tells me that she has had a temperature up to 103F 2 days ago. This is associated with a stabbing pain in the lower abdomen radiating to the right side. The pain is 9/10 in intensity. There is no aggravating or relieving factors. There is no precipitating factor. There has been nausea, but no vomiting. She has had some pressure-like sensation with urination. Denies any blood in the urine. She had similar symptoms about a year ago, but has never seen a gastroenterologist. She's requesting pain medications. She's concerned about appendicitis. ED physician did a rectal exam and did not see any blood in the rectal vault.  Consultants:  Gastroenterology  Procedures:    Antibiotics:  None  HPI/Subjective: Afebrile, vital signs stable. She reports one episode of bleeding with bowel movements since admission. Continues to have lower and right lower quadrant abdominal pain. Nausea but no vomiting. Chronic back pain.  Objective: Filed Vitals:   11/23/12 1927 11/23/12 2220 11/24/12 0524 11/24/12 0705  BP:  135/83 95/60   Pulse:  88 69   Temp:  98.3 F (36.8 C) 98 F (36.7 C)   TempSrc:  Oral    Resp:  18 18   Height:      Weight:      SpO2: 98% 97% 97% 100%    Intake/Output Summary (Last 24 hours) at 11/24/12 0844 Last data filed at 11/24/12 0840  Gross per 24 hour  Intake    510 ml  Output      0 ml  Net    510 ml     Filed Weights   11/23/12 1741  Weight: 65.772 kg (145 lb)    Exam:  General:  Appears calm and comfortable, mildly anxious Cardiovascular: RRR, no m/r/g. No LE edema. Respiratory: CTA  bilaterally, no w/r/r. Normal respiratory effort. Abdomen: soft, ntnd, positive bowel sounds. No rebound or guarding. Benign examination. No pain with palpation with stethoscope. Skin: no rash or induration seen  Musculoskeletal: grossly normal tone BUE/BLE Psychiatric: grossly normal mood and affect, speech fluent and appropriate  Data Reviewed:  Complete  metabolic panel unremarkable. Potassium normal 3.8. Magnesium normal 1.9. CBC revealed stable hemoglobin 11.2. Urine drug screen negative. CT of the abdomen and pelvis negative for acute abdominal or pelvic findings. 1 a chest x-ray negative.   Scheduled Meds: . albuterol  2.5 mg Nebulization Q6H  . carbamazepine  150 mg Oral TID  . gabapentin  300 mg Oral TID  . pantoprazole (PROTONIX) IV  40 mg Intravenous Q24H   Continuous Infusions: . 0.9 % NaCl with KCl 20 mEq / L 100 mL/hr at 11/24/12 0134    Principal Problem:   Hematochezia Active Problems:   Right lower quadrant abdominal pain   Seizure disorder   Hypokalemia   Anemia due to blood loss, acute   Time spent 20 minutes

## 2012-11-24 NOTE — Care Management Note (Signed)
    Page 1 of 1   11/25/2012     2:56:10 PM   CARE MANAGEMENT NOTE 11/25/2012  Patient:  [name], [name]   Account Number:  [id number]  Date Initiated:  11/24/2012  Documentation initiated by:  Sharrie Rothman  Subjective/Objective Assessment:   Pt admitted from home with rectal bleeding. Pt lives with her mother and 4 children. Pt is independent with ADL's.     Action/Plan:   No CM needs noted.   Anticipated DC Date:  11/25/2012   Anticipated DC Plan:  HOME/SELF CARE      DC Planning Services  CM consult      Choice offered to / List presented to:             Status of service:  Completed, signed off Medicare Important Message given?   (If response is "NO", the following Medicare IM given date fields will be blank) Date Medicare IM given:   Date Additional Medicare IM given:    Discharge Disposition:  HOME/SELF CARE  Per UR Regulation:    If discussed at Long Length of Stay Meetings, dates discussed:    Comments:  11/25/12 1455 Arlyss Queen, RN BSN CM Pt discharged home today. No CM needs noted.  11/24/12 1158 Arlyss Queen, RN BSN CM

## 2012-11-24 NOTE — Progress Notes (Signed)
UR Chart Review Completed  

## 2012-11-24 NOTE — Consult Note (Signed)
Reason for Consult: Rectal bleeding Referring Physician: Hospitalist services  [name] is an 32 y.o. female.  HPI: Admitted thru the ED last night with c/o rectal bleeding. Rectal bleeding started 4-5 days ago. She describes the bleeding as a lot.  She has had rectal bleeding in the past but not as bad. She apparently saw Dr. Darrick Penna in 2010 with the same complaint. She was suppose to have undergone a colonoscopy at that time but she did not have Medicaid, so this was postponed.  She has lower abdominal pain and pain in her rt lower quadrant. She tells me she has had a fever with her abdominal painof 101-103. Nausea but no vomiting.  She rates her pain at a 9 and as stabbing.  She tells me this am when she had a BM, she saw blood when she wiped. No blood in the stool per Nursing Assistant this am. No fever since admission. She is receiving IV pain medication every 3 hrs.  She has had a partial hysterectomy due to heavy vaginal bleeding. Family hx of colon cancer in a grandmother, grandfather, and great grandfather.  Past Medical History  Diagnosis Date  . Seizures   . Bipolar 1 disorder   . Asthma   . Substance abuse   . Chronic back pain   . Sciatica   . Diverticulitis     Past Surgical History  Procedure Laterality Date  . Cholecystectomy    . Abdominal hysterectomy    . Abdominal surgery    . Dilation and curettage of uterus      History reviewed. No pertinent family history.  Social History:  reports that she has been smoking Cigarettes.  She has been smoking about 1.00 pack per day. She does not have any smokeless tobacco history on file. She reports that  drinks alcohol. She reports that she does not use illicit drugs.  Allergies:  Allergies  Allergen Reactions  . Metronidazole Other (See Comments)    unknown  . Penicillins Other (See Comments)    Throat swell shut - states can take Amoxicillin safely  . Propoxyphene-Acetaminophen Other (See Comments)     unknown  . Rocephin (Ceftriaxone Sodium) Other (See Comments)    unknown    Medications: I have reviewed the patient's current medications.  Results for orders placed during the hospital encounter of 11/23/12 (from the past 48 hour(s))  URINALYSIS, ROUTINE W REFLEX MICROSCOPIC     Status: Abnormal   Collection Time    11/23/12  6:12 PM      Result Value Range   Color, Urine YELLOW  YELLOW   APPearance CLEAR  CLEAR   Specific Gravity, Urine <1.005 (*) 1.005 - 1.030   pH 6.0  5.0 - 8.0   Glucose, UA NEGATIVE  NEGATIVE mg/dL   Hgb urine dipstick NEGATIVE  NEGATIVE   Bilirubin Urine NEGATIVE  NEGATIVE   Ketones, ur NEGATIVE  NEGATIVE mg/dL   Protein, ur NEGATIVE  NEGATIVE mg/dL   Urobilinogen, UA 0.2  0.0 - 1.0 mg/dL   Nitrite NEGATIVE  NEGATIVE   Leukocytes, UA NEGATIVE  NEGATIVE   Comment: MICROSCOPIC NOT DONE ON URINES WITH NEGATIVE PROTEIN, BLOOD, LEUKOCYTES, NITRITE, OR GLUCOSE <1000 mg/dL.  URINE RAPID DRUG SCREEN (HOSP PERFORMED)     Status: None   Collection Time    11/23/12  6:12 PM      Result Value Range   Opiates NONE DETECTED  NONE DETECTED   Cocaine NONE DETECTED  NONE DETECTED  Benzodiazepines NONE DETECTED  NONE DETECTED   Amphetamines NONE DETECTED  NONE DETECTED   Tetrahydrocannabinol NONE DETECTED  NONE DETECTED   Barbiturates NONE DETECTED  NONE DETECTED   Comment:            DRUG SCREEN FOR MEDICAL PURPOSES     ONLY.  IF CONFIRMATION IS NEEDED     FOR ANY PURPOSE, NOTIFY LAB     WITHIN 5 DAYS.                LOWEST DETECTABLE LIMITS     FOR URINE DRUG SCREEN     Drug Class       Cutoff (ng/mL)     Amphetamine      1000     Barbiturate      200     Benzodiazepine   200     Tricyclics       300     Opiates          300     Cocaine          300     THC              50  CBC WITH DIFFERENTIAL     Status: Abnormal   Collection Time    11/23/12  7:37 PM      Result Value Range   WBC 7.1  4.0 - 10.5 K/uL   RBC 3.56 (*) 3.87 - 5.11 MIL/uL   Hemoglobin  11.1 (*) 12.0 - 15.0 g/dL   HCT 16.1 (*) 09.6 - 04.5 %   MCV 93.5  78.0 - 100.0 fL   MCH 31.2  26.0 - 34.0 pg   MCHC 33.3  30.0 - 36.0 g/dL   RDW 40.9  81.1 - 91.4 %   Platelets 237  150 - 400 K/uL   Neutrophils Relative % 54  43 - 77 %   Neutro Abs 3.8  1.7 - 7.7 K/uL   Lymphocytes Relative 40  12 - 46 %   Lymphs Abs 2.8  0.7 - 4.0 K/uL   Monocytes Relative 4  3 - 12 %   Monocytes Absolute 0.3  0.1 - 1.0 K/uL   Eosinophils Relative 2  0 - 5 %   Eosinophils Absolute 0.1  0.0 - 0.7 K/uL   Basophils Relative 1  0 - 1 %   Basophils Absolute 0.1  0.0 - 0.1 K/uL  COMPREHENSIVE METABOLIC PANEL     Status: Abnormal   Collection Time    11/23/12  7:37 PM      Result Value Range   Sodium 139  135 - 145 mEq/L   Potassium 3.2 (*) 3.5 - 5.1 mEq/L   Chloride 106  96 - 112 mEq/L   CO2 25  19 - 32 mEq/L   Glucose, Bld 76  70 - 99 mg/dL   BUN 4 (*) 6 - 23 mg/dL   Creatinine, Ser 7.82  0.50 - 1.10 mg/dL   Calcium 8.6  8.4 - 95.6 mg/dL   Total Protein 6.0  6.0 - 8.3 g/dL   Albumin 3.1 (*) 3.5 - 5.2 g/dL   AST 14  0 - 37 U/L   ALT 9  0 - 35 U/L   Alkaline Phosphatase 81  39 - 117 U/L   Total Bilirubin 0.2 (*) 0.3 - 1.2 mg/dL   GFR calc non Af Amer >90  >90 mL/min   GFR calc Af Amer >90  >90 mL/min  Comment:            The eGFR has been calculated     using the CKD EPI equation.     This calculation has not been     validated in all clinical     situations.     eGFR's persistently     <90 mL/min signify     possible Chronic Kidney Disease.  LIPASE, BLOOD     Status: None   Collection Time    11/23/12  7:37 PM      Result Value Range   Lipase 17  11 - 59 U/L  PROTIME-INR     Status: None   Collection Time    11/24/12 12:32 AM      Result Value Range   Prothrombin Time 13.6  11.6 - 15.2 seconds   INR 1.05  0.00 - 1.49  APTT     Status: Abnormal   Collection Time    11/24/12 12:32 AM      Result Value Range   aPTT 41 (*) 24 - 37 seconds   Comment:            IF BASELINE aPTT  IS ELEVATED,     SUGGEST PATIENT RISK ASSESSMENT     BE USED TO DETERMINE APPROPRIATE     ANTICOAGULANT THERAPY.  CBC     Status: Abnormal   Collection Time    11/24/12 12:32 AM      Result Value Range   WBC 5.1  4.0 - 10.5 K/uL   RBC 3.66 (*) 3.87 - 5.11 MIL/uL   Hemoglobin 11.4 (*) 12.0 - 15.0 g/dL   HCT 29.5 (*) 28.4 - 13.2 %   MCV 93.4  78.0 - 100.0 fL   MCH 31.1  26.0 - 34.0 pg   MCHC 33.3  30.0 - 36.0 g/dL   RDW 44.0  10.2 - 72.5 %   Platelets 242  150 - 400 K/uL  URINE RAPID DRUG SCREEN (HOSP PERFORMED)     Status: None   Collection Time    11/24/12  1:00 AM      Result Value Range   Opiates NONE DETECTED  NONE DETECTED   Comment: QUESTIONABLE RESULTS, RECOMMEND RECOLLECT TO VERIFY   Cocaine NONE DETECTED  NONE DETECTED   Comment: QUESTIONABLE RESULTS, RECOMMEND RECOLLECT TO VERIFY   Benzodiazepines NONE DETECTED  NONE DETECTED   Comment: QUESTIONABLE RESULTS, RECOMMEND RECOLLECT TO VERIFY   Amphetamines NONE DETECTED  NONE DETECTED   Comment: QUESTIONABLE RESULTS, RECOMMEND RECOLLECT TO VERIFY   Tetrahydrocannabinol NONE DETECTED  NONE DETECTED   Comment: QUESTIONABLE RESULTS, RECOMMEND RECOLLECT TO VERIFY   Barbiturates NONE DETECTED  NONE DETECTED   Comment:            DRUG SCREEN FOR MEDICAL PURPOSES     ONLY.  IF CONFIRMATION IS NEEDED     FOR ANY PURPOSE, NOTIFY LAB     WITHIN 5 DAYS.                LOWEST DETECTABLE LIMITS     FOR URINE DRUG SCREEN     Drug Class       Cutoff (ng/mL)     Amphetamine      1000     Barbiturate      200     Benzodiazepine   200     Tricyclics       300     Opiates  300     Cocaine          300     THC              50     QUESTIONABLE RESULTS, RECOMMEND RECOLLECT TO VERIFY  COMPREHENSIVE METABOLIC PANEL     Status: Abnormal   Collection Time    11/24/12  5:37 AM      Result Value Range   Sodium 140  135 - 145 mEq/L   Potassium 3.8  3.5 - 5.1 mEq/L   Chloride 109  96 - 112 mEq/L   CO2 24  19 - 32 mEq/L    Glucose, Bld 81  70 - 99 mg/dL   BUN 3 (*) 6 - 23 mg/dL   Creatinine, Ser 7.82  0.50 - 1.10 mg/dL   Calcium 8.1 (*) 8.4 - 10.5 mg/dL   Total Protein 5.7 (*) 6.0 - 8.3 g/dL   Albumin 2.9 (*) 3.5 - 5.2 g/dL   AST 14  0 - 37 U/L   ALT 9  0 - 35 U/L   Alkaline Phosphatase 74  39 - 117 U/L   Total Bilirubin 0.1 (*) 0.3 - 1.2 mg/dL   GFR calc non Af Amer >90  >90 mL/min   GFR calc Af Amer >90  >90 mL/min   Comment:            The eGFR has been calculated     using the CKD EPI equation.     This calculation has not been     validated in all clinical     situations.     eGFR's persistently     <90 mL/min signify     possible Chronic Kidney Disease.  CBC     Status: Abnormal   Collection Time    11/24/12  5:37 AM      Result Value Range   WBC 4.1  4.0 - 10.5 K/uL   RBC 3.65 (*) 3.87 - 5.11 MIL/uL   Hemoglobin 11.2 (*) 12.0 - 15.0 g/dL   HCT 95.6 (*) 21.3 - 08.6 %   MCV 94.2  78.0 - 100.0 fL   MCH 30.7  26.0 - 34.0 pg   MCHC 32.6  30.0 - 36.0 g/dL   RDW 57.8  46.9 - 62.9 %   Platelets 211  150 - 400 K/uL  MAGNESIUM     Status: None   Collection Time    11/24/12  5:37 AM      Result Value Range   Magnesium 1.9  1.5 - 2.5 mg/dL    Ct Abdomen Pelvis W Contrast  11/23/2012   *RADIOLOGY REPORT*  Clinical Data: Right lower quadrant and suprapubic abdominal pain.  CT ABDOMEN AND PELVIS WITH CONTRAST  Technique:  Multidetector CT imaging of the abdomen and pelvis was performed following the standard protocol during bolus administration of intravenous contrast.  Contrast: 50mL OMNIPAQUE IOHEXOL 300 MG/ML  SOLN, OMNIPAQUE IOHEXOL 300 MG/ML  SOLN  Comparison: None.  Findings: The lung bases are clear.  No pericardial fluid.  No focal hepatic lesion.  Post cholecystectomy.  The pancreas, spleen, adrenal glands, and kidneys are normal.  The stomach, small bowel, appendix, and cecum are normal.  The colon and rectosigmoid colon are normal.  Abdominal aorta is normal caliber.  No  retroperitoneal or periportal lymphadenopathy.  No free in  fluid the pelvis.  No distal ureteral stones or bladder stones.  Post hysterectomy.  Ovaries are normal.  No  aggressive osseous lesions.  IMPRESSION: 1.  No acute abdominal or pelvic findings. 2.  Cholecystectomy and hysterectomy.   Original Report Authenticated By: Genevive Bi, M.D.   Dg Chest Port 1 View  11/24/2012   *RADIOLOGY REPORT*  Clinical Data: Wheezing.  PORTABLE CHEST - 1 VIEW  Comparison: 10/25/2012.  Findings: Trachea is midline.  Heart size normal.  Lungs are clear. No pleural fluid.  IMPRESSION: No acute findings.   Original Report Authenticated By: Leanna Battles, M.D.    ROS Blood pressure 95/60, pulse 69, temperature 98 F (36.7 C), temperature source Oral, resp. rate 18, height 5\' 8"  (1.727 m), weight 145 lb (65.772 kg), SpO2 100.00%. Physical Exam Alert and oriented. Skin warm and dry. Oral mucosa is moist.   . Sclera anicteric, conjunctivae is pink. Thyroid not enlarged. No cervical lymphadenopathy. Lungs clear. Heart regular rate and rhythm.  Abdomen is soft. Bowel sounds are positive. No hepatomegaly. No abdominal masses felt. Tenderness supra pubic area and mid rt abdomen.  No edema to lower extremities.  Patient had obvious rectal bleeding while I was in the room. (Dripping in the commode)  Assessment/Plan: Rectal bleeding.  Marland Kitchen Hemorrhoidal bleed needs to be ruled out. There were no diverticular on CT.  There is a family hx colon cancer. I will discuss with Dr. Karilyn Cota for possible colonoscopy. Iron studies.   SETZER,TERRI W 11/24/2012, 8:34 AM   GI attending note; Patient interviewed and examined. Abdominopelvic CT from admission reviewed. Patient is 32 year old Caucasian female who presents with four-day history of large volume hematochezia. She also complains of pain across her lower abdomen. No history of antibiotic, NSAIDs use or diarrhea. Family history significant for inflammatory bowel disease. We  could be dealing with colonic diverticular bleed she could also have acute colitis or polyps. Patient is agreeable to proceed with diagnostic colonoscopy.

## 2012-11-25 ENCOUNTER — Encounter (HOSPITAL_COMMUNITY): Payer: Self-pay | Admitting: Internal Medicine

## 2012-11-25 LAB — CBC
HCT: 34 % — ABNORMAL LOW (ref 36.0–46.0)
MCH: 30.7 pg (ref 26.0–34.0)
WBC: 7.3 10*3/uL (ref 4.0–10.5)

## 2012-11-25 MED ORDER — HYDROCORTISONE ACETATE 25 MG RE SUPP: 25.0000 mg | Freq: Two times a day (BID) | RECTAL | Status: DC

## 2012-11-25 MED ORDER — DICYCLOMINE HCL 10 MG PO CAPS: 10.0000 mg | ORAL_CAPSULE | Freq: Three times a day (TID) | ORAL | Status: DC

## 2012-11-25 MED ORDER — TRAMADOL HCL 50 MG PO TABS: 50.0000 mg | ORAL_TABLET | Freq: Four times a day (QID) | ORAL | Status: DC | PRN

## 2012-11-25 NOTE — Progress Notes (Signed)
Patient ID: [name], female   DOB: 1981/06/02, 32 y.o.   MRN: [id number] States she feels a little better. Continues to have some rectal bleeding. No bleeding since 5am. She ate most of her breakfast.  Continues to have mid rt abdominal pain. Rates pain at an 8 now. Underwent a colonoscopy yesterday:Findings:  Prep excellent.  Normal mucosa of the colon and rectum.  Hemorrhoids noted below the dentate line along with surface erosion and friability at dentate line. Hemoglobin remains stable.    She is receiving Vicodin and Dilaudid every 4 hrs. O.  Filed Vitals:   11/24/12 1715 11/24/12 2052 11/25/12 0648 11/25/12 0743  BP: 99/63 90/54 104/67   Pulse: 80 79 81   Temp: 98.1 F (36.7 C) 98.4 F (36.9 C) 98 F (36.7 C)   TempSrc: Oral Oral Oral   Resp: 18 20 20    Height:      Weight:      SpO2: 99% 99% 99% 94%   CBC    Component Value Date/Time   WBC 7.3 11/25/2012 0544   RBC 3.65* 11/25/2012 0544   HGB 11.2* 11/25/2012 0544   HCT 34.0* 11/25/2012 0544   PLT 234 11/25/2012 0544   MCV 93.2 11/25/2012 0544   MCH 30.7 11/25/2012 0544   MCHC 32.9 11/25/2012 0544   RDW 13.3 11/25/2012 0544   LYMPHSABS 2.8 11/23/2012 1937   MONOABS 0.3 11/23/2012 1937   EOSABS 0.1 11/23/2012 1937   BASOSABS 0.1 11/23/2012 1937     Assessment: Abdominal pain: Hemorroidal bleed. Agree with Anusol Supp BID.

## 2012-11-25 NOTE — Discharge Summary (Signed)
Physician Discharge Summary  [name] ZOX:[id number] DOB: 09/17/1980 DOA: 11/23/2012  PCP: [facility]  Admit date: 11/23/2012 Discharge date: 11/25/2012  Recommendations for Outpatient Follow-up:  1. Followup rectal bleeding from external hemorrhoids 2. Followup suspected IBS   Follow-up Information   Follow up with REHMAN,NAJEEB U, MD. Schedule an appointment as soon as possible for a visit in 3 weeks.   Contact information:   [address], [address] [place] [address] [phone]      Discharge Diagnoses:  1. Rectal bleeding secondary to external hemorrhoids with surface erosion dentate line 2. Suspected IBS 3. Lower abdominal pain 4. Minimal acute blood loss anemia  Discharge Condition: Improved Disposition: Home  Diet recommendation: Regular  Filed Weights   11/23/12 1741  Weight: 65.772 kg (145 lb)    History of present illness:  32 year old woman presented with history of rectal bleeding for the last several days prior to admission, lower abdominal pain as well as reported fever at home.  Hospital Course:  Ms. Viswanathan was admitted for rectal bleeding in further evaluation and consultation with gastroenterology. Hemoglobin remained stable the patient did not require transfusion. Colonoscopy performed with results as below. Bleeding felt to be secondary to hemorrhoids and suppositories recommended. IBS also suspected. Bleeding stopped after colonoscopy and patient feels better. She is now stable for discharge.  1. Rectal bleeding: Source as below. CT of the abdomen and pelvis unremarkable. GI consultation appreciated.  2. External hemorrhoids and surface erosion dentate line felt to be source of rectal bleeding, possible IBS: Anusol-HC Suppository one per rectum twice daily for two-week. Dicyclomine 10 mg by mouth a.c. 3. Lower and Right lower quadrant pain with reported fever prior to admission: No fever recorded. CT scan negative.  Lipase normal. Urinalysis negative. Benign exam.  4. Acute blood loss anemia, minimal: CBC stable. 5. History of seizure disorder: Stable. Continue Tegretol. Continue Neurontin. 6. Bipolar disorder: Stable. 7. Asthma: Quiescent. 8. History of Substance abuse: Per chart review history of cocaine skin popping and IV use. Urine drug screen negative this admission. 9. Chronic back pain, sciatica:  10. History of nephrolithiasis: Kidneys appeared normal CT. Monitor. 11. Status post vaginal hysterectomy, left salpingo-oophorectomy, laparoscopic tubal sterilization  Consultants:  Gastroenterology Procedures:  5/28 colonoscopy: Normal except external hemorrhoids and surface erosion dentate line felt to be source of rectal bleeding, possible IBS Antibiotics:  None  Discharge Instructions  Discharge Orders   Future Orders Complete By Expires     Activity as tolerated - No restrictions  As directed     Diet general  As directed     Discharge instructions  As directed     Comments:      Your diagnosed with hemorrhoids which is the cause of your bleeding. Continue medications as instructed. Call you physician or seek immediate medical attention for uncontrolled bleeding, increased pain or worsening of condition.        Medication List    STOP taking these medications       HYDROcodone-acetaminophen 5-325 MG per tablet  Commonly known as:  NORCO/VICODIN     ibuprofen 200 MG tablet  Commonly known as:  ADVIL,MOTRIN      TAKE these medications       albuterol 108 (90 BASE) MCG/ACT inhaler  Commonly known as:  PROVENTIL HFA;VENTOLIN HFA  Inhale 2 puffs into the lungs every 6 (six) hours as needed. Shortness of breath     carbamazepine 100 MG chewable tablet  Commonly known  as:  TEGRETOL  Chew 150 mg by mouth 3 (three) times daily.     dicyclomine 10 MG capsule  Commonly known as:  BENTYL  Take 1 capsule (10 mg total) by mouth 3 (three) times daily before meals.      diphenhydrAMINE 25 MG tablet  Commonly known as:  BENADRYL  Take 25 mg by mouth every 6 (six) hours as needed for itching or allergies.     gabapentin 300 MG capsule  Commonly known as:  NEURONTIN  Take 300 mg by mouth 3 (three) times daily.     hydrocortisone 25 MG suppository  Commonly known as:  ANUSOL-HC  Place 1 suppository (25 mg total) rectally 2 (two) times daily.       Allergies  Allergen Reactions  . Metronidazole Other (See Comments)    unknown  . Penicillins Other (See Comments)    Throat swell shut - states can take Amoxicillin safely  . Propoxyphene-Acetaminophen Other (See Comments)    unknown  . Rocephin (Ceftriaxone Sodium) Other (See Comments)    unknown    The results of significant diagnostics from this hospitalization (including imaging, microbiology, ancillary and laboratory) are listed below for reference.    Significant Diagnostic Studies: Ct Abdomen Pelvis W Contrast  11/23/2012   *RADIOLOGY REPORT*  Clinical Data: Right lower quadrant and suprapubic abdominal pain.  CT ABDOMEN AND PELVIS WITH CONTRAST  Technique:  Multidetector CT imaging of the abdomen and pelvis was performed following the standard protocol during bolus administration of intravenous contrast.  Contrast: 50mL OMNIPAQUE IOHEXOL 300 MG/ML  SOLN, OMNIPAQUE IOHEXOL 300 MG/ML  SOLN  Comparison: None.  Findings: The lung bases are clear.  No pericardial fluid.  No focal hepatic lesion.  Post cholecystectomy.  The pancreas, spleen, adrenal glands, and kidneys are normal.  The stomach, small bowel, appendix, and cecum are normal.  The colon and rectosigmoid colon are normal.  Abdominal aorta is normal caliber.  No retroperitoneal or periportal lymphadenopathy.  No free in  fluid the pelvis.  No distal ureteral stones or bladder stones.  Post hysterectomy.  Ovaries are normal.  No aggressive osseous lesions.  IMPRESSION: 1.  No acute abdominal or pelvic findings. 2.  Cholecystectomy and  hysterectomy.   Original Report Authenticated By: Genevive Bi, M.D.   Dg Chest Port 1 View  11/24/2012   *RADIOLOGY REPORT*  Clinical Data: Wheezing.  PORTABLE CHEST - 1 VIEW  Comparison: 10/25/2012.  Findings: Trachea is midline.  Heart size normal.  Lungs are clear. No pleural fluid.  IMPRESSION: No acute findings.   Original Report Authenticated By: Leanna Battles, M.D.     Labs: Basic Metabolic Panel:  Recent Labs Lab 11/23/12 1937 11/24/12 0537  NA 139 140  K 3.2* 3.8  CL 106 109  CO2 25 24  GLUCOSE 76 81  BUN 4* 3*  CREATININE 0.68 0.71  CALCIUM 8.6 8.1*  MG  --  1.9   Liver Function Tests:  Recent Labs Lab 11/23/12 1937 11/24/12 0537  AST 14 14  ALT 9 9  ALKPHOS 81 74  BILITOT 0.2* 0.1*  PROT 6.0 5.7*  ALBUMIN 3.1* 2.9*    Recent Labs Lab 11/23/12 1937  LIPASE 17   CBC:  Recent Labs Lab 11/23/12 1937 11/24/12 0032 11/24/12 0537 11/24/12 1202 11/24/12 1815 11/25/12 0544  WBC 7.1 5.1 4.1 4.1 4.7 7.3  NEUTROABS 3.8  --   --   --   --   --   HGB 11.1* 11.4*  11.2* 12.2 10.6* 11.2*  HCT 33.3* 34.2* 34.4* 37.4 32.6* 34.0*  MCV 93.5 93.4 94.2 95.2 94.5 93.2  PLT 237 242 211 215 212 234    Principal Problem:   Hematochezia Active Problems:   Right lower quadrant abdominal pain   Seizure disorder   Hypokalemia   Anemia due to blood loss, acute   Time coordinating discharge: 25 minutes  Signed:  Brendia Sacks, MD [facility] 11/25/2012, 11:37 AM

## 2012-11-25 NOTE — Progress Notes (Signed)
11/25/12 1233 Reviewed discharge instructions with patient via teachback method. Given copy of instructions, medication list, f/u appointment scheduled with Dr Karilyn Cota. Given education sheets for rectal bleeding and abdominal pain, reviewed signs and symptoms to watch for, when to call MD/seek medical attention. Pt verbalized understanding, states will f/u with Dr Karilyn Cota as instructed. Stated pain decreased after receiving pain medication as ordered. Prescriptions called in to [facility] per Dr Irene Limbo. Pt states that is correct pharmacy. Requested pain medication prescription. Discussed with Dr Irene Limbo, stated patient to take Bentyl and Anusol as ordered per Dr Karilyn Cota. recommendations. May take tylenol as needed. Reinforced need to stop Vicodin and ibuprofen as instructed per Dr Irene Limbo. Pt stated understood. IV site d/c'd and within normal limits. Pt was in stable condition awaiting family arrival for discharge home. Stated "calling my sister to pick me up before she goes to work". Instructed to notify nursing staff when ready for discharge. Pt left floor in stable condition, via ambulation unescorted by nursing staff this afternoon per charge nurse. Morrie Sheldon Jaquane Boughner,RN

## 2012-11-25 NOTE — Progress Notes (Signed)
11/25/12 1135 Patient c/o persistent abdominal pain this morning despite pain medication as ordered PRN. Tearful and stated pain 9/10. Given dilaudid 1 mg IV as ordered PRN for pain. Emotional support provided, pt less tearful after receiving pain medication, stated would like to go home if possible. Denies any further stools or rectal bleeding this morning. Discussed pain and management with Dr Irene Limbo. Patient discussed pain management with him on rounds this morning as well. States patient to be discharged home today. Earnstine Regal, RN

## 2012-11-25 NOTE — Progress Notes (Signed)
[facility] PROGRESS NOTE  [name] ZOX:[id number] DOB: 09/07/1980 DOA: 11/23/2012 PCP: No PCP Per Patient [facility] health Department  Assessment/Plan: 1. Rectal bleeding: Source as below. CT of the abdomen and pelvis unremarkable. GI consultation appreciated.  2. External hemorrhoids and surface erosion dentate line felt to be source of rectal bleeding, possible IBS: Anusol-HC Suppository one per rectum twice daily for two-week. Dicyclomine 10 mg by mouth a.c. 3. Lower and Right lower quadrant pain with reported fever prior to admission: No fever recorded. CT scan negative. Lipase normal. Urinalysis negative. Benign exam.  4. Acute blood loss anemia, minimal: CBC stable. 5. History of seizure disorder: Stable. Continue Tegretol. Continue Neurontin. 6. Bipolar disorder: Stable. 7. Asthma: Quiescent. 8. History of Substance abuse: Per chart review history of cocaine skin popping and IV use. Urine drug screen negative this admission. 9. Chronic back pain, sciatica:  10. History of nephrolithiasis: Kidneys appeared normal CT. Monitor. 11. Status post vaginal hysterectomy, left salpingo-oophorectomy, laparoscopic tubal sterilization   Home today   Code Status: Full code  DVT prophylaxis: SCDs  Family Communication: None present  Disposition Plan: When improved 1-2 days.  Brendia Sacks, MD  [facility]  Pager [phone] If 7PM-7AM, please contact night-coverage at [email or web address], password [facility] 11/25/2012, 11:10 AM  LOS: 2 days   Clinical Summary: 32 y.o. female with a past medical history of seizure disorder, and bipolar disorder, along with asthma, who was in her usual state of health about 3 days ago, when she started having rectal bleeding. She's had about 2-3 episodes in a daily basis. She tells me that she has had large quantities of blood in her commode. She has had fever and chills and tells me that she has had a temperature up to 103F 2 days ago.  This is associated with a stabbing pain in the lower abdomen radiating to the right side. The pain is 9/10 in intensity. There is no aggravating or relieving factors. There is no precipitating factor. There has been nausea, but no vomiting. She has had some pressure-like sensation with urination. Denies any blood in the urine. She had similar symptoms about a year ago, but has never seen a gastroenterologist. She's requesting pain medications. She's concerned about appendicitis. ED physician did a rectal exam and did not see any blood in the rectal vault.  Consultants:  Gastroenterology  Procedures:  5/28 colonoscopy: Normal except external hemorrhoids and surface erosion dentate line felt to be source of rectal bleeding, possible IBS  Antibiotics:  None  HPI/Subjective: Underwent colonoscopy yesterday with results as above. Discussed with Dr. Myrtis Hopping felt patient could be discharged when medically stable. Patient remains afebrile with vitals stable. No fever recorded during this hospitalization. Excellent oral intake. Overall feels improved. Mild abdominal pain. No bleeding per RN since colonoscopy. Tolerating diet. Patient wants to go home.  Objective: Filed Vitals:   11/24/12 1715 11/24/12 2052 11/25/12 0648 11/25/12 0743  BP: 99/63 90/54 104/67   Pulse: 80 79 81   Temp: 98.1 F (36.7 C) 98.4 F (36.9 C) 98 F (36.7 C)   TempSrc: Oral Oral Oral   Resp: 18 20 20    Height:      Weight:      SpO2: 99% 99% 99% 94%    Intake/Output Summary (Last 24 hours) at 11/25/12 1110 Last data filed at 11/24/12 1500  Gross per 24 hour  Intake   1058 ml  Output    500 ml  Net    558 ml  Filed Weights   11/23/12 1741  Weight: 65.772 kg (145 lb)    Exam:  General: Appears calm and comfortable Cardiovascular: RRR, no m/r/g Respiratory: CTA bilaterally, no w/r/r. Normal respiratory effort. Abdomen: soft, ntnd, positive bowel sounds. No apparent palpation. Psychiatric: grossly  normal mood and affect, speech fluent and appropriate  Data Reviewed:  Hemoglobin stable 11.2, no significant change since admission.   Scheduled Meds: . albuterol  2 puff Inhalation TID  . carbamazepine  150 mg Oral TID  . dicyclomine  10 mg Oral TID AC  . gabapentin  300 mg Oral TID  . hydrocortisone  25 mg Rectal BID  . nicotine  21 mg Transdermal Daily  . pantoprazole (PROTONIX) IV  40 mg Intravenous Q24H   Continuous Infusions:    Principal Problem:   Hematochezia Active Problems:   Right lower quadrant abdominal pain   Seizure disorder   Hypokalemia   Anemia due to blood loss, acute

## 2012-11-25 NOTE — Plan of Care (Signed)
Problem: Phase III Progression Outcomes Goal: H&H stablized <1gm drop in 24 hrs, no active bleeding Outcome: Completed/Met Date Met:  11/25/12 Patient stated no further stools today, no rectal bleeding observed per nursing staff. MD aware.

## 2012-12-01 ENCOUNTER — Telehealth (INDEPENDENT_AMBULATORY_CARE_PROVIDER_SITE_OTHER): Payer: Self-pay | Admitting: *Deleted

## 2012-12-01 NOTE — Telephone Encounter (Signed)
Patient states that her medicaid want pay for it and she is not going to get it.

## 2012-12-01 NOTE — Telephone Encounter (Signed)
Her insurance will not pay for the hydrocortisone suppository. Is there anything else she can use that [facility] insurance will pay for. She is also in a lot of pay and would like to get something to help with this also. Her return phone number is [phone].

## 2012-12-01 NOTE — Telephone Encounter (Signed)
Per Dr.Rehman this is the only medication to be used. She will need to make an appointment with her PCP to discuss pain medication. Patient was called and made aware.

## 2012-12-15 ENCOUNTER — Ambulatory Visit (INDEPENDENT_AMBULATORY_CARE_PROVIDER_SITE_OTHER): Payer: Medicaid Other | Admitting: Internal Medicine

## 2012-12-16 ENCOUNTER — Ambulatory Visit (INDEPENDENT_AMBULATORY_CARE_PROVIDER_SITE_OTHER): Payer: Medicaid Other | Admitting: Internal Medicine

## 2013-01-11 ENCOUNTER — Emergency Department (HOSPITAL_COMMUNITY): Payer: Medicaid Other

## 2013-01-11 ENCOUNTER — Emergency Department (HOSPITAL_COMMUNITY)
Admission: EM | Admit: 2013-01-11 | Discharge: 2013-01-12 | Disposition: A | Discharge status: Home / Self Care | Payer: Medicaid Other | Attending: Emergency Medicine | Admitting: Emergency Medicine

## 2013-01-11 ENCOUNTER — Encounter (HOSPITAL_COMMUNITY): Payer: Self-pay | Admitting: *Deleted

## 2013-01-11 DIAGNOSIS — S60219A Contusion of unspecified wrist, initial encounter: Secondary | ICD-10-CM | POA: Insufficient documentation

## 2013-01-11 DIAGNOSIS — Z79899 Other long term (current) drug therapy: Secondary | ICD-10-CM | POA: Insufficient documentation

## 2013-01-11 DIAGNOSIS — F1910 Other psychoactive substance abuse, uncomplicated: Secondary | ICD-10-CM | POA: Insufficient documentation

## 2013-01-11 DIAGNOSIS — S60211A Contusion of right wrist, initial encounter: Secondary | ICD-10-CM

## 2013-01-11 DIAGNOSIS — G40909 Epilepsy, unspecified, not intractable, without status epilepticus: Secondary | ICD-10-CM | POA: Insufficient documentation

## 2013-01-11 DIAGNOSIS — Y939 Activity, unspecified: Secondary | ICD-10-CM | POA: Insufficient documentation

## 2013-01-11 DIAGNOSIS — J45909 Unspecified asthma, uncomplicated: Secondary | ICD-10-CM | POA: Insufficient documentation

## 2013-01-11 DIAGNOSIS — Z8719 Personal history of other diseases of the digestive system: Secondary | ICD-10-CM | POA: Insufficient documentation

## 2013-01-11 DIAGNOSIS — W010XXA Fall on same level from slipping, tripping and stumbling without subsequent striking against object, initial encounter: Secondary | ICD-10-CM | POA: Insufficient documentation

## 2013-01-11 DIAGNOSIS — Y929 Unspecified place or not applicable: Secondary | ICD-10-CM | POA: Insufficient documentation

## 2013-01-11 DIAGNOSIS — F17200 Nicotine dependence, unspecified, uncomplicated: Secondary | ICD-10-CM | POA: Insufficient documentation

## 2013-01-11 DIAGNOSIS — Z880 Allergy status to penicillin: Secondary | ICD-10-CM | POA: Insufficient documentation

## 2013-01-11 DIAGNOSIS — Y9389 Activity, other specified: Secondary | ICD-10-CM | POA: Insufficient documentation

## 2013-01-11 DIAGNOSIS — S40019A Contusion of unspecified shoulder, initial encounter: Secondary | ICD-10-CM | POA: Insufficient documentation

## 2013-01-11 DIAGNOSIS — S40011A Contusion of right shoulder, initial encounter: Secondary | ICD-10-CM

## 2013-01-11 DIAGNOSIS — F319 Bipolar disorder, unspecified: Secondary | ICD-10-CM | POA: Insufficient documentation

## 2013-01-11 NOTE — ED Notes (Signed)
PA at bedside.

## 2013-01-11 NOTE — ED Notes (Signed)
Pt fell into a wooden door tonight, now has pain to right hand and right shoulder

## 2013-01-12 ENCOUNTER — Telehealth: Payer: Self-pay | Admitting: Orthopedic Surgery

## 2013-01-12 MED ORDER — IBUPROFEN 600 MG PO TABS: 600.0000 mg | ORAL_TABLET | Freq: Four times a day (QID) | ORAL | Status: DC | PRN

## 2013-01-12 MED ORDER — IBUPROFEN 800 MG PO TABS
800.0000 mg | ORAL_TABLET | Freq: Once | ORAL | Status: AC
  Administered 2013-01-12: 800 mg via ORAL
  Filled 2013-01-12: qty 1

## 2013-01-12 MED ORDER — TRAMADOL HCL 50 MG PO TABS: 50.0000 mg | ORAL_TABLET | Freq: Four times a day (QID) | ORAL | Status: DC | PRN

## 2013-01-12 MED ORDER — TRAMADOL HCL 50 MG PO TABS
50.0000 mg | ORAL_TABLET | Freq: Once | ORAL | Status: AC
  Administered 2013-01-12: 50 mg via ORAL
  Filled 2013-01-12: qty 1

## 2013-01-12 NOTE — Telephone Encounter (Signed)
Patient called following Emergency Room visit at [facility] last evening 01/11/13 for problem right wrist and right shoulder injury. Patient mentioned that she's had surgery on the right wrist by hand surgeon in [place].  In addition, her insurance requires referral authorization from primary care, which is [facility].  Patient will contact providers for notes and for referrals.  Her ph# is [phone].  Aware that appointment is pending this information.

## 2013-01-15 NOTE — ED Provider Notes (Signed)
History    CSN: [id number] Arrival date & time 01/11/13  2213  First MD Initiated Contact with Patient 01/11/13 2322     Chief Complaint  Patient presents with  . Arm Pain  . Shoulder Pain   (Consider location/radiation/quality/duration/timing/severity/associated sxs/prior Treatment) HPI Comments: [name] is a 32 y.o. Female with right hand and shoulder pain after tripping and falling against a door just prior to arrival.  Her pain is constant, throbbing but worse with extension of the wrist and with attempts to move her shoulder.  She denies numbness or weakness in the arm and denies other injury including head or neck injury or pain.  She has taken no medicine prior to arrival.  Rest improves her pain.     The history is provided by the patient.   Past Medical History  Diagnosis Date  . Seizures   . Bipolar 1 disorder   . Asthma   . Substance abuse   . Chronic back pain   . Sciatica   . Diverticulitis    Past Surgical History  Procedure Laterality Date  . Cholecystectomy    . Abdominal hysterectomy    . Abdominal surgery    . Dilation and curettage of uterus    . Colonoscopy N/A 11/24/2012    Procedure: COLONOSCOPY;  Surgeon: Malissa Hippo, MD;  [facility];  Service: Endoscopy;  Laterality: N/A;   History reviewed. No pertinent family history. History  Substance Use Topics  . Smoking status: Current Every Day Smoker -- 1.00 packs/day    Types: Cigarettes  . Smokeless tobacco: Not on file  . Alcohol Use: 0.0 oz/week     Comment: occ   OB History   Grav Para Term Preterm Abortions TAB SAB Ect Mult Living                 Review of Systems  Constitutional: Negative for fever.  Musculoskeletal: Positive for arthralgias. Negative for myalgias and joint swelling.  Neurological: Negative for weakness and numbness.    Allergies  Metronidazole; Penicillins; Propoxyphene-acetaminophen; and Rocephin  Home Medications   Current Outpatient  Rx  Name  Route  Sig  Dispense  Refill  . albuterol (PROVENTIL HFA;VENTOLIN HFA) 108 (90 BASE) MCG/ACT inhaler   Inhalation   Inhale 2 puffs into the lungs every 6 (six) hours as needed. Shortness of breath         . carbamazepine (TEGRETOL) 100 MG chewable tablet   Oral   Chew 150 mg by mouth 3 (three) times daily.           Marland Kitchen dicyclomine (BENTYL) 10 MG capsule   Oral   Take 1 capsule (10 mg total) by mouth 3 (three) times daily before meals.   90 capsule   0   . diphenhydrAMINE (BENADRYL) 25 MG tablet   Oral   Take 25 mg by mouth every 6 (six) hours as needed for itching or allergies.         Marland Kitchen gabapentin (NEURONTIN) 300 MG capsule   Oral   Take 300 mg by mouth 3 (three) times daily.           Marland Kitchen ibuprofen (ADVIL,MOTRIN) 600 MG tablet   Oral   Take 1 tablet (600 mg total) by mouth every 6 (six) hours as needed for pain.   20 tablet   0   . traMADol (ULTRAM) 50 MG tablet   Oral   Take 1 tablet (50 mg total) by mouth  every 6 (six) hours as needed for pain.   15 tablet   0    BP 121/74  Pulse 96  Temp(Src) 98 F (36.7 C) (Oral)  Resp 18  Ht 5\' 8"  (1.727 m)  Wt 153 lb (69.4 kg)  BMI 23.27 kg/m2  SpO2 93% Physical Exam  Constitutional: She appears well-developed and well-nourished.  HENT:  Head: Atraumatic.  Neck: Normal range of motion.  Cardiovascular:  Pulses equal bilaterally  Musculoskeletal: She exhibits tenderness.       Right shoulder: She exhibits no swelling, no effusion, no spasm and normal strength.       Right hand: She exhibits bony tenderness. She exhibits normal capillary refill, no deformity and no swelling. Normal sensation noted.  ttp across right dorsal hand.  Equal grip strength,  No edema.  No focal bony tenderness.  ttp right lateral shoulder, no crepitus,  Pain with active and passive ROM along lateral humeral head.  No edema, erythema, ecchymosis.  Neurological: She is alert. She has normal strength. She displays normal reflexes.  No sensory deficit.  Equal strength  Skin: Skin is warm and dry.  Psychiatric: She has a normal mood and affect.    ED Course  Procedures (including critical care time) Labs Reviewed - No data to display No results found. 1. Wrist contusion, right, initial encounter   2. Shoulder contusion, right, initial encounter     MDM  Patients labs and/or radiological studies were viewed and considered during the medical decision making and disposition process. Pt was prescribed ibuprofen, tramadol.  Ice packs,  Rest, recheck by ortho if not improving over the next week.  Referral given.  Burgess Amor, PA-C 01/15/13 2155

## 2013-01-16 NOTE — ED Provider Notes (Signed)
Medical screening examination/treatment/procedure(s) were performed by non-physician practitioner and as supervising physician I was immediately available for consultation/collaboration.   Mili Piltz, MD 01/16/13 0755 

## 2013-01-25 ENCOUNTER — Encounter: Payer: Self-pay | Admitting: Orthopedic Surgery

## 2013-01-25 ENCOUNTER — Ambulatory Visit (INDEPENDENT_AMBULATORY_CARE_PROVIDER_SITE_OTHER): Payer: Medicaid Other | Admitting: Orthopedic Surgery

## 2013-01-25 VITALS — BP 123/69 | Ht 67.0 in | Wt 152.0 lb

## 2013-01-25 DIAGNOSIS — S40011A Contusion of right shoulder, initial encounter: Secondary | ICD-10-CM

## 2013-01-25 DIAGNOSIS — S60211A Contusion of right wrist, initial encounter: Secondary | ICD-10-CM

## 2013-01-25 DIAGNOSIS — S60219A Contusion of unspecified wrist, initial encounter: Secondary | ICD-10-CM | POA: Insufficient documentation

## 2013-01-25 DIAGNOSIS — S40019A Contusion of unspecified shoulder, initial encounter: Secondary | ICD-10-CM | POA: Insufficient documentation

## 2013-01-25 MED ORDER — NABUMETONE 500 MG PO TABS: 500.0000 mg | ORAL_TABLET | Freq: Two times a day (BID) | ORAL | Status: DC

## 2013-01-25 NOTE — Patient Instructions (Addendum)
Remove sling and splint   Start therapy in [facility]   Start relafen 500 mg twice a day

## 2013-01-25 NOTE — Progress Notes (Signed)
  Subjective:    [name] is a 32 y.o. female this is a 32 year old female who has a history of seizure disorder had a seizure fell and injured her right hand and right shoulder. She has a history of previous incision and drainage for septic arthritis and cellulitis of the right upper extremity with extensor tendon damage which is unrelated  She complains of right shoulder pain and right wrist pain which she describes 9/10 unrelieved by tramadol partially relieved by hydrocodone. She describes sharp throbbing stabbing constant pain with numbness and swelling and painful range of motion of the wrist and shoulder.  I reviewed her x-rays and x-ray reports wrist and shoulder both were normal  The patient is referred to [place] by Dr. Dimas Aguas practice   The following portions of the patient's history were reviewed and updated as appropriate: allergies, current medications, past family history, past medical history, past social history, past surgical history and problem list.  Review of Systems She complains of previous fever and chills her heart murmur shortness of breath cough snoring multiple GI complaints including heartburn and nausea diarrhea poor healing of the skin numbness and tingling dizziness seizure disorder nervousness anxiety depression seasonal allergies   Objective:    BP 123/69  Ht 5\' 7"  (1.702 m)  Wt 152 lb (68.947 kg)  BMI 23.8 kg/m2 General appearance is normal, the patient is alert and oriented x3 with normal mood and affect.         she is ambulating with no assistive devices she has a sling on her right shoulder and wrist splint. She has tenderness over the dorsum of the wrist passive range of motion is painful but normal there is a scar in the dorsum of the wrist joint stability is normal muscle tone is normal no atrophy skin as described pulses good color is good I detect no sensory abnormalities  The right shoulder has normal passive range of motion except for pain  no instability strength and muscle tone are normal skin is normal. Sensation around the deltoid area is normal.  Internal and external rotation reveal no restriction  Assessment:    .dx Contusions right shoulder right wrist    Plan:    I detect no surgical problem here I'm not sure if the patient may be malingering to some degree. She doesn't have her fracture she does have a tear. I recommend therapy and anti-inflammatories no narcotics no followups

## 2013-03-24 ENCOUNTER — Encounter (HOSPITAL_COMMUNITY): Payer: Self-pay

## 2013-03-24 ENCOUNTER — Emergency Department (HOSPITAL_COMMUNITY)
Admission: EM | Admit: 2013-03-24 | Discharge: 2013-03-24 | Disposition: A | Discharge status: Home / Self Care | Payer: Medicaid Other | Attending: Emergency Medicine | Admitting: Emergency Medicine

## 2013-03-24 DIAGNOSIS — J45909 Unspecified asthma, uncomplicated: Secondary | ICD-10-CM | POA: Insufficient documentation

## 2013-03-24 DIAGNOSIS — J029 Acute pharyngitis, unspecified: Secondary | ICD-10-CM | POA: Insufficient documentation

## 2013-03-24 DIAGNOSIS — G40909 Epilepsy, unspecified, not intractable, without status epilepticus: Secondary | ICD-10-CM | POA: Insufficient documentation

## 2013-03-24 DIAGNOSIS — Z880 Allergy status to penicillin: Secondary | ICD-10-CM | POA: Insufficient documentation

## 2013-03-24 DIAGNOSIS — F17200 Nicotine dependence, unspecified, uncomplicated: Secondary | ICD-10-CM | POA: Insufficient documentation

## 2013-03-24 DIAGNOSIS — Z8739 Personal history of other diseases of the musculoskeletal system and connective tissue: Secondary | ICD-10-CM | POA: Insufficient documentation

## 2013-03-24 DIAGNOSIS — Z79899 Other long term (current) drug therapy: Secondary | ICD-10-CM | POA: Insufficient documentation

## 2013-03-24 DIAGNOSIS — J40 Bronchitis, not specified as acute or chronic: Secondary | ICD-10-CM

## 2013-03-24 DIAGNOSIS — G8929 Other chronic pain: Secondary | ICD-10-CM | POA: Insufficient documentation

## 2013-03-24 DIAGNOSIS — J209 Acute bronchitis, unspecified: Secondary | ICD-10-CM | POA: Insufficient documentation

## 2013-03-24 DIAGNOSIS — Z8719 Personal history of other diseases of the digestive system: Secondary | ICD-10-CM | POA: Insufficient documentation

## 2013-03-24 MED ORDER — BENZONATATE 100 MG PO CAPS: 100.0000 mg | ORAL_CAPSULE | Freq: Three times a day (TID) | ORAL | Status: DC | PRN

## 2013-03-24 MED ORDER — PROMETHAZINE-CODEINE 6.25-10 MG/5ML PO SYRP: 5.0000 mL | ORAL_SOLUTION | ORAL | Status: DC | PRN

## 2013-03-24 NOTE — ED Notes (Signed)
Cough for a week. States chest hurts due to cough

## 2013-03-24 NOTE — ED Notes (Signed)
Cough for 2 weeks, "my ribs are sore",  Yellow sputum.  NAD color wnl,  "fever off and on"

## 2013-03-26 NOTE — ED Provider Notes (Signed)
CSN: [id number]     Arrival date & time 03/24/13  1055 History   First MD Initiated Contact with Patient 03/24/13 1114     Chief Complaint  Patient presents with  . Cough   (Consider location/radiation/quality/duration/timing/severity/associated sxs/prior Treatment) HPI Comments: [name] is a 32 y.o. Female with a now 2 week history of frequent cough with yellow sputum which started as uri type symptoms that included nasal congestion and clear drainage, sore throat which is now improving.  Her cough worsens her bilateral anterior lower ribcage pain described as burning and aching.  She denies shortness of breath, wheezing and does not have chest wall pain except when coughing.  She denies fever, chills, nausea, vomiting and abdominal pain. She smokes one ppd.  Her cough is worsened at night.     The history is provided by the patient.    Past Medical History  Diagnosis Date  . Seizures   . Bipolar 1 disorder   . Asthma   . Substance abuse   . Chronic back pain   . Sciatica   . Diverticulitis    Past Surgical History  Procedure Laterality Date  . Cholecystectomy    . Abdominal hysterectomy    . Abdominal surgery    . Dilation and curettage of uterus    . Colonoscopy N/A 11/24/2012    Procedure: COLONOSCOPY;  Surgeon: Malissa Hippo, MD;  Location: [facility];  Service: Endoscopy;  Laterality: N/A;   No family history on file. History  Substance Use Topics  . Smoking status: Current Every Day Smoker -- 1.00 packs/day    Types: Cigarettes  . Smokeless tobacco: Not on file  . Alcohol Use: 0.0 oz/week     Comment: occ   OB History   Grav Para Term Preterm Abortions TAB SAB Ect Mult Living                 Review of Systems  Constitutional: Negative for fever.  HENT: Positive for congestion. Negative for neck pain.   Eyes: Negative.   Respiratory: Positive for cough. Negative for choking, chest tightness and shortness of breath.   Cardiovascular: Positive  for chest pain. Negative for palpitations and leg swelling.  Gastrointestinal: Negative for nausea and abdominal pain.  Genitourinary: Negative.   Musculoskeletal: Negative for joint swelling and arthralgias.  Skin: Negative.  Negative for rash and wound.  Neurological: Negative for dizziness, weakness, light-headedness, numbness and headaches.  Psychiatric/Behavioral: Negative.     Allergies  Metronidazole; Penicillins; Propoxyphene-acetaminophen; Rocephin; and Ultram  Home Medications   Current Outpatient Rx  Name  Route  Sig  Dispense  Refill  . albuterol (PROVENTIL HFA;VENTOLIN HFA) 108 (90 BASE) MCG/ACT inhaler   Inhalation   Inhale 2 puffs into the lungs every 6 (six) hours as needed. Shortness of breath         . carbamazepine (TEGRETOL) 100 MG chewable tablet   Oral   Chew 150 mg by mouth 3 (three) times daily.           Marland Kitchen gabapentin (NEURONTIN) 300 MG capsule   Oral   Take 300 mg by mouth 3 (three) times daily.           . benzonatate (TESSALON) 100 MG capsule   Oral   Take 1 capsule (100 mg total) by mouth 3 (three) times daily as needed for cough.   21 capsule   0   . promethazine-codeine (PHENERGAN WITH CODEINE) 6.25-10 MG/5ML syrup   Oral  Take 5 mLs by mouth every 4 (four) hours as needed for cough.   120 mL   0    BP 108/63  Pulse 92  Temp(Src) 98.2 F (36.8 C) (Oral)  Resp 20  Ht 5\' 8"  (1.727 m)  Wt 160 lb (72.576 kg)  BMI 24.33 kg/m2  SpO2 97% Physical Exam  Constitutional: She is oriented to person, place, and time. She appears well-developed and well-nourished.  HENT:  Head: Normocephalic and atraumatic.  Right Ear: Tympanic membrane and ear canal normal.  Left Ear: Tympanic membrane and ear canal normal.  Nose: Rhinorrhea present. No mucosal edema.  Mouth/Throat: Uvula is midline, oropharynx is clear and moist and mucous membranes are normal. No oropharyngeal exudate, posterior oropharyngeal edema, posterior oropharyngeal erythema or  tonsillar abscesses.  Eyes: Conjunctivae are normal.  Cardiovascular: Normal rate and normal heart sounds.   Pulmonary/Chest: Effort normal. No respiratory distress. She has no decreased breath sounds. She has no wheezes. She has no rhonchi. She has no rales.  Frequent dry cough.  Abdominal: Soft. There is no tenderness.  Musculoskeletal: Normal range of motion. She exhibits no edema.  Neurological: She is alert and oriented to person, place, and time.  Skin: Skin is warm and dry. No rash noted.  Psychiatric: She has a normal mood and affect.    ED Course  Procedures (including critical care time) Labs Review Labs Reviewed - No data to display Imaging Review No results found.  MDM   1. Bronchitis    Acute bronchitis, with normal lung exam.  Offered cxr, pt deferred.  Encouraged smoking cessation.  Prescribed tessalon perles, phenergan/codeine for qhs use.  Prn,  F/u with pcp or return here for worsened sx.    Burgess Amor, PA-C 03/26/13 1235

## 2013-03-28 NOTE — ED Provider Notes (Signed)
Medical screening examination/treatment/procedure(s) were performed by non-physician practitioner and as supervising physician I was immediately available for consultation/collaboration.  Donnetta Hutching, MD 03/28/13 [phone]

## 2013-03-31 ENCOUNTER — Encounter (HOSPITAL_COMMUNITY): Payer: Self-pay | Admitting: *Deleted

## 2013-03-31 ENCOUNTER — Emergency Department (HOSPITAL_COMMUNITY)
Admission: EM | Admit: 2013-03-31 | Discharge: 2013-03-31 | Disposition: A | Discharge status: Home / Self Care | Payer: Medicaid Other | Attending: Emergency Medicine | Admitting: Emergency Medicine

## 2013-03-31 DIAGNOSIS — Z880 Allergy status to penicillin: Secondary | ICD-10-CM | POA: Insufficient documentation

## 2013-03-31 DIAGNOSIS — Z3202 Encounter for pregnancy test, result negative: Secondary | ICD-10-CM | POA: Insufficient documentation

## 2013-03-31 DIAGNOSIS — Z8739 Personal history of other diseases of the musculoskeletal system and connective tissue: Secondary | ICD-10-CM | POA: Insufficient documentation

## 2013-03-31 DIAGNOSIS — H6690 Otitis media, unspecified, unspecified ear: Secondary | ICD-10-CM | POA: Insufficient documentation

## 2013-03-31 DIAGNOSIS — R109 Unspecified abdominal pain: Secondary | ICD-10-CM | POA: Insufficient documentation

## 2013-03-31 DIAGNOSIS — G40909 Epilepsy, unspecified, not intractable, without status epilepticus: Secondary | ICD-10-CM | POA: Insufficient documentation

## 2013-03-31 DIAGNOSIS — Z8719 Personal history of other diseases of the digestive system: Secondary | ICD-10-CM | POA: Insufficient documentation

## 2013-03-31 DIAGNOSIS — R110 Nausea: Secondary | ICD-10-CM | POA: Insufficient documentation

## 2013-03-31 DIAGNOSIS — R300 Dysuria: Secondary | ICD-10-CM | POA: Insufficient documentation

## 2013-03-31 DIAGNOSIS — Z9889 Other specified postprocedural states: Secondary | ICD-10-CM | POA: Insufficient documentation

## 2013-03-31 DIAGNOSIS — F319 Bipolar disorder, unspecified: Secondary | ICD-10-CM | POA: Insufficient documentation

## 2013-03-31 DIAGNOSIS — G8929 Other chronic pain: Secondary | ICD-10-CM | POA: Insufficient documentation

## 2013-03-31 DIAGNOSIS — J45909 Unspecified asthma, uncomplicated: Secondary | ICD-10-CM | POA: Insufficient documentation

## 2013-03-31 DIAGNOSIS — Z90710 Acquired absence of both cervix and uterus: Secondary | ICD-10-CM | POA: Insufficient documentation

## 2013-03-31 DIAGNOSIS — Z79899 Other long term (current) drug therapy: Secondary | ICD-10-CM | POA: Insufficient documentation

## 2013-03-31 DIAGNOSIS — F17200 Nicotine dependence, unspecified, uncomplicated: Secondary | ICD-10-CM | POA: Insufficient documentation

## 2013-03-31 LAB — URINALYSIS, ROUTINE W REFLEX MICROSCOPIC
Glucose, UA: NEGATIVE mg/dL
Ketones, ur: NEGATIVE mg/dL
Leukocytes, UA: NEGATIVE
Protein, ur: NEGATIVE mg/dL
Urobilinogen, UA: 0.2 mg/dL (ref 0.0–1.0)

## 2013-03-31 MED ORDER — NAPROXEN 500 MG PO TABS: 500.0000 mg | ORAL_TABLET | Freq: Two times a day (BID) | ORAL | Status: DC

## 2013-03-31 NOTE — ED Provider Notes (Signed)
CSN: [id number]     Arrival date & time 03/31/13  1054 History   This chart was scribed for Vida Roller, MD, by Yevette Edwards, ED Scribe. This patient was seen in room APA09/APA09 and the patient's care was started at 11:37 AM.  First MD Initiated Contact with Patient 03/31/13 1119     Chief Complaint  Patient presents with  . Dysuria  . Flank Pain   The history is provided by the patient. No language interpreter was used.   HPI Comments: [name] is a 32 y.o. female who presents to the Emergency Department complaining of gradual onset, gradually worsening right flank pain which began two days ago with associated dysuria, abdominal pain, and nausea. She denies experiencing any emesis or fever. She has a h/o chronic back pain and bipolar 1 disorder. The pt is a daily smoker.   Past Medical History  Diagnosis Date  . Seizures   . Bipolar 1 disorder   . Asthma   . Substance abuse   . Chronic back pain   . Sciatica   . Diverticulitis    Past Surgical History  Procedure Laterality Date  . Cholecystectomy    . Abdominal hysterectomy    . Abdominal surgery    . Dilation and curettage of uterus    . Colonoscopy N/A 11/24/2012    Procedure: COLONOSCOPY;  Surgeon: Malissa Hippo, MD;  Location[facility];  Service: Endoscopy;  Laterality: N/A;   No family history on file. History  Substance Use Topics  . Smoking status: Current Every Day Smoker -- 1.00 packs/day    Types: Cigarettes  . Smokeless tobacco: Not on file  . Alcohol Use: 0.0 oz/week     Comment: occ   No OB history provided.  Review of Systems  Constitutional: Negative for fever and chills.  Gastrointestinal: Positive for nausea and abdominal pain. Negative for vomiting.  Genitourinary: Positive for dysuria and flank pain.    Allergies  Metronidazole; Penicillins; Propoxyphene-acetaminophen; Rocephin; and Ultram  Home Medications   Current Outpatient Rx  Name  Route  Sig  Dispense  Refill  .  albuterol (PROVENTIL HFA;VENTOLIN HFA) 108 (90 BASE) MCG/ACT inhaler   Inhalation   Inhale 2 puffs into the lungs every 6 (six) hours as needed. Shortness of breath         . carbamazepine (TEGRETOL) 100 MG chewable tablet   Oral   Chew 150 mg by mouth 3 (three) times daily.           Marland Kitchen gabapentin (NEURONTIN) 300 MG capsule   Oral   Take 300 mg by mouth 3 (three) times daily.           . naproxen (NAPROSYN) 500 MG tablet   Oral   Take 1 tablet (500 mg total) by mouth 2 (two) times daily with a meal.   30 tablet   0    Triage Vitals: BP 123/72  Pulse 98  Temp(Src) 97.9 F (36.6 C) (Oral)  Resp 16  Ht 5\' 8"  (1.727 m)  Wt 160 lb (72.576 kg)  BMI 24.33 kg/m2  SpO2 99%  Physical Exam  Nursing note and vitals reviewed. Constitutional: She is oriented to person, place, and time. She appears well-developed and well-nourished. No distress.  HENT:  Head: Normocephalic and atraumatic.  Eyes: EOM are normal.  Neck: Neck supple. No tracheal deviation present.  Cardiovascular: Normal rate.   Pulmonary/Chest: Effort normal. No respiratory distress.  Abdominal: Soft. There is tenderness.  There is no guarding.  Mild suprapubic tenderness.  Very soft abdomen.  No guarding.   Musculoskeletal: Normal range of motion.  Right CVA tenderness.   Neurological: She is alert and oriented to person, place, and time.  Skin: Skin is warm and dry.  Psychiatric: She has a normal mood and affect. Her behavior is normal.    ED Course  Procedures (including critical care time)  DIAGNOSTIC STUDIES: Oxygen Saturation is 99% on room air, normal by my interpretation.    COORDINATION OF CARE:  11:40 AM- Discussed treatment plan with patient, and the patient agreed to the plan.   Labs Review Labs Reviewed  URINALYSIS, ROUTINE W REFLEX MICROSCOPIC - Abnormal; Notable for the following:    APPearance HAZY (*)    All other components within normal limits  PREGNANCY, URINE   Imaging  Review No results found.  MDM   1. Flank pain    Laboratory workup shows normal urinalysis without any signs of infection or dehydration or hematuria. The patient has reproducible pain in her back to palpation and movement, I suspect this is musculoskeletal. She has normal vital signs, will be treated with anti-inflammatories and sent home.  Meds given in ED:  Medications - No data to display  New Prescriptions   NAPROXEN (NAPROSYN) 500 MG TABLET    Take 1 tablet (500 mg total) by mouth 2 (two) times daily with a meal.      I personally performed the services described in this documentation, which was scribed in my presence. The recorded information has been reviewed and is accurate.      Vida Roller, MD 03/31/13 1242

## 2013-03-31 NOTE — ED Notes (Signed)
Pt states right flank pain and dysuria began 2 days ago.

## 2013-03-31 NOTE — ED Notes (Signed)
Pt alert & oriented x4, stable gait. Patient given discharge instructions, paperwork & prescription(s). Patient  instructed to stop at the registration desk to finish any additional paperwork. Patient verbalized understanding. Pt left department w/ no further questions. 

## 2013-07-22 ENCOUNTER — Emergency Department (HOSPITAL_COMMUNITY): Payer: Medicaid Other

## 2013-07-22 ENCOUNTER — Encounter (HOSPITAL_COMMUNITY): Payer: Self-pay | Admitting: Emergency Medicine

## 2013-07-22 ENCOUNTER — Emergency Department (HOSPITAL_COMMUNITY)
Admission: EM | Admit: 2013-07-22 | Discharge: 2013-07-22 | Disposition: A | Discharge status: Home / Self Care | Payer: Medicaid Other | Attending: Emergency Medicine | Admitting: Emergency Medicine

## 2013-07-22 DIAGNOSIS — Z880 Allergy status to penicillin: Secondary | ICD-10-CM | POA: Insufficient documentation

## 2013-07-22 DIAGNOSIS — Z8739 Personal history of other diseases of the musculoskeletal system and connective tissue: Secondary | ICD-10-CM | POA: Insufficient documentation

## 2013-07-22 DIAGNOSIS — N83201 Unspecified ovarian cyst, right side: Secondary | ICD-10-CM

## 2013-07-22 DIAGNOSIS — F17200 Nicotine dependence, unspecified, uncomplicated: Secondary | ICD-10-CM | POA: Insufficient documentation

## 2013-07-22 DIAGNOSIS — Z79899 Other long term (current) drug therapy: Secondary | ICD-10-CM | POA: Insufficient documentation

## 2013-07-22 DIAGNOSIS — F319 Bipolar disorder, unspecified: Secondary | ICD-10-CM | POA: Insufficient documentation

## 2013-07-22 DIAGNOSIS — J45909 Unspecified asthma, uncomplicated: Secondary | ICD-10-CM | POA: Insufficient documentation

## 2013-07-22 DIAGNOSIS — G8929 Other chronic pain: Secondary | ICD-10-CM | POA: Insufficient documentation

## 2013-07-22 DIAGNOSIS — G40909 Epilepsy, unspecified, not intractable, without status epilepticus: Secondary | ICD-10-CM | POA: Insufficient documentation

## 2013-07-22 DIAGNOSIS — Z8719 Personal history of other diseases of the digestive system: Secondary | ICD-10-CM | POA: Insufficient documentation

## 2013-07-22 DIAGNOSIS — N83209 Unspecified ovarian cyst, unspecified side: Secondary | ICD-10-CM | POA: Insufficient documentation

## 2013-07-22 DIAGNOSIS — A599 Trichomoniasis, unspecified: Secondary | ICD-10-CM

## 2013-07-22 DIAGNOSIS — R112 Nausea with vomiting, unspecified: Secondary | ICD-10-CM | POA: Insufficient documentation

## 2013-07-22 DIAGNOSIS — Z87442 Personal history of urinary calculi: Secondary | ICD-10-CM | POA: Insufficient documentation

## 2013-07-22 DIAGNOSIS — A5900 Urogenital trichomoniasis, unspecified: Secondary | ICD-10-CM | POA: Insufficient documentation

## 2013-07-22 DIAGNOSIS — Z9089 Acquired absence of other organs: Secondary | ICD-10-CM | POA: Insufficient documentation

## 2013-07-22 HISTORY — DX: Disorder of kidney and ureter, unspecified: N28.9

## 2013-07-22 LAB — CBC WITH DIFFERENTIAL/PLATELET
Basophils Absolute: 0 10*3/uL (ref 0.0–0.1)
Basophils Relative: 0 % (ref 0–1)
EOS PCT: 3 % (ref 0–5)
Eosinophils Absolute: 0.1 10*3/uL (ref 0.0–0.7)
HEMATOCRIT: 39.4 % (ref 36.0–46.0)
Hemoglobin: 13.5 g/dL (ref 12.0–15.0)
LYMPHS ABS: 2 10*3/uL (ref 0.7–4.0)
LYMPHS PCT: 40 % (ref 12–46)
MCH: 32.8 pg (ref 26.0–34.0)
MCHC: 34.3 g/dL (ref 30.0–36.0)
MCV: 95.9 fL (ref 78.0–100.0)
Monocytes Absolute: 0.3 10*3/uL (ref 0.1–1.0)
Monocytes Relative: 6 % (ref 3–12)
NEUTROS ABS: 2.6 10*3/uL (ref 1.7–7.7)
Neutrophils Relative %: 51 % (ref 43–77)
PLATELETS: 237 10*3/uL (ref 150–400)
RBC: 4.11 MIL/uL (ref 3.87–5.11)
RDW: 13.6 % (ref 11.5–15.5)
WBC: 5.1 10*3/uL (ref 4.0–10.5)

## 2013-07-22 LAB — URINALYSIS, ROUTINE W REFLEX MICROSCOPIC
BILIRUBIN URINE: NEGATIVE
Glucose, UA: NEGATIVE mg/dL
HGB URINE DIPSTICK: NEGATIVE
KETONES UR: NEGATIVE mg/dL
Leukocytes, UA: NEGATIVE
NITRITE: NEGATIVE
PROTEIN: NEGATIVE mg/dL
SPECIFIC GRAVITY, URINE: 1.013 (ref 1.005–1.030)
UROBILINOGEN UA: 0.2 mg/dL (ref 0.0–1.0)
pH: 6.5 (ref 5.0–8.0)

## 2013-07-22 LAB — WET PREP, GENITAL: YEAST WET PREP: NONE SEEN

## 2013-07-22 LAB — COMPREHENSIVE METABOLIC PANEL
ALK PHOS: 106 U/L (ref 39–117)
ALT: 12 U/L (ref 0–35)
AST: 14 U/L (ref 0–37)
Albumin: 3.6 g/dL (ref 3.5–5.2)
BILIRUBIN TOTAL: 0.2 mg/dL — AB (ref 0.3–1.2)
BUN: 5 mg/dL — ABNORMAL LOW (ref 6–23)
CALCIUM: 9.5 mg/dL (ref 8.4–10.5)
CHLORIDE: 106 meq/L (ref 96–112)
CO2: 26 meq/L (ref 19–32)
Creatinine, Ser: 0.59 mg/dL (ref 0.50–1.10)
GLUCOSE: 67 mg/dL — AB (ref 70–99)
Potassium: 4.3 mEq/L (ref 3.7–5.3)
SODIUM: 145 meq/L (ref 137–147)
Total Protein: 7.4 g/dL (ref 6.0–8.3)

## 2013-07-22 MED ORDER — ONDANSETRON HCL 4 MG/2ML IJ SOLN
4.0000 mg | Freq: Once | INTRAMUSCULAR | Status: AC
  Administered 2013-07-22: 4 mg via INTRAVENOUS
  Filled 2013-07-22: qty 2

## 2013-07-22 MED ORDER — HYDROMORPHONE HCL PF 1 MG/ML IJ SOLN
1.0000 mg | Freq: Once | INTRAMUSCULAR | Status: AC
  Administered 2013-07-22: 1 mg via INTRAVENOUS
  Filled 2013-07-22: qty 1

## 2013-07-22 MED ORDER — METRONIDAZOLE 500 MG PO TABS
2000.0000 mg | ORAL_TABLET | Freq: Once | ORAL | Status: AC
  Administered 2013-07-22: 2000 mg via ORAL
  Filled 2013-07-22: qty 4

## 2013-07-22 MED ORDER — OXYCODONE-ACETAMINOPHEN 5-325 MG PO TABS: 1.0000 | ORAL_TABLET | Freq: Four times a day (QID) | ORAL | Status: DC | PRN

## 2013-07-22 MED ORDER — PROMETHAZINE HCL 25 MG PO TABS: 25.0000 mg | ORAL_TABLET | Freq: Four times a day (QID) | ORAL | Status: DC | PRN

## 2013-07-22 NOTE — ED Provider Notes (Signed)
CSN: [id number]     Arrival date & time 07/22/13  1156 History   First MD Initiated Contact with Patient 07/22/13 1323     Chief Complaint  Patient presents with  . Flank Pain   (Consider location/radiation/quality/duration/timing/severity/associated sxs/prior Treatment) HPI Comments: Patient is a 33 year old female who comes to the emergency department today with right-sided flank pain. She reports that this has been gradually worsening over the past 4 days. It is a sharp pain that starts in her back and radiates into her groin. The pain is worse with urination. This feels like her prior kidney stones. She had one episode of emesis last night. She has associated nausea today. She denies any fever, chills, vaginal discharge, vaginal bleeding.   Patient is a 33 y.o. female presenting with flank pain. The history is provided by the patient. No language interpreter was used.  Flank Pain Associated symptoms include nausea and vomiting. Pertinent negatives include no abdominal pain, chest pain, chills or fever.    Past Medical History  Diagnosis Date  . Seizures   . Bipolar 1 disorder   . Asthma   . Substance abuse   . Chronic back pain   . Sciatica   . Diverticulitis   . Renal disorder     kidney stones   Past Surgical History  Procedure Laterality Date  . Cholecystectomy    . Abdominal hysterectomy    . Abdominal surgery    . Dilation and curettage of uterus    . Colonoscopy N/A 11/24/2012    Procedure: COLONOSCOPY;  Surgeon: Malissa Hippo, MD;  Location: [facility];  Service: Endoscopy;  Laterality: N/A;   No family history on file. History  Substance Use Topics  . Smoking status: Current Every Day Smoker -- 1.00 packs/day    Types: Cigarettes  . Smokeless tobacco: Not on file  . Alcohol Use: 0.0 oz/week     Comment: occ   OB History   Grav Para Term Preterm Abortions TAB SAB Ect Mult Living                 Review of Systems  Constitutional: Negative for fever and  chills.  Respiratory: Negative for shortness of breath.   Cardiovascular: Negative for chest pain.  Gastrointestinal: Positive for nausea and vomiting. Negative for abdominal pain.  Genitourinary: Positive for dysuria, flank pain and pelvic pain. Negative for vaginal bleeding and vaginal discharge.  All other systems reviewed and are negative.    Allergies  Coconut flavor; Metronidazole; Penicillins; Propoxyphene n-acetaminophen; Rocephin; and Ultram  Home Medications   Current Outpatient Rx  Name  Route  Sig  Dispense  Refill  . albuterol (PROVENTIL HFA;VENTOLIN HFA) 108 (90 BASE) MCG/ACT inhaler   Inhalation   Inhale 2 puffs into the lungs every 6 (six) hours as needed. Shortness of breath         . carbamazepine (TEGRETOL) 100 MG chewable tablet   Oral   Chew 150 mg by mouth 3 (three) times daily.          Marland Kitchen gabapentin (NEURONTIN) 300 MG capsule   Oral   Take 300 mg by mouth 3 (three) times daily.            BP 103/75  Pulse 66  Temp(Src) 98.3 F (36.8 C) (Oral)  Resp 17  Ht 5\' 8"  (1.727 m)  Wt 150 lb (68.04 kg)  BMI 22.81 kg/m2  SpO2 96% Physical Exam  Nursing note and vitals reviewed. Constitutional: She is  oriented to person, place, and time. She appears well-developed and well-nourished. No distress.  HENT:  Head: Normocephalic and atraumatic.  Right Ear: External ear normal.  Left Ear: External ear normal.  Nose: Nose normal.  Mouth/Throat: Oropharynx is clear and moist.  Eyes: Conjunctivae are normal.  Neck: Normal range of motion.  Cardiovascular: Normal rate, regular rhythm and normal heart sounds.   Pulmonary/Chest: Effort normal and breath sounds normal. No stridor. No respiratory distress. She has no wheezes. She has no rales.  Abdominal: Soft. She exhibits no distension. There is tenderness in the right lower quadrant and suprapubic area. There is no rigidity, no rebound, no guarding, no CVA tenderness and no tenderness at McBurney's point.   Genitourinary: There is no rash, tenderness, lesion or injury on the right labia. There is no rash, tenderness, lesion or injury on the left labia. Cervix exhibits discharge (white). Cervix exhibits no motion tenderness and no friability. Right adnexum displays tenderness. Right adnexum displays no mass. Left adnexum displays no mass and no tenderness. No erythema, tenderness or bleeding around the vagina. No foreign body around the vagina. No signs of injury around the vagina. No vaginal discharge found.  Musculoskeletal: Normal range of motion.  Neurological: She is alert and oriented to person, place, and time. She has normal strength.  Skin: Skin is warm and dry. She is not diaphoretic. No erythema.  Psychiatric: She has a normal mood and affect. Her behavior is normal.    ED Course  Procedures (including critical care time) Labs Review Labs Reviewed  COMPREHENSIVE METABOLIC PANEL - Abnormal; Notable for the following:    Glucose, Bld 67 (*)    BUN 5 (*)    Total Bilirubin 0.2 (*)    All other components within normal limits  CBC WITH DIFFERENTIAL  URINALYSIS, ROUTINE W REFLEX MICROSCOPIC   Imaging Review Ct Abdomen Pelvis Wo Contrast  07/22/2013   CLINICAL DATA:  Right flank pain, hematuria, history of nephrolithiasis  EXAM: CT ABDOMEN AND PELVIS WITHOUT CONTRAST  TECHNIQUE: Multidetector CT imaging of the abdomen and pelvis was performed following the standard protocol without IV contrast.  COMPARISON:  11/23/2012  FINDINGS: Respiratory motion artifact through the lower chest. Lung bases clear. No lower lobe pneumonia. Normal heart size. No pericardial or pleural effusion.  Abdomen: Prior cholecystectomy noted. Kidneys demonstrate no acute obstruction, perinephric edema, hydronephrosis, or obstructing ureteral calculus on either side. No intrarenal calculi demonstrated.  Liver, biliary system, pancreas, spleen, and adrenal glands are within normal limits for noncontrast study.  Negative  for bowel obstruction, dilatation, ileus, or free air.  No abdominal free fluid, fluid collection, hemorrhage, abscess, or adenopathy.  Normal appendix demonstrated.  Pelvis: Minor sigmoid diverticulosis. No acute inflammation, fluid collection, hemorrhage, abscess, adenopathy, inguinal abnormality, or hernia. Urinary bladder unremarkable. Trace pelvic free fluid, likely physiologic. Left ovarian hypodense cyst noted measuring 24 x 20 mm, image 63. Patient is status post hysterectomy.  No acute osseous finding.  IMPRESSION: Negative for acute obstructing ureteral calculus, hydronephrosis, or acute obstructive uropathy  Prior cholecystectomy and hysterectomy  Normal appendix  2.4 cm right ovarian cyst  Minor sigmoid diverticulosis  No acute intra-abdominal or pelvic finding   Electronically Signed   By: Ruel Favors M.D.   On: 07/22/2013 15:06    EKG Interpretation   None       MDM  No diagnosis found. Pt presents to ED today with bilateral flank pain right worse than left. CT abdomen pelvis was done which shows 2.4cm  ovarian cyst. Pelvic exam done. Vaginal discharge with right adenexal tenderness. No CMT. No concern for PID. Given gradually worsening nature of sx for the past 4 days doubt torsion. Patient generally appears comfortable. Pt with hysterectomy. Pt is afebrile, nontoxic appearing. Doubt TOA. Currently wet prep is pending. Patient signed out to Dr. Freida BusmanAllen at change of shift. Follow up on wet prep and d/c with appropriate abx. Patient is hemodynamically stable and appropriate for discharge when wet prep results.   Mora BellmanHannah S Celita Aron, PA-C 07/22/13 [phone]

## 2013-07-22 NOTE — ED Notes (Signed)
Pt with bil flank pain x 3 days accompanied by difficulty urinating and some emesis last night.  Hx of kidney stones and pt states pain is the same.

## 2013-07-22 NOTE — ED Provider Notes (Signed)
Care assumed from PA Merrell at 1700  [name] presented with bilateral flank pain. CT and labs unremarkable. Pain controlled. Pelvic not c/w PID or cervicitis per previous provider. Wet prep shows trich. Flagyl is on her allergy list, I spoke to the patient and she is not sure of her allergy but thinks she may have had a rash. No SOB or facial swelling. Allergy does not appear to he immediate hypersensitivity, given one dose of Flagyl in the ED. Advised patient to wait for 30 minutes to evaluate for signs of allergy but she left shortly after receiving medication. Nurse advised patient to return for rash or trouble breathing. F/u with OB.   Veronica BilletMathias Leeon Makar, MD 07/22/13 [phone]

## 2013-07-22 NOTE — ED Notes (Signed)
Pt returned from CT °

## 2013-07-22 NOTE — ED Notes (Signed)
Patient transported to CT 

## 2013-07-22 NOTE — Discharge Instructions (Signed)
Ovarian Cyst °An ovarian cyst is a fluid-filled sac that forms on an ovary. The ovaries are small organs that produce eggs in women. Various types of cysts can form on the ovaries. Most are not cancerous. Many do not cause problems, and they often go away on their own. Some may cause symptoms and require treatment. Common types of ovarian cysts include: °· Functional cysts These cysts may occur every month during the menstrual cycle. This is normal. The cysts usually go away with the next menstrual cycle if the woman does not get pregnant. Usually, there are no symptoms with a functional cyst. °· Endometrioma cysts These cysts form from the tissue that lines the uterus. They are also called "chocolate cysts" because they become filled with blood that turns brown. This type of cyst can cause pain in the lower abdomen during intercourse and with your menstrual period. °· Cystadenoma cysts This type develops from the cells on the outside of the ovary. These cysts can get very big and cause lower abdomen pain and pain with intercourse. This type of cyst can twist on itself, cut off its blood supply, and cause severe pain. It can also easily rupture and cause a lot of pain. °· Dermoid cysts This type of cyst is sometimes found in both ovaries. These cysts may contain different kinds of body tissue, such as skin, teeth, hair, or cartilage. They usually do not cause symptoms unless they get very big. °· Theca lutein cysts These cysts occur when too much of a certain hormone (human chorionic gonadotropin) is produced and overstimulates the ovaries to produce an egg. This is most common after procedures used to assist with the conception of a baby (in vitro fertilization). °CAUSES  °· Fertility drugs can cause a condition in which multiple large cysts are formed on the ovaries. This is called ovarian hyperstimulation syndrome. °· A condition called polycystic ovary syndrome can cause hormonal imbalances that can lead to  nonfunctional ovarian cysts. °SIGNS AND SYMPTOMS  °Many ovarian cysts do not cause symptoms. If symptoms are present, they may include: °· Pelvic pain or pressure. °· Pain in the lower abdomen. °· Pain during sexual intercourse. °· Increasing girth (swelling) of the abdomen. °· Abnormal menstrual periods. °· Increasing pain with menstrual periods. °· Stopping having menstrual periods without being pregnant. °DIAGNOSIS  °These cysts are commonly found during a routine or annual pelvic exam. Tests may be ordered to find out more about the cyst. These tests may include: °· Ultrasound. °· X-ray of the pelvis. °· CT scan. °· MRI. °· Blood tests. °TREATMENT  °Many ovarian cysts go away on their own without treatment. Your health care provider may want to check your cyst regularly for 2 3 months to see if it changes. For women in menopause, it is particularly important to monitor a cyst closely because of the higher rate of ovarian cancer in menopausal women. When treatment is needed, it may include any of the following: °· A procedure to drain the cyst (aspiration). This may be done using a long needle and ultrasound. It can also be done through a laparoscopic procedure. This involves using a thin, lighted tube with a tiny camera on the end (laparoscope) inserted through a small incision. °· Surgery to remove the whole cyst. This may be done using laparoscopic surgery or an open surgery involving a larger incision in the lower abdomen. °· Hormone treatment or birth control pills. These methods are sometimes used to help dissolve a cyst. °HOME CARE   INSTRUCTIONS   Only take over-the-counter or prescription medicines as directed by your health care provider.  Follow up with your health care provider as directed.  Get regular pelvic exams and Pap tests. SEEK MEDICAL CARE IF:   Your periods are late, irregular, or painful, or they stop.  Your pelvic pain or abdominal pain does not go away.  Your abdomen becomes  larger or swollen.  You have pressure on your bladder or trouble emptying your bladder completely.  You have pain during sexual intercourse.  You have feelings of fullness, pressure, or discomfort in your stomach.  You lose weight for no apparent reason.  You feel generally ill.  You become constipated.  You lose your appetite.  You develop acne.  You have an increase in body and facial hair.  You are gaining weight, without changing your exercise and eating habits.  You think you are pregnant. SEEK IMMEDIATE MEDICAL CARE IF:   You have increasing abdominal pain.  You feel sick to your stomach (nauseous), and you throw up (vomit).  You develop a fever that comes on suddenly.  You have abdominal pain during a bowel movement.  Your menstrual periods become heavier than usual. Document Released: 06/16/2005 Document Revised: 04/06/2013 Document Reviewed: 02/21/2013 [facility] Patient Information 2014 [place], [place].   Emergency Department Resource Guide 1) Find a Doctor and Pay Out of Pocket Although you won't have to find out who is covered by your insurance plan, it is a good idea to ask around and get recommendations. You will then need to call the office and see if the doctor you have chosen will accept you as a new patient and what types of options they offer for patients who are self-pay. Some doctors offer discounts or will set up payment plans for their patients who do not have insurance, but you will need to ask so you aren't surprised when you get to your appointment.  2) Contact Your Local Health Department Not all health departments have doctors that can see patients for sick visits, but many do, so it is worth a call to see if yours does. If you don't know where your local health department is, you can check in your phone book. The CDC also has a tool to help you locate your state's health department, and many state websites also have listings of all of their local  health departments.  3) Find a Walk-in Clinic If your illness is not likely to be very severe or complicated, you may want to try a walk in clinic. These are popping up all over the country in pharmacies, drugstores, and shopping centers. They're usually staffed by nurse practitioners or physician assistants that have been trained to treat common illnesses and complaints. They're usually fairly quick and inexpensive. However, if you have serious medical issues or chronic medical problems, these are probably not your best option.  No Primary Care Doctor: - Call Health Connect at  [phone] - they can help you locate a primary care doctor that  accepts your insurance, provides certain services, etc. - Physician Referral Service- [phone]  Chronic Pain Problems: Organization         Address  Phone   Notes  [facility]  [phone] Patients need to be referred by their primary care doctor.   Medication Assistance: Organization         Address  Phone   Notes  [facility] Medication Assistance Program [address] [place],  [place] [id number]) [phone] --Must be a resident of [place] -- Must have NO insurance coverage whatsoever (no Medicaid/ Medicare, etc.) -- The pt. MUST have a primary care doctor that directs their care regularly and follows them in the community   MedAssist  [phone]   [employer]  [phone]    Agencies that provide inexpensive medical care: Organization         Address  Phone   Notes  [facility]  [phone]) [phone]   [facility]    [phone]   [facility] [address] [place], [place] [address] [phone]) [phone]   [facility] [place] [address], [place] [phone]) [phone]   Planned Parenthood    [phone]) [phone]   [facility]    [phone]) [phone]   [facility] and [facility]  [address], [place] Phone:   [phone]) [phone], Fax:  [phone]) [phone] Hours of Operation:  9 am - 6 pm, M-F.  Also accepts Medicaid/Medicare and self-pay.  [facility]  [address], [place] Phone: [phone], Fax: [phone]) [phone]. Hours of Operation:  8:30 am - 5:30 pm, M-F.  Also accepts Medicaid and self-pay.  [facility] [place] [address], [place] Phone: [phone](336) [phone]   [facility] [address], [place], [place] [phone], Ext. [phone] Mondays & Thursdays: 7-9 AM.  First 15 patients are seen on a first come, first serve basis.    Medicaid-accepting [facility] Providers:  Organization         Address  Phone   Notes  [facility] [address], [place] [phone]) [phone] Also accepts self-pay patients.  [facility] [address] [place], [place]  [phone]) [phone]   [facility] [address], [place] [phone]) [phone]   [facility] [address] [place] [address], [place] [phone]) [phone]   Renaye RakersVeita Bland [address], [place]   [phone] Only accepts [place] Access [place] patients after they have their name applied to their card.   Self-Pay (no insurance) in [facility] [place]:  Organization         Address  Phone   Notes  Sickle Cell Patients, [facility] [address], [place] [phone]) [phone]   [facility] [address], [place] [phone]   [facility] [place]  [address], [place] [phone]   [facility]/Dr. Osei-Bonsu  [address] [place] [address], [place] or [phone], [place] [phone] Phone number for both [place] and [place] locations is the same.  [facility]gent [facility] and [facility] [address], [place] [phone]) [phone]   [facility] [place] [address] [place] [address], [place] or [address] [phone])  [phone](336) [phone]   [facility] [address], [place] [phone]) [phone], phone; [phone]) [phone], fax Sees patients 1st and 3rd Saturday of every month.  Must not qualify for public or private insurance (i.e. Medicaid, Medicare, [place] Health Choice, Veterans' Benefits)  Household income should be no more than 200% of the poverty level The clinic cannot treat you if you are pregnant or think you are pregnant  Sexually transmitted diseases are not treated at the clinic.    Dental Care: Organization  Address  Phone  Notes  [facility] [place], [place] [phone] Accepts children up to age 78 who are enrolled in [place] or [place]; pregnant women with a Medicaid card; and children who have applied for Medicaid or [place] Health Choice, but were declined, whose parents can pay a reduced fee at time of service.  [facility]  [address], [place] [phone] Accepts children up to age 27 who are enrolled in [place] or [place]; pregnant women with a Medicaid card; and children who have applied for Medicaid or [place] Health Choice, but were declined, whose parents can pay a reduced fee at time of service.  [place] Adult [facility] Access PROGRAM  [place] [phone] Patients are seen by appointment only. Walk-ins are not accepted. [place] will see patients 41 years of age and older. Monday - Tuesday (8am-5pm) Most Wednesdays (8:30-5pm) $30 per visit, cash only  [facility] Adult [facility] Access PROGRAM  [address], [facility] [phone] Patients are seen by appointment only. Walk-ins are not accepted. [place] will see patients 58 years of age and older. One Wednesday Evening (Monthly: Volunteer Based).  $30 per visit, cash only  [place]  [phone] for adults;  Children under age 3, call Graduate Pediatric Dentistry at [phone]. Children aged 20-14, please call [phone] to request a pediatric application.  Dental services are provided in all areas of dental care including fillings, crowns and bridges, complete and partial dentures, implants, gum treatment, root canals, and extractions. Preventive care is also provided. Treatment is provided to both adults and children. Patients are selected via a lottery and there is often a waiting list.   [facility] [address], [place]  [phone] [email or web address]   [facility] [address] [place], [place] [phone], Ext. [phone] Second and Fourth Thursday of each month, opens at 6:30 AM; Clinic ends at 9 AM.  Patients are seen on a first-come first-served basis, and a limited number are seen during each clinic.   [facility]  [address] Hillard Danker [place], [place] [phone]   Eligibility Requirements You must have lived in [place], [place], or [place] counties for at least the last three months.   You cannot be eligible for state or federal sponsored [employer], including [employer], [place], or [employer].   You generally cannot be eligible for healthcare insurance through your employer.    How to apply: Eligibility screenings are held every Tuesday and Wednesday afternoon from 1:00 pm until 4:00 pm. You do not need an appointment for the interview!  [facility] [address] [place], [place]   [place]  [place] Department  Lupus  [phone]    Behavioral Health Resources in the Community: Intensive Outpatient Programs Organization         Address  Phone  Notes  [facility]. [address] [place], [place] [phone]   [facility] [address], [place], [place]   ADS: [facility] [address], [place], [place]   [place] [address]  [place], [place] or [phone]   Substance Abuse Resources  Organization         Address  Phone  Notes  Alcohol and Drug Services  [phone]   [facility]  [phone]   [place]  [phone]   Floydene Flock  [phone]   Residential & Outpatient Substance Abuse Program  [phone]   Psychological Services Organization         Address  Phone  Notes  [facility]  [phone]   [facility]  [phone]   [facility] Mental Health [address] [place] [phone] or [phone]    Mobile Crisis Teams Organization         Address  Phone  Notes  [facility], Mobile Crisis Care Unit  [phone]   [facility]  [address] [place], [place] [phone]   Doristine Locks [address] [place] [phone]    Self-Help/Support Groups Organization         Address  Phone             Notes  [facility] [place] - variety of support groups  [phone]- I7437963 Call for more information  Narcotics Anonymous (NA), [facility]aring [facility] [address], [employer] [place]  2 meetings at this location   Statistician         Address  Phone  Notes  [facility] [address],    [place]  [phone]   [facility]  [address], [place] [address], [place], [place] [phone]   [facility] [address] [place], [place] [phone] Admissions: 8am-3pm M-F  [facility] [address],    [place], [place] [phone]   [facility] [address] [place], [place], [place] [phone]   [facility] [address]  [place], [place] [phone]   [facility] - Intensive  Outpatient [address], [place], [place] [phone]   [facility] (Addiction Recovery [facility] [address] [place].,  [place], [place] [phone] or [phone]   [facility] ([facility]) [address], [place], [place] [phone] Accepts Medicaid  [facility] [address],  [place] [phone] Substance Abuse/Addiction Treatment   [facility] Organization         Address  Phone  Notes  [facility]  [phone]   Angie Fava, PhD [address] [place], [place]   [phone] or [phone]   [facility]al   [address] [place], [place] [phone]   [facility] [address], [place], [place] [phone] Insurance/Medicaid/sponsorship through [facility] [address]                                    [place], [place] [phone] Therapy/tele-psych/case  [facility] [address], [place] [phone]    Dr. Lolly Mustache  [phone]   [facility]  [employer] [facility] Dept. 1) [address] [place] 2) [address], [place] 3)  [address], [place] [phone]  [phone]   [facility] Child Abuse Hotline 216-418-1320 or [phone] (After Hours)

## 2013-07-23 NOTE — ED Provider Notes (Signed)
Medical screening examination/treatment/procedure(s) were performed by non-physician practitioner and as supervising physician I was immediately available for consultation/collaboration.  EKG Interpretation   None         Audree CamelScott T Calee Nugent, MD 07/23/13 1140

## 2013-07-24 ENCOUNTER — Emergency Department (HOSPITAL_COMMUNITY)
Admission: EM | Admit: 2013-07-24 | Discharge: 2013-07-24 | Disposition: A | Discharge status: Home / Self Care | Payer: Medicaid Other | Attending: Emergency Medicine | Admitting: Emergency Medicine

## 2013-07-24 ENCOUNTER — Encounter (HOSPITAL_COMMUNITY): Payer: Self-pay | Admitting: Emergency Medicine

## 2013-07-24 ENCOUNTER — Emergency Department (HOSPITAL_COMMUNITY): Payer: Medicaid Other

## 2013-07-24 DIAGNOSIS — Z880 Allergy status to penicillin: Secondary | ICD-10-CM | POA: Insufficient documentation

## 2013-07-24 DIAGNOSIS — G8929 Other chronic pain: Secondary | ICD-10-CM | POA: Insufficient documentation

## 2013-07-24 DIAGNOSIS — K5732 Diverticulitis of large intestine without perforation or abscess without bleeding: Secondary | ICD-10-CM | POA: Insufficient documentation

## 2013-07-24 DIAGNOSIS — N83209 Unspecified ovarian cyst, unspecified side: Secondary | ICD-10-CM | POA: Insufficient documentation

## 2013-07-24 DIAGNOSIS — Z881 Allergy status to other antibiotic agents status: Secondary | ICD-10-CM | POA: Insufficient documentation

## 2013-07-24 DIAGNOSIS — N289 Disorder of kidney and ureter, unspecified: Secondary | ICD-10-CM | POA: Insufficient documentation

## 2013-07-24 DIAGNOSIS — F319 Bipolar disorder, unspecified: Secondary | ICD-10-CM | POA: Insufficient documentation

## 2013-07-24 DIAGNOSIS — F17200 Nicotine dependence, unspecified, uncomplicated: Secondary | ICD-10-CM | POA: Insufficient documentation

## 2013-07-24 DIAGNOSIS — Z886 Allergy status to analgesic agent status: Secondary | ICD-10-CM | POA: Insufficient documentation

## 2013-07-24 DIAGNOSIS — F1910 Other psychoactive substance abuse, uncomplicated: Secondary | ICD-10-CM | POA: Insufficient documentation

## 2013-07-24 DIAGNOSIS — Z79899 Other long term (current) drug therapy: Secondary | ICD-10-CM | POA: Insufficient documentation

## 2013-07-24 DIAGNOSIS — M5430 Sciatica, unspecified side: Secondary | ICD-10-CM | POA: Insufficient documentation

## 2013-07-24 DIAGNOSIS — R112 Nausea with vomiting, unspecified: Secondary | ICD-10-CM | POA: Insufficient documentation

## 2013-07-24 DIAGNOSIS — G40909 Epilepsy, unspecified, not intractable, without status epilepticus: Secondary | ICD-10-CM | POA: Insufficient documentation

## 2013-07-24 DIAGNOSIS — J45909 Unspecified asthma, uncomplicated: Secondary | ICD-10-CM | POA: Insufficient documentation

## 2013-07-24 LAB — URINALYSIS, ROUTINE W REFLEX MICROSCOPIC
Bilirubin Urine: NEGATIVE
Glucose, UA: NEGATIVE mg/dL
Hgb urine dipstick: NEGATIVE
Ketones, ur: NEGATIVE mg/dL
Leukocytes, UA: NEGATIVE
NITRITE: NEGATIVE
Protein, ur: NEGATIVE mg/dL
Specific Gravity, Urine: 1.009 (ref 1.005–1.030)
UROBILINOGEN UA: 0.2 mg/dL (ref 0.0–1.0)
pH: 7.5 (ref 5.0–8.0)

## 2013-07-24 LAB — GC/CHLAMYDIA PROBE AMP
CT PROBE, AMP APTIMA: NEGATIVE
GC PROBE AMP APTIMA: NEGATIVE

## 2013-07-24 LAB — WET PREP, GENITAL
Trich, Wet Prep: NONE SEEN
YEAST WET PREP: NONE SEEN

## 2013-07-24 LAB — POCT PREGNANCY, URINE: Preg Test, Ur: NEGATIVE

## 2013-07-24 MED ORDER — HYDROMORPHONE HCL PF 1 MG/ML IJ SOLN
1.0000 mg | Freq: Once | INTRAMUSCULAR | Status: AC
  Administered 2013-07-24: 1 mg via INTRAMUSCULAR
  Filled 2013-07-24: qty 1

## 2013-07-24 MED ORDER — ONDANSETRON 4 MG PO TBDP
4.0000 mg | ORAL_TABLET | Freq: Once | ORAL | Status: AC
  Administered 2013-07-24: 4 mg via ORAL
  Filled 2013-07-24: qty 1

## 2013-07-24 MED ORDER — HYDROCODONE-ACETAMINOPHEN 5-325 MG PO TABS: 1.0000 | ORAL_TABLET | Freq: Four times a day (QID) | ORAL | Status: DC | PRN

## 2013-07-24 NOTE — ED Notes (Signed)
Pt. Stated, i was here on Friday and dx with an ovarian cyst and told me to come back if worse and Im worse.  I can't call until tomorrow to Dr. Emelda FearFerguson cause he will not be open.

## 2013-07-24 NOTE — ED Provider Notes (Signed)
CSN: [id number]     Arrival date & time 07/24/13  1400 History   First MD Initiated Contact with Patient 07/24/13 1449     Chief Complaint  Patient presents with  . Abdominal Pain   (Consider location/radiation/quality/duration/timing/severity/associated sxs/prior Treatment) Patient is a 33 y.o. female presenting with abdominal pain. The history is provided by the patient.  Abdominal Pain Pain location:  RLQ Pain quality: sharp   Pain radiates to:  Does not radiate Pain severity:  Moderate Onset quality:  Gradual Timing:  Constant Progression:  Worsening Chronicity:  New Context: recent illness (recently diagnosed with trichamonas and ovarian cyst)   Context: not eating, not sick contacts and not trauma   Relieved by:  Nothing Associated symptoms: nausea and vomiting   Associated symptoms: no cough, no fever and no shortness of breath     Past Medical History  Diagnosis Date  . Seizures   . Bipolar 1 disorder   . Asthma   . Substance abuse   . Chronic back pain   . Sciatica   . Diverticulitis   . Renal disorder     kidney stones   Past Surgical History  Procedure Laterality Date  . Cholecystectomy    . Abdominal hysterectomy    . Abdominal surgery    . Dilation and curettage of uterus    . Colonoscopy N/A 11/24/2012    Procedure: COLONOSCOPY;  Surgeon: Malissa Hippo, MD;  Location: [facility];  Service: Endoscopy;  Laterality: N/A;   No family history on file. History  Substance Use Topics  . Smoking status: Current Every Day Smoker -- 1.00 packs/day    Types: Cigarettes  . Smokeless tobacco: Not on file  . Alcohol Use: 0.0 oz/week     Comment: occ   OB History   Grav Para Term Preterm Abortions TAB SAB Ect Mult Living                 Review of Systems  Constitutional: Negative for fever.  Respiratory: Negative for cough and shortness of breath.   Gastrointestinal: Positive for nausea and vomiting. Negative for abdominal pain.  All other systems  reviewed and are negative.    Allergies  Coconut flavor; Metronidazole; Penicillins; Propoxyphene n-acetaminophen; Rocephin; and Ultram  Home Medications   Current Outpatient Rx  Name  Route  Sig  Dispense  Refill  . albuterol (PROVENTIL HFA;VENTOLIN HFA) 108 (90 BASE) MCG/ACT inhaler   Inhalation   Inhale 2 puffs into the lungs every 6 (six) hours as needed. Shortness of breath         . carbamazepine (TEGRETOL) 100 MG chewable tablet   Oral   Chew 150 mg by mouth 3 (three) times daily.          Marland Kitchen gabapentin (NEURONTIN) 300 MG capsule   Oral   Take 300 mg by mouth 3 (three) times daily.           Marland Kitchen oxyCODONE-acetaminophen (PERCOCET/ROXICET) 5-325 MG per tablet   Oral   Take 1-2 tablets by mouth every 6 (six) hours as needed for severe pain.   12 tablet   0   . promethazine (PHENERGAN) 25 MG tablet   Oral   Take 1 tablet (25 mg total) by mouth every 6 (six) hours as needed for nausea or vomiting.   12 tablet   0    BP 130/71  Pulse 84  Temp(Src) 98.2 F (36.8 C) (Oral)  Resp 20  Ht 5\' 8"  (1.727 m)  Wt 150 lb (68.04 kg)  BMI 22.81 kg/m2  SpO2 98% Physical Exam  Nursing note and vitals reviewed. Constitutional: She is oriented to person, place, and time. She appears well-developed and well-nourished. No distress.  HENT:  Head: Normocephalic and atraumatic.  Eyes: EOM are normal. Pupils are equal, round, and reactive to light.  Neck: Normal range of motion. Neck supple.  Cardiovascular: Normal rate and regular rhythm.  Exam reveals no friction rub.   No murmur heard. Pulmonary/Chest: Effort normal and breath sounds normal. No respiratory distress. She has no wheezes. She has no rales.  Abdominal: Soft. She exhibits no distension. There is tenderness (RLQ, low in pelvis). There is no rebound.  Musculoskeletal: Normal range of motion. She exhibits no edema.  Neurological: She is alert and oriented to person, place, and time.  Skin: She is not diaphoretic.     ED Course  Procedures (including critical care time) Labs Review Labs Reviewed  GC/CHLAMYDIA PROBE AMP  WET PREP, GENITAL  URINALYSIS, ROUTINE W REFLEX MICROSCOPIC   Imaging Review [place] Pelvis Complete  07/24/2013   CLINICAL DATA:  Pelvic pain, right ovarian/adnexal cyst. Prior hysterectomy and patient reports prior left oophorectomy.  EXAM: TRANSABDOMINAL AND TRANSVAGINAL ULTRASOUND OF PELVIS  DOPPLER ULTRASOUND OF OVARIES  TECHNIQUE: Both transabdominal and transvaginal ultrasound examinations of the pelvis were performed. Transabdominal technique was performed for global imaging of the pelvis including uterus, ovaries, adnexal regions, and pelvic cul-de-sac.  It was necessary to proceed with endovaginal exam following the transabdominal exam to visualize the right ovary. Color and duplex Doppler ultrasound was utilized to evaluate blood flow to the ovaries.  COMPARISON:  CT 07/22/2013  FINDINGS: Uterus  Measurements: Surgically absent. No midline mass.  Endometrium  Thickness: Surgically absent.  Right ovary  Measurements: 4.3 x 3.3 x 3.0 cm. Multiple anechoic simple appearing follicles are incidentally noted. Surgically absent  Left ovary  Measurements: No left adnexal mass. Normal appearance/no adnexal mass.  Pulsed Doppler evaluation of both ovaries demonstrates normal low-resistance arterial and venous waveforms in the right ovary.  Other findings  No free fluid.  IMPRESSION: Right ovarian follicles are incidentally noted. No sonographic evidence for ovarian torsion involving the visualized right ovary. Patient reports prior left oophorectomy. No further followup is needed for these benign appearing imaging findings.   Electronically Signed   By: Christiana PellantGretchen  Green M.D.   On: 07/24/2013 17:54   [place] Art/ven Flow Abd Pelv Doppler  07/24/2013   CLINICAL DATA:  Pelvic pain, right ovarian/adnexal cyst. Prior hysterectomy and patient reports prior left oophorectomy.  EXAM: TRANSABDOMINAL AND  TRANSVAGINAL ULTRASOUND OF PELVIS  DOPPLER ULTRASOUND OF OVARIES  TECHNIQUE: Both transabdominal and transvaginal ultrasound examinations of the pelvis were performed. Transabdominal technique was performed for global imaging of the pelvis including uterus, ovaries, adnexal regions, and pelvic cul-de-sac.  It was necessary to proceed with endovaginal exam following the transabdominal exam to visualize the right ovary. Color and duplex Doppler ultrasound was utilized to evaluate blood flow to the ovaries.  COMPARISON:  CT 07/22/2013  FINDINGS: Uterus  Measurements: Surgically absent. No midline mass.  Endometrium  Thickness: Surgically absent.  Right ovary  Measurements: 4.3 x 3.3 x 3.0 cm. Multiple anechoic simple appearing follicles are incidentally noted. Surgically absent  Left ovary  Measurements: No left adnexal mass. Normal appearance/no adnexal mass.  Pulsed Doppler evaluation of both ovaries demonstrates normal low-resistance arterial and venous waveforms in the right ovary.  Other findings  No free fluid.  IMPRESSION: Right ovarian follicles  are incidentally noted. No sonographic evidence for ovarian torsion involving the visualized right ovary. Patient reports prior left oophorectomy. No further followup is needed for these benign appearing imaging findings.   Electronically Signed   By: Christiana Pellant M.D.   On: 07/24/2013 17:54    EKG Interpretation   None       MDM   1. Ovarian cyst    33 year old female presents with abdominal pain. Diagnosed one week ago with ovarian cysts and Trichomonas. Pain.worse overnight. He has been intermittent but now is constant. She is also out of her pain meds. Denies any fevers. Has had associated vomiting. No diarrhea. No vaginal discharge. On exam his right lower quadrant tenderness. Her CT scan for similar pain as negative for appendicitis. We'll do pelvic exam and likely ultrasound her since she has no ovarian cyst so we can rule out ovarian torsion.  Pelvic with moderate R sided tenderness. Trich resolved on wet prep. GC/Ch negative from last week. Patient has no evidence of torsion on [place]. Likely ovarian cyst pain. Patient stable for discharge.   Dagmar Hait, MD 07/24/13 [phone]

## 2013-07-24 NOTE — Discharge Instructions (Signed)

## 2013-07-24 NOTE — ED Notes (Signed)
Pt at ultrasound

## 2013-07-25 LAB — GC/CHLAMYDIA PROBE AMP
CT PROBE, AMP APTIMA: NEGATIVE
GC Probe RNA: NEGATIVE

## 2013-07-28 ENCOUNTER — Ambulatory Visit: Payer: Self-pay | Admitting: Obstetrics and Gynecology

## 2013-08-01 ENCOUNTER — Encounter: Payer: Self-pay | Admitting: Obstetrics and Gynecology

## 2013-08-01 ENCOUNTER — Ambulatory Visit (INDEPENDENT_AMBULATORY_CARE_PROVIDER_SITE_OTHER): Payer: Medicaid Other | Admitting: Obstetrics and Gynecology

## 2013-08-01 VITALS — BP 120/58 | Wt 146.4 lb

## 2013-08-01 DIAGNOSIS — N83209 Unspecified ovarian cyst, unspecified side: Secondary | ICD-10-CM

## 2013-08-01 DIAGNOSIS — Z9189 Other specified personal risk factors, not elsewhere classified: Secondary | ICD-10-CM

## 2013-08-01 DIAGNOSIS — G8929 Other chronic pain: Secondary | ICD-10-CM

## 2013-08-01 DIAGNOSIS — N949 Unspecified condition associated with female genital organs and menstrual cycle: Secondary | ICD-10-CM

## 2013-08-01 DIAGNOSIS — R102 Pelvic and perineal pain: Principal | ICD-10-CM

## 2013-08-01 MED ORDER — HYDROCODONE-ACETAMINOPHEN 5-325 MG PO TABS: 0.5000 | ORAL_TABLET | Freq: Four times a day (QID) | ORAL | Status: DC | PRN

## 2013-08-01 NOTE — Progress Notes (Signed)
This chart was transcribed for Dr. Christin BachJohn Adore Kithcart by Leone PayorSonum Patel, ED scribe.    [facility] Visit  Patient name: Veronica ChuteSamantha L Mcgregory MRN [id number]  Date of birth: July 02, 1980  CC & HPI:  Veronica ChuteSamantha L [name] is a 33 y.o. female presenting today for abdominal pain, nausea, and urinary urgency. She was seen in the ED about 1 week ago and had an u/s. She reports last BM was 1 week ago. She OTC stool softener QD and Bentyl BID. She also reports dyspareunia, worse on right side and with certain movements/positions.  See u/s report from  1/25 Right ovary  Measurements: 4.3 x 3.3 x 3.0 cm. Multiple anechoic simple appearing  follicles are incidentally noted.  Surgically absent uterus and left ovary.    History of abdominal hysterectomy and left oophorectomy. For dyspareunia, left ovarian pain  ROS:  Constipation   Pertinent History Reviewed:  Medical & Surgical Hx:  Reviewed: Significant for hx opiate dependency resulting in loss of child custody, now has a 4 children again. In relation ship iwith FOB of oldest child.  Past Medical History  Diagnosis Date  . Seizures   . Bipolar 1 disorder   . Asthma   . Substance abuse   . Chronic back pain   . Sciatica   . Diverticulitis   . Renal disorder     kidney stones    Past Surgical History  Procedure Laterality Date  . Cholecystectomy    . Abdominal hysterectomy    . Abdominal surgery    . Dilation and curettage of uterus    . Colonoscopy N/A 11/24/2012    Procedure: COLONOSCOPY;  Surgeon: Malissa HippoNajeeb U Rehman, MD;  Location: [facility];  Service: Endoscopy;  Laterality: N/A;    Medications: Reviewed & Updated - see associated section Social History: Reviewed -  reports that she has been smoking Cigarettes.  She has been smoking about 1.00 pack per day. She does not have any smokeless tobacco history on file.  Objective Findings:  Vitals: BP 120/58  Wt 146 lb 6.4 oz (66.407 kg)  Physical Examination: General appearance -  alert, well appearing, and in no distress and oriented to person, place, and time Pelvic - VULVA: normal appearing vulva with no masses, tenderness or lesions,  VAGINA: normal appearing vagina with normal color and discharge, no lesions, good vaginal support, CERVIX: surgically absent, UTERUS: surgically absent, vaginal cuff well healed, ADNEXA: normal adnexa in size, nontender and no masses  Assessment & Plan:   A:hx chronic pain, pelvis    Hx opiate abuse    Recurrent pelvic discomfort/pain  1. Plan SUppress ovary c ocp. Sprintec x 3 months  2. Limited use of Hydrocodone 1 daily in divided dosing  Pt does well on one 5 mg tab daily.

## 2013-08-01 NOTE — Patient Instructions (Signed)
Beware of overuse of opiates due to your history Alternate motrin Begin oral contraceptives to try to suppress the ovaries.

## 2013-08-06 ENCOUNTER — Encounter (HOSPITAL_COMMUNITY): Payer: Self-pay | Admitting: Emergency Medicine

## 2013-08-06 ENCOUNTER — Emergency Department (HOSPITAL_COMMUNITY)
Admission: EM | Admit: 2013-08-06 | Discharge: 2013-08-06 | Disposition: A | Discharge status: Home / Self Care | Payer: Medicaid Other | Attending: Emergency Medicine | Admitting: Emergency Medicine

## 2013-08-06 DIAGNOSIS — F319 Bipolar disorder, unspecified: Secondary | ICD-10-CM | POA: Insufficient documentation

## 2013-08-06 DIAGNOSIS — R1031 Right lower quadrant pain: Secondary | ICD-10-CM | POA: Insufficient documentation

## 2013-08-06 DIAGNOSIS — G40802 Other epilepsy, not intractable, without status epilepticus: Secondary | ICD-10-CM | POA: Insufficient documentation

## 2013-08-06 DIAGNOSIS — Z79899 Other long term (current) drug therapy: Secondary | ICD-10-CM | POA: Insufficient documentation

## 2013-08-06 DIAGNOSIS — Z889 Allergy status to unspecified drugs, medicaments and biological substances status: Secondary | ICD-10-CM | POA: Insufficient documentation

## 2013-08-06 DIAGNOSIS — M549 Dorsalgia, unspecified: Secondary | ICD-10-CM | POA: Insufficient documentation

## 2013-08-06 DIAGNOSIS — F17200 Nicotine dependence, unspecified, uncomplicated: Secondary | ICD-10-CM | POA: Insufficient documentation

## 2013-08-06 DIAGNOSIS — F1910 Other psychoactive substance abuse, uncomplicated: Secondary | ICD-10-CM | POA: Insufficient documentation

## 2013-08-06 DIAGNOSIS — N83209 Unspecified ovarian cyst, unspecified side: Secondary | ICD-10-CM | POA: Insufficient documentation

## 2013-08-06 DIAGNOSIS — Z881 Allergy status to other antibiotic agents status: Secondary | ICD-10-CM | POA: Insufficient documentation

## 2013-08-06 DIAGNOSIS — R109 Unspecified abdominal pain: Secondary | ICD-10-CM | POA: Insufficient documentation

## 2013-08-06 DIAGNOSIS — M5430 Sciatica, unspecified side: Secondary | ICD-10-CM | POA: Insufficient documentation

## 2013-08-06 DIAGNOSIS — N289 Disorder of kidney and ureter, unspecified: Secondary | ICD-10-CM | POA: Insufficient documentation

## 2013-08-06 DIAGNOSIS — J45909 Unspecified asthma, uncomplicated: Secondary | ICD-10-CM | POA: Insufficient documentation

## 2013-08-06 DIAGNOSIS — Z880 Allergy status to penicillin: Secondary | ICD-10-CM | POA: Insufficient documentation

## 2013-08-06 DIAGNOSIS — Z886 Allergy status to analgesic agent status: Secondary | ICD-10-CM | POA: Insufficient documentation

## 2013-08-06 DIAGNOSIS — K5732 Diverticulitis of large intestine without perforation or abscess without bleeding: Secondary | ICD-10-CM | POA: Insufficient documentation

## 2013-08-06 LAB — COMPREHENSIVE METABOLIC PANEL
ALK PHOS: 89 U/L (ref 39–117)
ALT: 9 U/L (ref 0–35)
AST: 14 U/L (ref 0–37)
Albumin: 3.9 g/dL (ref 3.5–5.2)
BUN: 5 mg/dL — AB (ref 6–23)
CO2: 22 mEq/L (ref 19–32)
CREATININE: 0.68 mg/dL (ref 0.50–1.10)
Calcium: 9.3 mg/dL (ref 8.4–10.5)
Chloride: 101 mEq/L (ref 96–112)
GFR calc Af Amer: >90 mL/min (ref >90)
GFR calc non Af Amer: >90 mL/min (ref >90)
GLUCOSE: 106 mg/dL — AB (ref 70–99)
Potassium: 3.6 mEq/L — ABNORMAL LOW (ref 3.7–5.3)
Sodium: 138 mEq/L (ref 137–147)
TOTAL PROTEIN: 7.3 g/dL (ref 6.0–8.3)
Total Bilirubin: 0.3 mg/dL (ref 0.3–1.2)

## 2013-08-06 LAB — URINALYSIS, ROUTINE W REFLEX MICROSCOPIC
Bilirubin Urine: NEGATIVE
Glucose, UA: NEGATIVE mg/dL
Hgb urine dipstick: NEGATIVE
Ketones, ur: NEGATIVE mg/dL
LEUKOCYTES UA: NEGATIVE
NITRITE: NEGATIVE
PH: 6.5 (ref 5.0–8.0)
Protein, ur: NEGATIVE mg/dL
Specific Gravity, Urine: 1.005 (ref 1.005–1.030)
Urobilinogen, UA: 0.2 mg/dL (ref 0.0–1.0)

## 2013-08-06 LAB — CBC WITH DIFFERENTIAL/PLATELET
Basophils Absolute: 0 10*3/uL (ref 0.0–0.1)
Basophils Relative: 0 % (ref 0–1)
Eosinophils Absolute: 0.2 10*3/uL (ref 0.0–0.7)
Eosinophils Relative: 2 % (ref 0–5)
HEMATOCRIT: 38.3 % (ref 36.0–46.0)
Hemoglobin: 13.5 g/dL (ref 12.0–15.0)
LYMPHS ABS: 3.3 10*3/uL (ref 0.7–4.0)
Lymphocytes Relative: 45 % (ref 12–46)
MCH: 33.9 pg (ref 26.0–34.0)
MCHC: 35.2 g/dL (ref 30.0–36.0)
MCV: 96.2 fL (ref 78.0–100.0)
MONO ABS: 0.4 10*3/uL (ref 0.1–1.0)
MONOS PCT: 6 % (ref 3–12)
Neutro Abs: 3.3 10*3/uL (ref 1.7–7.7)
Neutrophils Relative %: 46 % (ref 43–77)
Platelets: 249 10*3/uL (ref 150–400)
RBC: 3.98 MIL/uL (ref 3.87–5.11)
RDW: 14.9 % (ref 11.5–15.5)
WBC: 7.2 10*3/uL (ref 4.0–10.5)

## 2013-08-06 LAB — POCT PREGNANCY, URINE: Preg Test, Ur: NEGATIVE

## 2013-08-06 MED ORDER — OXYCODONE-ACETAMINOPHEN 5-325 MG PO TABS: 2.0000 | ORAL_TABLET | ORAL | Status: DC | PRN

## 2013-08-06 MED ORDER — KETOROLAC TROMETHAMINE 60 MG/2ML IM SOLN
60.0000 mg | Freq: Once | INTRAMUSCULAR | Status: AC
  Administered 2013-08-06: 60 mg via INTRAMUSCULAR
  Filled 2013-08-06: qty 2

## 2013-08-06 MED ORDER — OXYCODONE-ACETAMINOPHEN 5-325 MG PO TABS
2.0000 | ORAL_TABLET | Freq: Once | ORAL | Status: AC
  Administered 2013-08-06: 2 via ORAL
  Filled 2013-08-06: qty 2

## 2013-08-06 MED ORDER — NAPROXEN 500 MG PO TABS: 500.0000 mg | ORAL_TABLET | Freq: Two times a day (BID) | ORAL | Status: DC

## 2013-08-06 NOTE — Discharge Instructions (Signed)
Please call your doctor for a followup appointment within 24-48 hours. When you talk to your doctor please let them know that you were seen in the emergency department and have them acquire all of your records so that they can discuss the findings with you and formulate a treatment plan to fully care for your new and ongoing problems. ° °

## 2013-08-06 NOTE — ED Notes (Addendum)
Patient presents with c/oRLQ pain with a known ovarian cyst.  States she had her left ovary removed due to the same problem.

## 2013-08-06 NOTE — ED Notes (Signed)
C/o RLQ/pelvic pain x 2 weeks that radiates to groin.  States she saw OB-GYN on Monday and was told she had ovarian cyst.  C/o nausea and vomiting.

## 2013-08-06 NOTE — ED Notes (Signed)
Discharge inst given  Voiced understanding 

## 2013-08-06 NOTE — ED Provider Notes (Signed)
CSN: [id number]     Arrival date & time 08/06/13  0024 History   First MD Initiated Contact with Patient 08/06/13 0118     Chief Complaint  Patient presents with  . Abdominal Pain   (Consider location/radiation/quality/duration/timing/severity/associated sxs/prior Treatment) HPI Comments: 33 year old female, history of left oophorectomy secondary to ovarian cyst and significant pain who presents with approximately 2 weeks of gradual onset, intermittent but gradually worsening right pelvic pain. This pain radiates in towards the vagina and is associated with difficulty urinating. She denies any vaginal discharge or bleeding, she has been seen multiple times in the ER on January 23, January 25 and at her OB/GYN office on Monday of this week. She has been told to take anti-inflammatories and that a oophorectomy of the right ovary may be necessary. She denies vomiting, fever, back pain. She took hydrocodone prior to arrival with minimal improvement, takes occasional ibuprofen  Patient is a 33 y.o. female presenting with abdominal pain. The history is provided by the patient, a relative and medical records.  Abdominal Pain   Past Medical History  Diagnosis Date  . Seizures   . Bipolar 1 disorder   . Asthma   . Substance abuse   . Chronic back pain   . Sciatica   . Diverticulitis   . Renal disorder     kidney stones   Past Surgical History  Procedure Laterality Date  . Cholecystectomy    . Abdominal hysterectomy    . Abdominal surgery    . Dilation and curettage of uterus    . Colonoscopy N/A 11/24/2012    Procedure: COLONOSCOPY;  Surgeon: Malissa Hippo, MD;  Location: [facility];  Service: Endoscopy;  Laterality: N/A;   No family history on file. History  Substance Use Topics  . Smoking status: Current Every Day Smoker -- 1.00 packs/day    Types: Cigarettes  . Smokeless tobacco: Not on file  . Alcohol Use: 0.0 oz/week     Comment: occ   OB History   Grav Para Term Preterm  Abortions TAB SAB Ect Mult Living                 Review of Systems  Gastrointestinal: Positive for abdominal pain.  All other systems reviewed and are negative.    Allergies  Black pepper; Coconut flavor; Metronidazole; Penicillins; Rocephin; Propoxyphene n-acetaminophen; and Ultram  Home Medications   Current Outpatient Rx  Name  Route  Sig  Dispense  Refill  . albuterol (PROVENTIL HFA;VENTOLIN HFA) 108 (90 BASE) MCG/ACT inhaler   Inhalation   Inhale 2 puffs into the lungs every 6 (six) hours as needed. Shortness of breath         . carbamazepine (TEGRETOL) 100 MG chewable tablet   Oral   Chew 150 mg by mouth 3 (three) times daily.          Marland Kitchen gabapentin (NEURONTIN) 300 MG capsule   Oral   Take 300 mg by mouth 3 (three) times daily.           . naproxen (NAPROSYN) 500 MG tablet   Oral   Take 1 tablet (500 mg total) by mouth 2 (two) times daily with a meal.   30 tablet   0   . oxyCODONE-acetaminophen (PERCOCET/ROXICET) 5-325 MG per tablet   Oral   Take 2 tablets by mouth every 4 (four) hours as needed for severe pain.   6 tablet   0    BP 127/79  Pulse 110  Temp(Src) 98 F (36.7 C) (Oral)  Resp 20  Ht 5\' 8"  (1.727 m)  Wt 148 lb (67.132 kg)  BMI 22.51 kg/m2  SpO2 99% Physical Exam  Nursing note and vitals reviewed. Constitutional: She appears well-developed and well-nourished. No distress.  HENT:  Head: Normocephalic and atraumatic.  Mouth/Throat: Oropharynx is clear and moist. No oropharyngeal exudate.  Eyes: Conjunctivae and EOM are normal. Pupils are equal, round, and reactive to light. Right eye exhibits no discharge. Left eye exhibits no discharge. No scleral icterus.  Neck: Normal range of motion. Neck supple. No JVD present. No thyromegaly present.  Cardiovascular: Normal rate, regular rhythm, normal heart sounds and intact distal pulses.  Exam reveals no gallop and no friction rub.   No murmur heard. Pulmonary/Chest: Effort normal and breath  sounds normal. No respiratory distress. She has no wheezes. She has no rales.  Abdominal: Soft. Bowel sounds are normal. She exhibits no distension and no mass. There is tenderness ( Suprapubic and right lower quadrant, right pelvic tenderness, no other abdominal tenderness, no peritoneal signs, no Rovsing sign, no psoas sign, no obturator sign). There is no rebound and no guarding.  Musculoskeletal: Normal range of motion. She exhibits no edema and no tenderness.  Lymphadenopathy:    She has no cervical adenopathy.  Neurological: She is alert. Coordination normal.  Skin: Skin is warm and dry. No rash noted. No erythema.  Psychiatric: She has a normal mood and affect. Her behavior is normal.    ED Course  Procedures (including critical care time) Labs Review Labs Reviewed  COMPREHENSIVE METABOLIC PANEL - Abnormal; Notable for the following:    Potassium 3.6 (*)    Glucose, Bld 106 (*)    BUN 5 (*)    All other components within normal limits  CBC WITH DIFFERENTIAL  URINALYSIS, ROUTINE W REFLEX MICROSCOPIC  POCT PREGNANCY, URINE   Imaging Review No results found.  EKG Interpretation   None       MDM   1. Abdominal pain   2. Ovarian cyst    The patient appears to be in slight discomfort, review of the prior imaging studies showed a large ovarian cyst on the right ovary on CT scan from 2 weeks ago. She will need to continue to followup with GYN for possible removal of her ovary, she will need more intensive anti-inflammatory treatment, she has hydrocodone at home.  Patient is improved after medications, stable for discharge, patient is requesting discharge   Meds given in ED:  Medications  ketorolac (TORADOL) injection 60 mg (60 mg Intramuscular Given 08/06/13 0135)  oxyCODONE-acetaminophen (PERCOCET/ROXICET) 5-325 MG per tablet 2 tablet (2 tablets Oral Given 08/06/13 0134)    New Prescriptions   NAPROXEN (NAPROSYN) 500 MG TABLET    Take 1 tablet (500 mg total) by mouth 2  (two) times daily with a meal.   OXYCODONE-ACETAMINOPHEN (PERCOCET/ROXICET) 5-325 MG PER TABLET    Take 2 tablets by mouth every 4 (four) hours as needed for severe pain.      Vida RollerBrian D Wanona Stare, MD 08/06/13 [phone]

## 2013-08-07 ENCOUNTER — Encounter (HOSPITAL_COMMUNITY): Payer: Self-pay | Admitting: Emergency Medicine

## 2013-08-07 ENCOUNTER — Emergency Department (HOSPITAL_COMMUNITY)
Admission: EM | Admit: 2013-08-07 | Discharge: 2013-08-07 | Disposition: A | Discharge status: Home / Self Care | Payer: Medicaid Other | Attending: Emergency Medicine | Admitting: Emergency Medicine

## 2013-08-07 DIAGNOSIS — F319 Bipolar disorder, unspecified: Secondary | ICD-10-CM | POA: Insufficient documentation

## 2013-08-07 DIAGNOSIS — N949 Unspecified condition associated with female genital organs and menstrual cycle: Secondary | ICD-10-CM | POA: Insufficient documentation

## 2013-08-07 DIAGNOSIS — Z79899 Other long term (current) drug therapy: Secondary | ICD-10-CM | POA: Insufficient documentation

## 2013-08-07 DIAGNOSIS — R112 Nausea with vomiting, unspecified: Secondary | ICD-10-CM | POA: Insufficient documentation

## 2013-08-07 DIAGNOSIS — G40909 Epilepsy, unspecified, not intractable, without status epilepticus: Secondary | ICD-10-CM | POA: Insufficient documentation

## 2013-08-07 DIAGNOSIS — Z3202 Encounter for pregnancy test, result negative: Secondary | ICD-10-CM | POA: Insufficient documentation

## 2013-08-07 DIAGNOSIS — R102 Pelvic and perineal pain: Secondary | ICD-10-CM

## 2013-08-07 DIAGNOSIS — Z87442 Personal history of urinary calculi: Secondary | ICD-10-CM | POA: Insufficient documentation

## 2013-08-07 DIAGNOSIS — Z791 Long term (current) use of non-steroidal anti-inflammatories (NSAID): Secondary | ICD-10-CM | POA: Insufficient documentation

## 2013-08-07 DIAGNOSIS — G8929 Other chronic pain: Secondary | ICD-10-CM | POA: Insufficient documentation

## 2013-08-07 DIAGNOSIS — F17200 Nicotine dependence, unspecified, uncomplicated: Secondary | ICD-10-CM | POA: Insufficient documentation

## 2013-08-07 DIAGNOSIS — Z8719 Personal history of other diseases of the digestive system: Secondary | ICD-10-CM | POA: Insufficient documentation

## 2013-08-07 DIAGNOSIS — H5789 Other specified disorders of eye and adnexa: Secondary | ICD-10-CM | POA: Insufficient documentation

## 2013-08-07 DIAGNOSIS — Z880 Allergy status to penicillin: Secondary | ICD-10-CM | POA: Insufficient documentation

## 2013-08-07 DIAGNOSIS — J45909 Unspecified asthma, uncomplicated: Secondary | ICD-10-CM | POA: Insufficient documentation

## 2013-08-07 LAB — CBC WITH DIFFERENTIAL/PLATELET
BASOS ABS: 0 10*3/uL (ref 0.0–0.1)
Basophils Relative: 0 % (ref 0–1)
EOS ABS: 0.2 10*3/uL (ref 0.0–0.7)
EOS PCT: 3 % (ref 0–5)
HCT: 37.8 % (ref 36.0–46.0)
Hemoglobin: 13.2 g/dL (ref 12.0–15.0)
Lymphocytes Relative: 40 % (ref 12–46)
Lymphs Abs: 1.9 10*3/uL (ref 0.7–4.0)
MCH: 33.8 pg (ref 26.0–34.0)
MCHC: 34.9 g/dL (ref 30.0–36.0)
MCV: 96.7 fL (ref 78.0–100.0)
Monocytes Absolute: 0.3 10*3/uL (ref 0.1–1.0)
Monocytes Relative: 5 % (ref 3–12)
Neutro Abs: 2.4 10*3/uL (ref 1.7–7.7)
Neutrophils Relative %: 51 % (ref 43–77)
PLATELETS: 239 10*3/uL (ref 150–400)
RBC: 3.91 MIL/uL (ref 3.87–5.11)
RDW: 15 % (ref 11.5–15.5)
WBC: 4.7 10*3/uL (ref 4.0–10.5)

## 2013-08-07 LAB — COMPREHENSIVE METABOLIC PANEL
ALBUMIN: 4 g/dL (ref 3.5–5.2)
ALT: 8 U/L (ref 0–35)
AST: 21 U/L (ref 0–37)
Alkaline Phosphatase: 90 U/L (ref 39–117)
BUN: 8 mg/dL (ref 6–23)
CALCIUM: 9.2 mg/dL (ref 8.4–10.5)
CO2: 25 mEq/L (ref 19–32)
Chloride: 99 mEq/L (ref 96–112)
Creatinine, Ser: 0.75 mg/dL (ref 0.50–1.10)
GFR calc non Af Amer: >90 mL/min (ref >90)
GLUCOSE: 78 mg/dL (ref 70–99)
POTASSIUM: 3.6 meq/L — AB (ref 3.7–5.3)
SODIUM: 136 meq/L — AB (ref 137–147)
TOTAL PROTEIN: 7.2 g/dL (ref 6.0–8.3)
Total Bilirubin: 0.2 mg/dL — ABNORMAL LOW (ref 0.3–1.2)

## 2013-08-07 LAB — URINALYSIS, ROUTINE W REFLEX MICROSCOPIC
Bilirubin Urine: NEGATIVE
Glucose, UA: NEGATIVE mg/dL
Hgb urine dipstick: NEGATIVE
Ketones, ur: NEGATIVE mg/dL
Leukocytes, UA: NEGATIVE
Nitrite: NEGATIVE
PROTEIN: NEGATIVE mg/dL
SPECIFIC GRAVITY, URINE: 1.009 (ref 1.005–1.030)
UROBILINOGEN UA: 0.2 mg/dL (ref 0.0–1.0)
pH: 6.5 (ref 5.0–8.0)

## 2013-08-07 LAB — POCT PREGNANCY, URINE: Preg Test, Ur: NEGATIVE

## 2013-08-07 MED ORDER — OXYCODONE-ACETAMINOPHEN 5-325 MG PO TABS: 1.0000 | ORAL_TABLET | Freq: Four times a day (QID) | ORAL | Status: DC | PRN

## 2013-08-07 NOTE — ED Notes (Addendum)
Pt. C/o right lower quadrant pain. States she was diagnosed with ovarian cyst and has run out of pain medicine. Last took percocet at 0300 today. Pt. Has appointment with Dr. Emelda FearFerguson on Friday. Reports N/V/D and intermittent fevers.

## 2013-08-07 NOTE — ED Notes (Signed)
Pt is here with right lower abdominal pain that she was seen here and diagnosed with ovarian cyst.  Pt is reporting vomiting and diarrhea since last nite and has ran out of pain medication.

## 2013-08-07 NOTE — Discharge Instructions (Signed)
Pelvic Pain  Pelvic pain is pain felt below the belly button and between your hips. It can be caused by many different things. It is important to get help right away. This is especially true for severe, sharp, or unusual pain that comes on suddenly.   HOME CARE   Only take medicine as told by your doctor.   Rest as told by your doctor.   Eat a healthy diet, such as fruits, vegetables, and lean meats.   Drink enough fluids to keep your pee (urine) clear or pale yellow, or as told.   Avoid sex (intercourse) if it causes pain.   Apply warm or cold packs to your lower belly (abdomen). Use the type of pack that helps the pain.   Avoid situations that cause you stress.   Keep a journal to track your pain. Write down:   When the pain started.   Where it is located.   If there are things that seem to be related to the pain, such as food or your period.   Follow up with your doctor as told.  GET HELP RIGHT AWAY IF:    You have heavy bleeding from the vagina.   You have more pelvic pain.   You feel lightheaded or pass out (faint).   You have chills.   You have pain when you pee or have blood in your pee.   You cannot stop having watery poop (diarrhea).   You cannot stop throwing up (vomiting).   You have a fever or lasting symptoms for more than 3 days.   You have a fever and your symptoms suddenly get worse.   You are being physically or sexually abused.   Your medicine does not help your pain.   You have fluid (discharge) coming from your vagina that is not normal.  MAKE SURE YOU:   Understand these instructions.   Will watch your condition.   Will get help if you are not doing well or get worse.  Document Released: 12/03/2007 Document Revised: 12/16/2011 Document Reviewed: 10/06/2011  [employer] Patient Information 2014 [employer]

## 2013-08-07 NOTE — ED Provider Notes (Signed)
CSN: [id number]     Arrival date & time 08/07/13  1512 History   First MD Initiated Contact with Patient 08/07/13 1639     Chief Complaint  Patient presents with  . Abdominal Pain   (Consider location/radiation/quality/duration/timing/severity/associated sxs/prior Treatment) HPI Comments: 33 year old white female presents emergency department with chief complaint of abdominal pain. Patient reports a history of a right ovarian cyst. She has a followup pending with gynecology this upcoming week. Initial diagnosis was made approximately 2 weeks ago. Patient was seen here 2 days ago for same pain and was given 6 Percocet. She is out of her pain meds and is requesting pain medication until she can see the specialist. She denies vaginal discharge vaginal bleeding or new symptoms.  Review of visit from late January revealed she was positive for trichomonas for which she received treatment and test of cure.  Patient is a 33 y.o. female presenting with abdominal pain. The history is provided by the patient.  Abdominal Pain Pain location:  Suprapubic and RLQ Pain quality: aching   Pain radiates to:  Does not radiate Pain severity:  Severe Onset quality:  Gradual Duration:  2 weeks Timing:  Constant Progression:  Unchanged Chronicity:  Chronic Relieved by: pain meds, but pt ran out. Worsened by:  Nothing tried Ineffective treatments:  None tried Associated symptoms: nausea and vomiting   Associated symptoms: no constipation, no diarrhea, no hematuria, no vaginal bleeding and no vaginal discharge     Past Medical History  Diagnosis Date  . Seizures   . Bipolar 1 disorder   . Asthma   . Substance abuse   . Chronic back pain   . Sciatica   . Diverticulitis   . Renal disorder     kidney stones   Past Surgical History  Procedure Laterality Date  . Cholecystectomy    . Abdominal hysterectomy    . Abdominal surgery    . Dilation and curettage of uterus    . Colonoscopy N/A 11/24/2012   Procedure: COLONOSCOPY;  Surgeon: Malissa HippoNajeeb U Rehman, MD;  Location: [facility];  Service: Endoscopy;  Laterality: N/A;   No family history on file. History  Substance Use Topics  . Smoking status: Current Every Day Smoker -- 1.00 packs/day    Types: Cigarettes  . Smokeless tobacco: Not on file  . Alcohol Use: 0.0 oz/week     Comment: occ   OB History   Grav Para Term Preterm Abortions TAB SAB Ect Mult Living                 Review of Systems  Constitutional: Negative.   HENT: Negative.   Eyes: Negative.   Respiratory: Negative.   Cardiovascular: Negative.   Gastrointestinal: Positive for nausea, vomiting and abdominal pain. Negative for diarrhea, constipation, blood in stool and anal bleeding.  Endocrine: Negative.   Genitourinary: Positive for pelvic pain. Negative for urgency, frequency, hematuria, flank pain, decreased urine volume, vaginal bleeding, vaginal discharge, genital sores and menstrual problem.       Pelvic pain reported  Musculoskeletal: Negative.   Skin: Negative.   Allergic/Immunologic: Negative.   Neurological: Negative.     Allergies  Black pepper; Coconut flavor; Metronidazole; Penicillins; Rocephin; Propoxyphene n-acetaminophen; and Ultram  Home Medications   Current Outpatient Rx  Name  Route  Sig  Dispense  Refill  . albuterol (PROVENTIL HFA;VENTOLIN HFA) 108 (90 BASE) MCG/ACT inhaler   Inhalation   Inhale 2 puffs into the lungs every 6 (six) hours as needed.  Shortness of breath         . carbamazepine (TEGRETOL) 100 MG chewable tablet   Oral   Chew 150 mg by mouth 3 (three) times daily.          Marland Kitchen gabapentin (NEURONTIN) 300 MG capsule   Oral   Take 300 mg by mouth 3 (three) times daily.           . naproxen (NAPROSYN) 500 MG tablet   Oral   Take 1 tablet (500 mg total) by mouth 2 (two) times daily with a meal.   30 tablet   0   . oxyCODONE-acetaminophen (PERCOCET/ROXICET) 5-325 MG per tablet   Oral   Take 2 tablets by mouth  every 4 (four) hours as needed for severe pain.   6 tablet   0    BP 124/82  Pulse 93  Temp(Src) 98 F (36.7 C) (Oral)  Resp 18  SpO2 98% Physical Exam  Nursing note and vitals reviewed. Constitutional: She is oriented to person, place, and time. She appears well-developed and well-nourished.  HENT:  Head: Normocephalic and atraumatic.  Mouth/Throat: Oropharynx is clear and moist.  Eyes: Conjunctivae are normal. Right eye exhibits no discharge. Left eye exhibits discharge.  Neck: Normal range of motion. Neck supple. No JVD present. No tracheal deviation present.  Cardiovascular: Normal rate and regular rhythm.   Pulmonary/Chest: Effort normal and breath sounds normal. She has no wheezes. She has no rales.  Abdominal: Soft. Bowel sounds are normal. She exhibits no distension and no mass. There is tenderness. There is no rebound and no guarding.  Mild to moderate tenderness palpation in the lower abdomen, no guarding rigidity or masses, no tenderness palpation McBurney's point, negative [name] sign.  Genitourinary:  Pelvic exam declined by the patient as she has no vaginal discharge or vaginal bleeding.  Musculoskeletal: Normal range of motion. She exhibits no edema and no tenderness.  Neurological: She is alert and oriented to person, place, and time. She has normal reflexes.  Skin: Skin is warm and dry.    ED Course  Procedures (including critical care time) Labs Review Labs Reviewed  COMPREHENSIVE METABOLIC PANEL - Abnormal; Notable for the following:    Sodium 136 (*)    Potassium 3.6 (*)    Total Bilirubin 0.2 (*)    All other components within normal limits  URINALYSIS, ROUTINE W REFLEX MICROSCOPIC  CBC WITH DIFFERENTIAL  POCT PREGNANCY, URINE   Imaging Review No results found.  EKG Interpretation   None       MDM   1. Pelvic pain   2. Chronic female pelvic pain    33 year old white female presents emergency bar with chief complaint of pelvic pain  consistent with her recurring symptoms. She has a upcoming followup with gynecology within the next week. She was just seen in the emergency department 2 days ago and  Was given 6 Percocet which is gone   Triage lab work was obtained which reveals an unremarkable CBC, CMP, urinalysis, urine pregnancy.  Patient declined pelvic exam. AFVSS, benign exam. No indication for further imaging or consultation at this time. I informed the patient that I would give her a one-time refill of Percocet 20 tabs to last her until her followup appointment. She was advised to the ER is not the appropriate place for pain management.  She states she will followup with her primary care provider and specialist.  ER precautions were given and the patient without question at time of discharge.  Darlys Gales, MD 08/08/13 1242

## 2013-08-09 ENCOUNTER — Ambulatory Visit: Payer: Medicaid Other | Admitting: Obstetrics and Gynecology

## 2013-08-10 ENCOUNTER — Emergency Department (HOSPITAL_COMMUNITY): Payer: Medicaid Other

## 2013-08-10 ENCOUNTER — Encounter (HOSPITAL_COMMUNITY): Payer: Self-pay | Admitting: Emergency Medicine

## 2013-08-10 ENCOUNTER — Emergency Department (HOSPITAL_COMMUNITY)
Admission: EM | Admit: 2013-08-10 | Discharge: 2013-08-10 | Disposition: A | Discharge status: Home / Self Care | Payer: Medicaid Other | Attending: Emergency Medicine | Admitting: Emergency Medicine

## 2013-08-10 DIAGNOSIS — Z8719 Personal history of other diseases of the digestive system: Secondary | ICD-10-CM | POA: Insufficient documentation

## 2013-08-10 DIAGNOSIS — R1110 Vomiting, unspecified: Secondary | ICD-10-CM | POA: Insufficient documentation

## 2013-08-10 DIAGNOSIS — R1031 Right lower quadrant pain: Secondary | ICD-10-CM | POA: Insufficient documentation

## 2013-08-10 DIAGNOSIS — Z8739 Personal history of other diseases of the musculoskeletal system and connective tissue: Secondary | ICD-10-CM | POA: Insufficient documentation

## 2013-08-10 DIAGNOSIS — G8929 Other chronic pain: Secondary | ICD-10-CM | POA: Insufficient documentation

## 2013-08-10 DIAGNOSIS — Z880 Allergy status to penicillin: Secondary | ICD-10-CM | POA: Insufficient documentation

## 2013-08-10 DIAGNOSIS — Z3202 Encounter for pregnancy test, result negative: Secondary | ICD-10-CM | POA: Insufficient documentation

## 2013-08-10 DIAGNOSIS — Z87448 Personal history of other diseases of urinary system: Secondary | ICD-10-CM | POA: Insufficient documentation

## 2013-08-10 DIAGNOSIS — Z87442 Personal history of urinary calculi: Secondary | ICD-10-CM | POA: Insufficient documentation

## 2013-08-10 DIAGNOSIS — R319 Hematuria, unspecified: Secondary | ICD-10-CM | POA: Insufficient documentation

## 2013-08-10 DIAGNOSIS — F319 Bipolar disorder, unspecified: Secondary | ICD-10-CM | POA: Insufficient documentation

## 2013-08-10 DIAGNOSIS — Z9089 Acquired absence of other organs: Secondary | ICD-10-CM | POA: Insufficient documentation

## 2013-08-10 DIAGNOSIS — F17200 Nicotine dependence, unspecified, uncomplicated: Secondary | ICD-10-CM | POA: Insufficient documentation

## 2013-08-10 DIAGNOSIS — N23 Unspecified renal colic: Secondary | ICD-10-CM | POA: Insufficient documentation

## 2013-08-10 DIAGNOSIS — G40909 Epilepsy, unspecified, not intractable, without status epilepticus: Secondary | ICD-10-CM | POA: Insufficient documentation

## 2013-08-10 DIAGNOSIS — Z79899 Other long term (current) drug therapy: Secondary | ICD-10-CM | POA: Insufficient documentation

## 2013-08-10 DIAGNOSIS — N390 Urinary tract infection, site not specified: Secondary | ICD-10-CM

## 2013-08-10 DIAGNOSIS — Z90710 Acquired absence of both cervix and uterus: Secondary | ICD-10-CM | POA: Insufficient documentation

## 2013-08-10 DIAGNOSIS — J45909 Unspecified asthma, uncomplicated: Secondary | ICD-10-CM | POA: Insufficient documentation

## 2013-08-10 DIAGNOSIS — Z791 Long term (current) use of non-steroidal anti-inflammatories (NSAID): Secondary | ICD-10-CM | POA: Insufficient documentation

## 2013-08-10 LAB — URINE MICROSCOPIC-ADD ON

## 2013-08-10 LAB — URINALYSIS, ROUTINE W REFLEX MICROSCOPIC
GLUCOSE, UA: NEGATIVE mg/dL
Ketones, ur: 15 mg/dL — AB
Nitrite: NEGATIVE
PH: 6 (ref 5.0–8.0)
Protein, ur: NEGATIVE mg/dL
SPECIFIC GRAVITY, URINE: 1.033 — AB (ref 1.005–1.030)
Urobilinogen, UA: 1 mg/dL (ref 0.0–1.0)

## 2013-08-10 LAB — POCT PREGNANCY, URINE: PREG TEST UR: NEGATIVE

## 2013-08-10 MED ORDER — TAMSULOSIN HCL 0.4 MG PO CAPS: 0.4000 mg | ORAL_CAPSULE | Freq: Two times a day (BID) | ORAL | Status: DC

## 2013-08-10 MED ORDER — PROMETHAZINE HCL 25 MG/ML IJ SOLN
25.0000 mg | INTRAMUSCULAR | Status: AC
Start: 2013-08-10 — End: 2013-08-10
  Administered 2013-08-10: 25 mg via INTRAVENOUS
  Filled 2013-08-10: qty 1

## 2013-08-10 MED ORDER — PROMETHAZINE HCL 25 MG PO TABS: 25.0000 mg | ORAL_TABLET | Freq: Four times a day (QID) | ORAL | Status: DC | PRN

## 2013-08-10 MED ORDER — KETOROLAC TROMETHAMINE 30 MG/ML IJ SOLN
30.0000 mg | Freq: Once | INTRAMUSCULAR | Status: AC
  Administered 2013-08-10: 30 mg via INTRAVENOUS
  Filled 2013-08-10: qty 1

## 2013-08-10 MED ORDER — HYDROCODONE-ACETAMINOPHEN 5-325 MG PO TABS: 1.0000 | ORAL_TABLET | Freq: Four times a day (QID) | ORAL | Status: DC | PRN

## 2013-08-10 MED ORDER — MORPHINE SULFATE 4 MG/ML IJ SOLN
6.0000 mg | Freq: Once | INTRAMUSCULAR | Status: AC
  Administered 2013-08-10: 6 mg via INTRAVENOUS
  Filled 2013-08-10: qty 2

## 2013-08-10 MED ORDER — SULFAMETHOXAZOLE-TRIMETHOPRIM 800-160 MG PO TABS: 1.0000 | ORAL_TABLET | Freq: Two times a day (BID) | ORAL | Status: DC

## 2013-08-10 MED ORDER — SODIUM CHLORIDE 0.9 % IV BOLUS (SEPSIS)
1000.0000 mL | Freq: Once | INTRAVENOUS | Status: AC
  Administered 2013-08-10: 1000 mL via INTRAVENOUS

## 2013-08-10 MED ORDER — NAPROXEN 500 MG PO TABS: 500.0000 mg | ORAL_TABLET | Freq: Two times a day (BID) | ORAL | Status: DC

## 2013-08-10 NOTE — ED Notes (Signed)
Pt. Reports right lower quadrant pain due to "ovarian cyst". Pt. Reports blood in urine. Seen here previously for same and has ran out of pain medicine. Last took at 0800 this AM.

## 2013-08-10 NOTE — Discharge Instructions (Signed)
Keep your appointment on Monday with Dr. Emelda FearFerguson. Take medications as directed. Return to ED should your symptoms worsen, you develop fever/chills, or unable to urinate.

## 2013-08-10 NOTE — ED Notes (Signed)
Pt presents to department for evaluation of R sided abdominal pain radiating to back, hematuria and nausea/vomiting. Was seen for same several days ago and discharged home. 9/10 pain at the time. Pt is alert and oriented x4. History of ovarian cancer. No signs of acute distress noted.

## 2013-08-10 NOTE — ED Provider Notes (Signed)
CSN: [id number]     Arrival date & time 08/10/13  1530 History   First MD Initiated Contact with Patient 08/10/13 1734     Chief Complaint  Patient presents with  . Abdominal Pain  . Emesis  . Hematuria     (Consider location/radiation/quality/duration/timing/severity/associated sxs/prior Treatment) HPI 33 yo female presents with right sided abdominal pain that started 3 weeks ago. Gradually worsening. Patient states she was seen here 5 days ago for similar pain but now out of pain medication so returning to ED for pain control and pain is getting a little worse. Patient states that she was had a CT scan 2 weeks ago that showed a large ovarian cyst on her right ovary. Pain described as constant with waxing and waning pain. Pain rated at 9/10 and radiates to back. Admits to N/V x multiple episodes. Patient states she gets sick any time she eats any solids. Denies any diarrhea or constipation. Admits to Fever off and on. Denies chills. Patient admits to urinary urgency, dysuria, and hematuria. Denies urinary frequency. Denies any vaginal discharge or bleeding. PMH significant for Kidney stones, Diverticulitis, Chronic back pain, Substance abuse, Seizures. Surgical hx significant for Cholecystectomy, Abdominal hysterectomy. Patient states she has an appointment with her OBGYN Dr. Emelda Fear on Monday.  Past Medical History  Diagnosis Date  . Seizures   . Bipolar 1 disorder   . Asthma   . Substance abuse   . Chronic back pain   . Sciatica   . Diverticulitis   . Renal disorder     kidney stones   Past Surgical History  Procedure Laterality Date  . Cholecystectomy    . Abdominal hysterectomy    . Abdominal surgery    . Dilation and curettage of uterus    . Colonoscopy N/A 11/24/2012    Procedure: COLONOSCOPY;  Surgeon: Malissa Hippo, MD;  Location: [facility];  Service: Endoscopy;  Laterality: N/A;   No family history on file. History  Substance Use Topics  . Smoking status: Current  Every Day Smoker -- 1.00 packs/day    Types: Cigarettes  . Smokeless tobacco: Not on file  . Alcohol Use: 0.0 oz/week     Comment: occ   OB History   Grav Para Term Preterm Abortions TAB SAB Ect Mult Living                 Review of Systems  All other systems reviewed and are negative.      Allergies  Black pepper; Coconut flavor; Metronidazole; Penicillins; Rocephin; Propoxyphene n-acetaminophen; and Ultram  Home Medications   Current Outpatient Rx  Name  Route  Sig  Dispense  Refill  . albuterol (PROVENTIL HFA;VENTOLIN HFA) 108 (90 BASE) MCG/ACT inhaler   Inhalation   Inhale 2 puffs into the lungs every 6 (six) hours as needed. Shortness of breath         . carbamazepine (TEGRETOL) 100 MG chewable tablet   Oral   Chew 150 mg by mouth 3 (three) times daily.          Marland Kitchen gabapentin (NEURONTIN) 300 MG capsule   Oral   Take 300 mg by mouth 3 (three) times daily.           . naproxen (NAPROSYN) 500 MG tablet   Oral   Take 1 tablet (500 mg total) by mouth 2 (two) times daily with a meal.   30 tablet   0   . oxyCODONE-acetaminophen (PERCOCET/ROXICET) 5-325 MG per tablet  Oral   Take 2 tablets by mouth every 4 (four) hours as needed for severe pain.   6 tablet   0   . HYDROcodone-acetaminophen (NORCO/VICODIN) 5-325 MG per tablet   Oral   Take 1 tablet by mouth every 6 (six) hours as needed.   6 tablet   0   . naproxen (NAPROSYN) 500 MG tablet   Oral   Take 1 tablet (500 mg total) by mouth 2 (two) times daily with a meal.   30 tablet   0   . promethazine (PHENERGAN) 25 MG tablet   Oral   Take 1 tablet (25 mg total) by mouth every 6 (six) hours as needed for nausea or vomiting.   12 tablet   0   . sulfamethoxazole-trimethoprim (SEPTRA DS) 800-160 MG per tablet   Oral   Take 1 tablet by mouth 2 (two) times daily.   10 tablet   0   . tamsulosin (FLOMAX) 0.4 MG CAPS capsule   Oral   Take 1 capsule (0.4 mg total) by mouth 2 (two) times daily.   10  capsule   0    BP 105/64  Pulse 82  Temp(Src) 97.9 F (36.6 C) (Oral)  Resp 17  Wt 145 lb (65.772 kg)  SpO2 98% Physical Exam  Nursing note and vitals reviewed. Constitutional: She is oriented to person, place, and time. She appears well-developed and well-nourished. No distress.  HENT:  Head: Normocephalic and atraumatic.  Eyes: Conjunctivae are normal. No scleral icterus.  Neck: No JVD present. No tracheal deviation present.  Cardiovascular: Normal rate and regular rhythm.  Exam reveals no gallop and no friction rub.   No murmur heard. Pulmonary/Chest: Effort normal and breath sounds normal. No respiratory distress. She has no wheezes. She has no rhonchi. She has no rales.  Abdominal: Soft. Bowel sounds are normal. She exhibits no distension. There is no hepatosplenomegaly. There is tenderness in the right lower quadrant. There is CVA tenderness (RIGHT). There is no rigidity, no rebound, no guarding, no tenderness at McBurney's point and negative [name]'s sign.  Musculoskeletal: Normal range of motion. She exhibits no edema.  Neurological: She is alert and oriented to person, place, and time.  Skin: Skin is warm and dry. She is not diaphoretic.  Psychiatric: She has a normal mood and affect. Her behavior is normal.    ED Course  Procedures (including critical care time) Labs Review Labs Reviewed  URINALYSIS, ROUTINE W REFLEX MICROSCOPIC - Abnormal; Notable for the following:    Color, Urine AMBER (*)    APPearance CLOUDY (*)    Specific Gravity, Urine 1.033 (*)    Hgb urine dipstick LARGE (*)    Bilirubin Urine SMALL (*)    Ketones, ur 15 (*)    Leukocytes, UA TRACE (*)    All other components within normal limits  URINE MICROSCOPIC-ADD ON - Abnormal; Notable for the following:    Squamous Epithelial / LPF FEW (*)    All other components within normal limits  POCT PREGNANCY, URINE   Imaging Review Dg Abd 1 View  08/10/2013   CLINICAL DATA:  Lower right abdominal and  flank pain, vomiting, and hematuria for 3 weeks, history kidney stones and right ovarian cyst  EXAM: ABDOMEN - 1 VIEW  COMPARISON:  02/07/2012  FINDINGS: Surgical clips right upper quadrant likely cholecystectomy.  Normal bowel gas pattern with scattered stool throughout colon.  No bowel dilatation or bowel wall thickening.  No urinary tract calcification identified.  Osseous structures unremarkable.  IMPRESSION: No acute abnormalities.   Electronically Signed   By: Ulyses SouthwardMark  Boles M.D.   On: 08/10/2013 18:54    EKG Interpretation   None       MDM   Final diagnoses:  Renal colic on right side  UTI (lower urinary tract infection)   Patient afebrile with normal VS.  Urine Preg negative UA consistent with dehydration, Hematuria. Patient admits to UTI symptoms. KUB shows no acute abnormalities.  Previous CT on 07/22/13 shows 2.4 cm Right Ovarian Cyst  Suspect renal colic given patient hx and workup. Discussed findings with patient. Plan to treat patient for possible kidney stones. Advised patient to keep appointment with her OBGYN on Monday for further maintenance of Right ovarian cyst. Patient agrees with plan. Discharged in good condition.   Meds given in ED:  Medications  sodium chloride 0.9 % bolus 1,000 mL (0 mLs Intravenous Stopped 08/10/13 1948)  promethazine (PHENERGAN) injection 25 mg (25 mg Intravenous Given 08/10/13 1828)  morphine 4 MG/ML injection 6 mg (6 mg Intravenous Given 08/10/13 1828)  ketorolac (TORADOL) 30 MG/ML injection 30 mg (30 mg Intravenous Given 08/10/13 1830)    Discharge Medication List as of 08/10/2013  8:02 PM    START taking these medications   Details  HYDROcodone-acetaminophen (NORCO/VICODIN) 5-325 MG per tablet Take 1 tablet by mouth every 6 (six) hours as needed., Starting 08/10/2013, Until Discontinued, Print    !! naproxen (NAPROSYN) 500 MG tablet Take 1 tablet (500 mg total) by mouth 2 (two) times daily with a meal., Starting 08/10/2013, Until  Discontinued, Print    promethazine (PHENERGAN) 25 MG tablet Take 1 tablet (25 mg total) by mouth every 6 (six) hours as needed for nausea or vomiting., Starting 08/10/2013, Until Discontinued, Print    sulfamethoxazole-trimethoprim (SEPTRA DS) 800-160 MG per tablet Take 1 tablet by mouth 2 (two) times daily., Starting 08/10/2013, Until Discontinued, Print    tamsulosin (FLOMAX) 0.4 MG CAPS capsule Take 1 capsule (0.4 mg total) by mouth 2 (two) times daily., Starting 08/10/2013, Until Discontinued, Print     !! - Potential duplicate medications found. Please discuss with provider.             Rudene AndaJacob Gray Jonaya Freshour, PA-C 08/10/13 2214

## 2013-08-10 NOTE — ED Provider Notes (Signed)
Medical screening examination/treatment/procedure(s) were conducted as a shared visit with non-physician practitioner(s) and myself.  I personally evaluated the patient during the encounter.  EKG Interpretation   None      Patient here complaining of back pain and hematuria. Recently had a negative CT of the abdomen for kidney stone. Patient given pain meds here and will be treated empirically for renal colic and will followup with her urologist.  Toy BakerAnthony T Le Faulcon, MD 08/10/13 [id number]

## 2013-08-11 ENCOUNTER — Encounter: Payer: Self-pay | Admitting: Obstetrics and Gynecology

## 2013-08-11 ENCOUNTER — Ambulatory Visit (INDEPENDENT_AMBULATORY_CARE_PROVIDER_SITE_OTHER): Payer: Medicaid Other | Admitting: Obstetrics and Gynecology

## 2013-08-11 VITALS — BP 124/80 | Ht 68.0 in | Wt 144.4 lb

## 2013-08-11 DIAGNOSIS — N23 Unspecified renal colic: Secondary | ICD-10-CM

## 2013-08-11 DIAGNOSIS — N200 Calculus of kidney: Secondary | ICD-10-CM

## 2013-08-11 MED ORDER — HYDROCODONE-ACETAMINOPHEN 5-325 MG PO TABS: 1.0000 | ORAL_TABLET | Freq: Four times a day (QID) | ORAL | Status: DC | PRN

## 2013-08-11 NOTE — Progress Notes (Signed)
This chart was transcribed for Dr. Christin BachJohn Kavontae Pritchard by Leone PayorSonum Patel, ED scribe.    [facility] Visit  Patient name: Veronica ChuteSamantha L [name] MRN [id number]  Date of birth: 06-05-1981  CC & HPI:  Veronica ChuteSamantha L [name] is a 33 y.o. female presenting [facility] seen for kidney [facility]  She has been taking Naprosyn every 4-6 hours.  "threw away ' the norco Rx to avoid duplicate prescribers. Will  [facility] to pain clinic. THIS WILL BE PT'S LAST PAIN MGMT THRU THIS OFFICE . HAS REDUCED TO 2 NORCO/DAY  ROS:  HX OPIATE ABUSE, NOW HAS CHILDREN BACK AFTER LOSING CUSTODY IN PAST  Pertinent History Reviewed:  Medical & Surgical Hx:  Reviewed: Significant for  Past Medical History  Diagnosis Date  . Seizures   . Bipolar 1 disorder   . Asthma   . Substance abuse   . Chronic back pain   . Sciatica   . Diverticulitis   . Renal disorder     kidney stones    Past Surgical History  Procedure Laterality Date  . Cholecystectomy    . Abdominal hysterectomy    . Abdominal surgery    . Dilation and curettage of uterus    . Colonoscopy N/A 11/24/2012    Procedure: COLONOSCOPY;  Surgeon: Malissa HippoNajeeb U Rehman, MD;  Location: [facility];  Service: Endoscopy;  Laterality: N/A;    Medications: Reviewed & Updated - see associated section Social History: Reviewed -  reports that she has been smoking Cigarettes.  She has been smoking about 1.00 pack per day. She has never used smokeless tobacco.  Objective Findings:  Vitals: BP 124/80  Ht 5\' 8"  (1.727 m)  Wt 144 lb 6.4 oz (65.499 kg)  BMI 21.96 kg/m2  Physical Examination: General appearance - alert, well appearing, and in no distress, oriented to person, place, and time, anxious, chronically ill appearing and crying Mental status - alert, oriented to person, place, and time, normal mood, behavior, speech, dress, motor activity, and thought processes, anxious, SEEMS CREDIBLE IN COMMENTS Abdomen - soft, nontender,  nondistended, no masses or organomegaly tenderness noted RLQ MILD-MOD, NO GUARD OR REBOUND Pelvic - VULVA: normal appearing vulva with no masses, tenderness or lesions, VAGINA: normal appearing vagina with normal color and discharge, no lesions, CERVIX: surgically absent ADX NORMAL MINIMAL PAIN   Assessment & Plan:   A: exam last night c/w kidneystone     Hx opiate abuse.     Nicotine use, improved to 1 ppd.   P:  refil norco x 60 tabs to last 1 month til pt referred to pain clinic thru [facility]>    NO MORE OPIATES THRU THIS OFFICE. PT AWARE.

## 2013-08-12 NOTE — ED Provider Notes (Signed)
Medical screening examination/treatment/procedure(s) were performed by non-physician practitioner and as supervising physician I was immediately available for consultation/collaboration.  Toy BakerAnthony T Demeisha Geraghty, MD 08/12/13 2012

## 2013-08-27 ENCOUNTER — Encounter (HOSPITAL_COMMUNITY): Payer: Self-pay | Admitting: Emergency Medicine

## 2013-08-27 ENCOUNTER — Emergency Department (HOSPITAL_COMMUNITY)
Admission: EM | Admit: 2013-08-27 | Discharge: 2013-08-27 | Disposition: A | Discharge status: Home / Self Care | Payer: Medicaid Other | Attending: Emergency Medicine | Admitting: Emergency Medicine

## 2013-08-27 DIAGNOSIS — Z8739 Personal history of other diseases of the musculoskeletal system and connective tissue: Secondary | ICD-10-CM | POA: Insufficient documentation

## 2013-08-27 DIAGNOSIS — Z791 Long term (current) use of non-steroidal anti-inflammatories (NSAID): Secondary | ICD-10-CM | POA: Insufficient documentation

## 2013-08-27 DIAGNOSIS — G8929 Other chronic pain: Secondary | ICD-10-CM | POA: Insufficient documentation

## 2013-08-27 DIAGNOSIS — F319 Bipolar disorder, unspecified: Secondary | ICD-10-CM | POA: Insufficient documentation

## 2013-08-27 DIAGNOSIS — R1031 Right lower quadrant pain: Secondary | ICD-10-CM | POA: Insufficient documentation

## 2013-08-27 DIAGNOSIS — G40909 Epilepsy, unspecified, not intractable, without status epilepticus: Secondary | ICD-10-CM | POA: Insufficient documentation

## 2013-08-27 DIAGNOSIS — M7918 Myalgia, other site: Secondary | ICD-10-CM

## 2013-08-27 DIAGNOSIS — Z8719 Personal history of other diseases of the digestive system: Secondary | ICD-10-CM | POA: Insufficient documentation

## 2013-08-27 DIAGNOSIS — Z87442 Personal history of urinary calculi: Secondary | ICD-10-CM | POA: Insufficient documentation

## 2013-08-27 DIAGNOSIS — Z79899 Other long term (current) drug therapy: Secondary | ICD-10-CM | POA: Insufficient documentation

## 2013-08-27 DIAGNOSIS — F17200 Nicotine dependence, unspecified, uncomplicated: Secondary | ICD-10-CM | POA: Insufficient documentation

## 2013-08-27 DIAGNOSIS — R300 Dysuria: Secondary | ICD-10-CM | POA: Insufficient documentation

## 2013-08-27 DIAGNOSIS — J45909 Unspecified asthma, uncomplicated: Secondary | ICD-10-CM | POA: Insufficient documentation

## 2013-08-27 DIAGNOSIS — IMO0001 Reserved for inherently not codable concepts without codable children: Secondary | ICD-10-CM | POA: Insufficient documentation

## 2013-08-27 DIAGNOSIS — Z880 Allergy status to penicillin: Secondary | ICD-10-CM | POA: Insufficient documentation

## 2013-08-27 DIAGNOSIS — R112 Nausea with vomiting, unspecified: Secondary | ICD-10-CM | POA: Insufficient documentation

## 2013-08-27 LAB — URINALYSIS, ROUTINE W REFLEX MICROSCOPIC
Bilirubin Urine: NEGATIVE
Glucose, UA: NEGATIVE mg/dL
KETONES UR: NEGATIVE mg/dL
LEUKOCYTES UA: NEGATIVE
NITRITE: NEGATIVE
PROTEIN: NEGATIVE mg/dL
Specific Gravity, Urine: 1.01 (ref 1.005–1.030)
Urobilinogen, UA: 0.2 mg/dL (ref 0.0–1.0)
pH: 7 (ref 5.0–8.0)

## 2013-08-27 LAB — URINE MICROSCOPIC-ADD ON

## 2013-08-27 MED ORDER — CYCLOBENZAPRINE HCL 10 MG PO TABS: 10.0000 mg | ORAL_TABLET | Freq: Three times a day (TID) | ORAL | Status: DC | PRN

## 2013-08-27 MED ORDER — NAPROXEN 500 MG PO TABS: 500.0000 mg | ORAL_TABLET | Freq: Two times a day (BID) | ORAL | Status: DC

## 2013-08-27 MED ORDER — KETOROLAC TROMETHAMINE 60 MG/2ML IM SOLN
60.0000 mg | Freq: Once | INTRAMUSCULAR | Status: AC
  Administered 2013-08-27: 60 mg via INTRAMUSCULAR
  Filled 2013-08-27: qty 2

## 2013-08-27 MED ORDER — CYCLOBENZAPRINE HCL 10 MG PO TABS
10.0000 mg | ORAL_TABLET | Freq: Once | ORAL | Status: AC
  Administered 2013-08-27: 10 mg via ORAL
  Filled 2013-08-27: qty 1

## 2013-08-27 NOTE — ED Notes (Signed)
States [facility] with [facility] weeks [facility]

## 2013-08-27 NOTE — Discharge Instructions (Signed)
Try ice and heat to your back where you have muscle soreness. Take the naprosyn and flexeril for pain. Keep your appointment with Dr Emelda FearFerguson to discuss your pain in your right pelvis. I reviewed your CT scan and you do not have any kidney stones and your ultrasound done the same day in January shows normal follicles in your right ovary.

## 2013-08-27 NOTE — ED Notes (Signed)
Complain of pain in right rib/flank area

## 2013-08-27 NOTE — ED Provider Notes (Signed)
CSN: [id number]     Arrival date & time 08/27/13  1514 History  This chart was scribed for Ward Givens, MD by Quintella Reichert, ED scribe.  This patient was seen in room APA14/APA14 and the patient's care was started at 3:43 PM.   Chief Complaint  Patient presents with  . Flank Pain    The history is provided by the patient. No language interpreter was used.    HPI Comments: [name] is a 33 y.o. female with h/o kidney stones who presents to the Emergency Department complaining of right flank pain that began 2-3 days ago.  Pt reports she had similar pain 2 weeks ago and was told that she may have kidney stones that would likely pass on her own, although imaging from that visit is negative for kidney stones.  She states pain is worsened by changing positions.  It is relieved somewhat by heating pad.  She also complains of associated nausea and vomiting.  She states she vomits around 3-4x/day, every time she eats.  She denies diarrhea.  She states that she has had a fever (temperature max 101 F last night).  She denies chills.  Pt also states she has had dysuria for the past 2 days.  She also reports that she sometimes feels "pressure" in her lower abdomen as if she has to urinate  but she is then unable to void. Patient has been having right lower quadrant pain that she feels is from ovarian cysts also for the past few weeks. Pt is a current one-pack-per-day smoker.  She drinks occasionally.  She has 7 children and does not work outside the home.   PCP [facility] Department  GYN is Dr. Emelda Fear   Past Medical History  Diagnosis Date  . Seizures   . Bipolar 1 disorder   . Asthma   . Substance abuse   . Chronic back pain   . Sciatica   . Diverticulitis   . Renal disorder     kidney stones    Past Surgical History  Procedure Laterality Date  . Cholecystectomy    . Abdominal hysterectomy    . Abdominal surgery    . Dilation and curettage of uterus    .  Colonoscopy N/A 11/24/2012    Procedure: COLONOSCOPY;  Surgeon: Malissa Hippo, MD;  Location: [facility];  Service: Endoscopy;  Laterality: N/A;    Family History  Problem Relation Age of Onset  . Cancer Mother     bone  . Asthma Sister   . Asthma Daughter   . Asthma Son   . Diabetes Maternal Grandmother   . Cancer Maternal Grandfather   . Asthma Son   . Asthma Son     History  Substance Use Topics  . Smoking status: Current Every Day Smoker -- 1.00 packs/day    Types: Cigarettes  . Smokeless tobacco: Never Used  . Alcohol Use: No  lives with spouse Has 4 children and takes care of spouses 3 children  OB History   Grav Para Term Preterm Abortions TAB SAB Ect Mult Living                   Review of Systems  Constitutional: Positive for fever. Negative for chills.  Gastrointestinal: Positive for nausea, vomiting and abdominal pain ("pressure"). Negative for diarrhea.  Genitourinary: Positive for dysuria, flank pain and difficulty urinating.  All other systems reviewed and are negative.      Allergies  Black pepper; Coconut flavor; Metronidazole; Penicillins; Rocephin; Propoxyphene n-acetaminophen; and Ultram  Home Medications   Current Outpatient Rx  Name  Route  Sig  Dispense  Refill  . albuterol (PROVENTIL HFA;VENTOLIN HFA) 108 (90 BASE) MCG/ACT inhaler   Inhalation   Inhale 2 puffs into the lungs every 6 (six) hours as needed. Shortness of breath         . carbamazepine (TEGRETOL) 100 MG chewable tablet   Oral   Chew 150 mg by mouth 3 (three) times daily.          Marland Kitchen. gabapentin (NEURONTIN) 300 MG capsule   Oral   Take 300 mg by mouth 3 (three) times daily.           Marland Kitchen. HYDROcodone-acetaminophen (NORCO/VICODIN) 5-325 MG per tablet   Oral   Take 1 tablet by mouth every 6 (six) hours as needed for moderate pain or severe pain.   60 tablet   0     Pt's last Rx this office. Pt being referred to pai ...   . naproxen (NAPROSYN) 500 MG tablet    Oral   Take 1 tablet (500 mg total) by mouth 2 (two) times daily with a meal.   30 tablet   0   . oxyCODONE-acetaminophen (PERCOCET/ROXICET) 5-325 MG per tablet   Oral   Take 2 tablets by mouth every 4 (four) hours as needed for severe pain.   6 tablet   0   . promethazine (PHENERGAN) 25 MG tablet   Oral   Take 1 tablet (25 mg total) by mouth every 6 (six) hours as needed for nausea or vomiting.   12 tablet   0   . sulfamethoxazole-trimethoprim (SEPTRA DS) 800-160 MG per tablet   Oral   Take 1 tablet by mouth 2 (two) times daily.   10 tablet   0   . tamsulosin (FLOMAX) 0.4 MG CAPS capsule   Oral   Take 1 capsule (0.4 mg total) by mouth 2 (two) times daily.   10 capsule   0    BP 107/71  Pulse 85  Temp(Src) 97.8 F (36.6 C) (Oral)  Resp 20  Ht 5\' 8"  (1.727 m)  Wt 145 lb (65.772 kg)  BMI 22.05 kg/m2  SpO2 100%  Vital signs normal    Physical Exam  Nursing note and vitals reviewed. Constitutional: She is oriented to person, place, and time. She appears well-developed and well-nourished.  Non-toxic appearance. She does not appear ill. No distress.  HENT:  Head: Normocephalic and atraumatic.  Right Ear: External ear normal.  Left Ear: External ear normal.  Nose: Nose normal. No mucosal edema or rhinorrhea.  Mouth/Throat: Oropharynx is clear and moist and mucous membranes are normal. No dental abscesses or uvula swelling.  Eyes: Conjunctivae and EOM are normal. Pupils are equal, round, and reactive to light.  Neck: Normal range of motion and full passive range of motion without pain. Neck supple.  Cardiovascular: Normal rate, regular rhythm and normal heart sounds.  Exam reveals no gallop and no friction rub.   No murmur heard. Pulmonary/Chest: Effort normal and breath sounds normal. No respiratory distress. She has no wheezes. She has no rhonchi. She has no rales. She exhibits no tenderness and no crepitus.  Abdominal: Soft. Normal appearance and bowel sounds are  normal. She exhibits no distension. There is no tenderness. There is no rebound and no guarding.  Musculoskeletal: She exhibits tenderness. She exhibits no edema.  Tender to palpation in right flank.  Painful with ROM, sspecially to the right.  Moves all extremities well.   Neurological: She is alert and oriented to person, place, and time. She has normal strength. No cranial nerve deficit.  Skin: Skin is warm, dry and intact. No rash noted. No erythema. No pallor.  Psychiatric: She has a normal mood and affect. Her speech is normal and behavior is normal. Her mood appears not anxious.    ED Course  Procedures (including critical care time)  Medications  ketorolac (TORADOL) injection 60 mg (60 mg Intramuscular Given 08/27/13 1614)  cyclobenzaprine (FLEXERIL) tablet 10 mg (10 mg Oral Given 08/27/13 1613)     DIAGNOSTIC STUDIES: Oxygen Saturation is 100% on room air, normal by my interpretation.    COORDINATION OF CARE: 3:49 PM-Informed pt that symptoms are likely musculoskeletal in origin.  Discussed treatment plan which includes UA with pt at bedside and pt agreed to plan. We went over her CT scan and [place] scan from January.    Review of [place] Database shows she received #62 hydrocodone 3/325 tablets this month from 4 prescriptions and 3 providers.   Results for orders placed during the hospital encounter of 08/27/13  URINALYSIS, ROUTINE W REFLEX MICROSCOPIC      Result Value Ref Range   Color, Urine YELLOW  YELLOW   APPearance HAZY (*) CLEAR   Specific Gravity, Urine 1.010  1.005 - 1.030   pH 7.0  5.0 - 8.0   Glucose, UA NEGATIVE  NEGATIVE mg/dL   Hgb urine dipstick LARGE (*) NEGATIVE   Bilirubin Urine NEGATIVE  NEGATIVE   Ketones, ur NEGATIVE  NEGATIVE mg/dL   Protein, ur NEGATIVE  NEGATIVE mg/dL   Urobilinogen, UA 0.2  0.0 - 1.0 mg/dL   Nitrite NEGATIVE  NEGATIVE   Leukocytes, UA NEGATIVE  NEGATIVE  URINE MICROSCOPIC-ADD ON      Result Value Ref Range   Squamous Epithelial /  LPF RARE  RARE   RBC / HPF TOO NUMEROUS TO COUNT  <3 RBC/hpf   Laboratory interpretation all normal except hematuria (unwitnessed urine sampling)   Dg Abd 1 View  08/10/2013   CLINICAL DATA:  Lower right abdominal and flank pain, vomiting, and hematuria for 3 weeks, history kidney stones and right ovarian cyst  EXAM: ABDOMEN - 1 VIEW  COMPARISON:  02/07/2012  FINDINGS: Surgical clips right upper quadrant likely cholecystectomy.  Normal bowel gas pattern with scattered stool throughout colon.  No bowel dilatation or bowel wall thickening.  No urinary tract calcification identified.  Osseous structures unremarkable.  IMPRESSION: No acute abnormalities.   Electronically Signed   By: Ulyses Southward M.D.   On: 08/10/2013 18:54       CT abdomen/pelvis  CLINICAL DATA: Right flank pain, hematuria, history of  nephrolithiasis   COMPARISON: 11/23/2012   IMPRESSION:  Negative for acute obstructing ureteral calculus, hydronephrosis, or  acute obstructive uropathy  Prior cholecystectomy and hysterectomy  Normal appendix  2.4 cm right ovarian cyst  Minor sigmoid diverticulosis  No acute intra-abdominal or pelvic finding  Electronically Signed  By: Ruel Favors M.D.  On: 07/22/2013 15:06     CLINICAL DATA: Pelvic pain, right ovarian/adnexal cyst. Prior  hysterectomy and patient reports prior left oophorectomy.  EXAM:  TRANSABDOMINAL AND TRANSVAGINAL ULTRASOUND OF PELVIS  DOPPLER ULTRASOUND OF OVARIES  TECHNIQUE:  Both transabdominal and transvaginal ultrasound examinations of the  pelvis were performed. Transabdominal technique was performed for  global imaging of the pelvis including uterus, ovaries, adnexal  regions, and pelvic cul-de-sac.  It was necessary to proceed with endovaginal exam following the  transabdominal exam to visualize the right ovary. Color and duplex  Doppler ultrasound was utilized to evaluate blood flow to the  ovaries.  COMPARISON: CT 07/22/2013  FINDINGS:   Uterus   Surgically absent Left ovary   Pulsed Doppler evaluation of both ovaries demonstrates normal  low-resistance arterial and venous waveforms in the right ovary.  Other findings  No free fluid.  IMPRESSION:  Right ovarian follicles are incidentally noted. No sonographic  evidence for ovarian torsion involving the visualized right ovary.  Patient reports prior left oophorectomy. No further followup is  needed for these benign appearing imaging findings.  Electronically Signed  By: Christiana Pellant M.D.  On: 07/24/2013 17:54    MDM   patient presents with right flank pain that is most consistent with muscular skeletal pain. It hurts to touch and to range of motion. She reports a kidney stone diagnosed recently however her CT scan does not show any renal calculi. She does have some hematuria however patient has an unwitnessed urine sample obtained. She also reports ovarian cyst however she is normal follicles on her only right ovary. Patient has had a ED visits in the past 6 months, this is her seventh ED visits since October for flank/abdominal pain. Patient has a GYN that she already sees and has an appointment to be rechecked in the next couple weeks. She reports she was given hydrocodone for pain but she has run out.    Final diagnoses:  Musculoskeletal pain  Chronic RLQ pain    Discharge Medication List as of 08/27/2013  4:11 PM    START taking these medications   Details  cyclobenzaprine (FLEXERIL) 10 MG tablet Take 1 tablet (10 mg total) by mouth 3 (three) times daily as needed for muscle spasms., Starting 08/27/2013, Until Discontinued, Print    naproxen (NAPROSYN) 500 MG tablet Take 1 tablet (500 mg total) by mouth 2 (two) times daily., Starting 08/27/2013, Until Discontinued, Print        Plan discharge   Devoria Albe, MD, FACEP    I personally performed the services described in this documentation, which was scribed in my presence. The recorded information has been  reviewed and considered.  Devoria Albe, MD, FACEP    Ward Givens, MD 08/27/13 [phone]

## 2013-08-29 ENCOUNTER — Telehealth: Payer: Self-pay | Admitting: Obstetrics and Gynecology

## 2013-08-29 ENCOUNTER — Emergency Department (HOSPITAL_COMMUNITY)
Admission: EM | Admit: 2013-08-29 | Discharge: 2013-08-29 | Disposition: A | Discharge status: Home / Self Care | Payer: Medicaid Other | Attending: Emergency Medicine | Admitting: Emergency Medicine

## 2013-08-29 ENCOUNTER — Encounter (HOSPITAL_COMMUNITY): Payer: Self-pay | Admitting: Emergency Medicine

## 2013-08-29 DIAGNOSIS — G8929 Other chronic pain: Secondary | ICD-10-CM | POA: Insufficient documentation

## 2013-08-29 DIAGNOSIS — F1910 Other psychoactive substance abuse, uncomplicated: Secondary | ICD-10-CM | POA: Insufficient documentation

## 2013-08-29 DIAGNOSIS — M549 Dorsalgia, unspecified: Secondary | ICD-10-CM | POA: Insufficient documentation

## 2013-08-29 DIAGNOSIS — Z885 Allergy status to narcotic agent status: Secondary | ICD-10-CM | POA: Insufficient documentation

## 2013-08-29 DIAGNOSIS — F17200 Nicotine dependence, unspecified, uncomplicated: Secondary | ICD-10-CM | POA: Insufficient documentation

## 2013-08-29 DIAGNOSIS — J45909 Unspecified asthma, uncomplicated: Secondary | ICD-10-CM | POA: Insufficient documentation

## 2013-08-29 DIAGNOSIS — Z880 Allergy status to penicillin: Secondary | ICD-10-CM | POA: Insufficient documentation

## 2013-08-29 DIAGNOSIS — N83209 Unspecified ovarian cyst, unspecified side: Secondary | ICD-10-CM | POA: Insufficient documentation

## 2013-08-29 DIAGNOSIS — Z79899 Other long term (current) drug therapy: Secondary | ICD-10-CM | POA: Insufficient documentation

## 2013-08-29 DIAGNOSIS — Z87442 Personal history of urinary calculi: Secondary | ICD-10-CM | POA: Insufficient documentation

## 2013-08-29 DIAGNOSIS — K5732 Diverticulitis of large intestine without perforation or abscess without bleeding: Secondary | ICD-10-CM | POA: Insufficient documentation

## 2013-08-29 DIAGNOSIS — F319 Bipolar disorder, unspecified: Secondary | ICD-10-CM | POA: Insufficient documentation

## 2013-08-29 DIAGNOSIS — G40909 Epilepsy, unspecified, not intractable, without status epilepticus: Secondary | ICD-10-CM | POA: Insufficient documentation

## 2013-08-29 DIAGNOSIS — Z888 Allergy status to other drugs, medicaments and biological substances status: Secondary | ICD-10-CM | POA: Insufficient documentation

## 2013-08-29 DIAGNOSIS — Z881 Allergy status to other antibiotic agents status: Secondary | ICD-10-CM | POA: Insufficient documentation

## 2013-08-29 DIAGNOSIS — M5430 Sciatica, unspecified side: Secondary | ICD-10-CM | POA: Insufficient documentation

## 2013-08-29 DIAGNOSIS — Z886 Allergy status to analgesic agent status: Secondary | ICD-10-CM | POA: Insufficient documentation

## 2013-08-29 DIAGNOSIS — F1110 Opioid abuse, uncomplicated: Secondary | ICD-10-CM | POA: Insufficient documentation

## 2013-08-29 LAB — CBC WITH DIFFERENTIAL/PLATELET
BASOS ABS: 0 10*3/uL (ref 0.0–0.1)
Basophils Relative: 0 % (ref 0–1)
EOS PCT: 2 % (ref 0–5)
Eosinophils Absolute: 0.1 10*3/uL (ref 0.0–0.7)
HCT: 37.1 % (ref 36.0–46.0)
Hemoglobin: 12.9 g/dL (ref 12.0–15.0)
Lymphocytes Relative: 25 % (ref 12–46)
Lymphs Abs: 1.7 10*3/uL (ref 0.7–4.0)
MCH: 33.9 pg (ref 26.0–34.0)
MCHC: 34.8 g/dL (ref 30.0–36.0)
MCV: 97.6 fL (ref 78.0–100.0)
Monocytes Absolute: 0.4 10*3/uL (ref 0.1–1.0)
Monocytes Relative: 6 % (ref 3–12)
Neutro Abs: 4.5 10*3/uL (ref 1.7–7.7)
Neutrophils Relative %: 67 % (ref 43–77)
PLATELETS: 214 10*3/uL (ref 150–400)
RBC: 3.8 MIL/uL — ABNORMAL LOW (ref 3.87–5.11)
RDW: 14.8 % (ref 11.5–15.5)
WBC: 6.7 10*3/uL (ref 4.0–10.5)

## 2013-08-29 LAB — URINALYSIS, ROUTINE W REFLEX MICROSCOPIC
Bilirubin Urine: NEGATIVE
GLUCOSE, UA: NEGATIVE mg/dL
Hgb urine dipstick: NEGATIVE
KETONES UR: NEGATIVE mg/dL
Nitrite: NEGATIVE
PH: 6.5 (ref 5.0–8.0)
Protein, ur: NEGATIVE mg/dL
Specific Gravity, Urine: 1.012 (ref 1.005–1.030)
Urobilinogen, UA: 0.2 mg/dL (ref 0.0–1.0)

## 2013-08-29 LAB — COMPREHENSIVE METABOLIC PANEL
ALBUMIN: 3.6 g/dL (ref 3.5–5.2)
ALT: 8 U/L (ref 0–35)
AST: 14 U/L (ref 0–37)
Alkaline Phosphatase: 84 U/L (ref 39–117)
BUN: 4 mg/dL — ABNORMAL LOW (ref 6–23)
CALCIUM: 9.3 mg/dL (ref 8.4–10.5)
CO2: 21 mEq/L (ref 19–32)
CREATININE: 0.6 mg/dL (ref 0.50–1.10)
Chloride: 104 mEq/L (ref 96–112)
GFR calc Af Amer: >90 mL/min (ref >90)
GFR calc non Af Amer: >90 mL/min (ref >90)
Glucose, Bld: 80 mg/dL (ref 70–99)
Potassium: 3.9 mEq/L (ref 3.7–5.3)
SODIUM: 138 meq/L (ref 137–147)
TOTAL PROTEIN: 6.8 g/dL (ref 6.0–8.3)
Total Bilirubin: 0.2 mg/dL — ABNORMAL LOW (ref 0.3–1.2)

## 2013-08-29 LAB — URINE MICROSCOPIC-ADD ON

## 2013-08-29 LAB — LACTIC ACID, PLASMA: LACTIC ACID, VENOUS: 0.9 mmol/L (ref 0.5–2.2)

## 2013-08-29 MED ORDER — KETOROLAC TROMETHAMINE 60 MG/2ML IM SOLN
60.0000 mg | Freq: Once | INTRAMUSCULAR | Status: AC
  Administered 2013-08-29: 60 mg via INTRAMUSCULAR
  Filled 2013-08-29: qty 2

## 2013-08-29 MED ORDER — HYDROMORPHONE HCL PF 1 MG/ML IJ SOLN
1.0000 mg | Freq: Once | INTRAMUSCULAR | Status: AC
  Administered 2013-08-29: 1 mg via INTRAMUSCULAR
  Filled 2013-08-29: qty 1

## 2013-08-29 MED ORDER — NORGESTIMATE-ETH ESTRADIOL 0.25-35 MG-MCG PO TABS: 1.0000 | ORAL_TABLET | Freq: Every day | ORAL | Status: DC

## 2013-08-29 MED ORDER — ONDANSETRON HCL 4 MG/2ML IJ SOLN: 4.0000 mg | Freq: Once | INTRAMUSCULAR | Status: DC

## 2013-08-29 MED ORDER — NORGESTIM-ETH ESTRAD TRIPHASIC 0.18/0.215/0.25 MG-25 MCG PO TABS: ORAL_TABLET | ORAL | Status: DC

## 2013-08-29 MED ORDER — HYDROMORPHONE HCL PF 1 MG/ML IJ SOLN: 1.0000 mg | Freq: Once | INTRAMUSCULAR | Status: DC

## 2013-08-29 MED ORDER — KETOROLAC TROMETHAMINE 30 MG/ML IJ SOLN: 30.0000 mg | Freq: Once | INTRAMUSCULAR | Status: DC

## 2013-08-29 NOTE — Discharge Instructions (Signed)
Ovarian Cyst °An ovarian cyst is a sac filled with fluid or blood. This sac is attached to the ovary. Some cysts go away on their own. Other cysts need treatment.  °HOME CARE  °· Only take medicine as told by your doctor. °· Follow up with your doctor as told. °· Get regular pelvic exams and Pap tests. °GET HELP IF: °· Your periods are late, not regular, or painful. °· You stop having periods. °· Your belly (abdominal) or pelvic pain does not go away. °· Your belly becomes large or puffy (swollen). °· You have a hard time peeing (totally emptying your bladder). °· You have pressure on your bladder. °· You have pain during sex. °· You feel fullness, pressure, or discomfort in your belly. °· You lose weight for no reason. °· You feel sick most of the time. °· You have a hard time pooping (constipation). °· You do not feel like eating. °· You develop pimples (acne). °· You have an increase in hair on your body and face. °· You are gaining weight for no reason. °· You think you are pregnant. °GET HELP RIGHT AWAY IF:  °· Your belly pain gets worse. °· You feel sick to your stomach (nauseous), and you throw up (vomit). °· You have a fever that comes on fast. °· You have belly pain while pooping (bowel movement). °· Your periods are heavier than usual. °MAKE SURE YOU:  °· Understand these instructions. °· Will watch your condition. °· Will get help right away if you are not doing well or get worse. °Document Released: 12/03/2007 Document Revised: 04/06/2013 Document Reviewed: 02/21/2013 °[employer]® Patient Information ©2014 [employer]. ° °

## 2013-08-29 NOTE — ED Notes (Signed)
Patient with recent dx of ovarian cyst.  Patient states she has had increasing pain for 2 days.  Patient states she cannot keep anything down.  Patient has not taken anything for pain.

## 2013-08-29 NOTE — ED Notes (Signed)
Pt states she saw her OB for right lower quadrant pain and known right ovarian cyst. Pt states that her doctor refuses to take out her right ovary due to her age and does not want her on hormone for the rest of her life. Pt states that she also has a hx of kidney stones. Pt denies vaginal discharge or vaginal bleeding.

## 2013-08-29 NOTE — ED Provider Notes (Signed)
CSN: [id number]     Arrival date & time 08/29/13  1047 History   First MD Initiated Contact with Patient 08/29/13 1219     Chief Complaint  Patient presents with  . Abdominal Pain     HPI Patient with recent dx of ovarian cyst. Patient states she has had increasing pain for 2 days. Patient states she cannot keep anything down. Patient has not taken anything for pain.  Patient had multiple ER visits in the past year for similar complaints.  Said multiple CT scans the abdomen.  Is followed by Dr. Emelda FearFerguson in [place] for an ovary and cyst.  Review of his notes reveals patient has history of opioid abuse and he is referred her to a pain management clinic.  He wrote her prescription for one month's supply total number of 60 Norco approximately a week ago.  He had redness notes he was going to try some birth control but she says she never got the prescription.  I asked her about pain management referral but she says they never referred her.  She denies any fever chills.  Past Medical History  Diagnosis Date  . Seizures   . Bipolar 1 disorder   . Asthma   . Substance abuse   . Chronic back pain   . Sciatica   . Diverticulitis   . Renal disorder     kidney stones   Past Surgical History  Procedure Laterality Date  . Cholecystectomy    . Abdominal hysterectomy    . Abdominal surgery    . Dilation and curettage of uterus    . Colonoscopy N/A 11/24/2012    Procedure: COLONOSCOPY;  Surgeon: Malissa HippoNajeeb U Rehman, MD;  Location: [facility];  Service: Endoscopy;  Laterality: N/A;   Family History  Problem Relation Age of Onset  . Cancer Mother     bone  . Asthma Sister   . Asthma Daughter   . Asthma Son   . Diabetes Maternal Grandmother   . Cancer Maternal Grandfather   . Asthma Son   . Asthma Son    History  Substance Use Topics  . Smoking status: Current Every Day Smoker -- 1.00 packs/day    Types: Cigarettes  . Smokeless tobacco: Never Used  . Alcohol Use: No   OB History   Grav Para Term Preterm Abortions TAB SAB Ect Mult Living                 Review of Systems  All other systems reviewed and are negative  Allergies  Black pepper; Coconut flavor; Metronidazole; Penicillins; Rocephin; Propoxyphene n-acetaminophen; and Ultram  Home Medications   Current Outpatient Rx  Name  Route  Sig  Dispense  Refill  . albuterol (PROVENTIL HFA;VENTOLIN HFA) 108 (90 BASE) MCG/ACT inhaler   Inhalation   Inhale 2 puffs into the lungs every 6 (six) hours as needed. Shortness of breath         . carbamazepine (TEGRETOL) 100 MG chewable tablet   Oral   Chew 150 mg by mouth 3 (three) times daily.          . cyclobenzaprine (FLEXERIL) 10 MG tablet   Oral   Take 1 tablet (10 mg total) by mouth 3 (three) times daily as needed for muscle spasms.   30 tablet   0   . gabapentin (NEURONTIN) 300 MG capsule   Oral   Take 300 mg by mouth 3 (three) times daily.           .Marland Kitchen  naproxen (NAPROSYN) 500 MG tablet   Oral   Take 1 tablet (500 mg total) by mouth 2 (two) times daily.   30 tablet   0   . norgestimate-ethinyl estradiol (ORTHO-CYCLEN,SPRINTEC,PREVIFEM) 0.25-35 MG-MCG tablet   Oral   Take 1 tablet by mouth daily.   1 Package   11   . Norgestimate-Ethinyl Estradiol Triphasic 0.18/0.215/0.25 MG-25 MCG tab      1 hormonally active tab x21 d followed by inert tab x7 d; repeat cycle   30 Package   3    BP 106/50  Pulse 86  Temp(Src) 97.9 F (36.6 C) (Oral)  Resp 18  Wt 145 lb (65.772 kg)  SpO2 98% Physical Exam  Nursing note and vitals reviewed. Constitutional: She is oriented to person, place, and time. She appears well-developed and well-nourished. No distress.  HENT:  Head: Normocephalic and atraumatic.  Eyes: Pupils are equal, round, and reactive to light.  Neck: Normal range of motion.  Cardiovascular: Normal rate and intact distal pulses.   Pulmonary/Chest: No respiratory distress.  Abdominal: Normal appearance. She exhibits no distension.  There is no tenderness. There is no rebound and no guarding.  Musculoskeletal: Normal range of motion.  Neurological: She is alert and oriented to person, place, and time. No cranial nerve deficit.  Skin: Skin is warm and dry. No rash noted.  Psychiatric: She has a normal mood and affect. Her behavior is normal.    ED Course  Procedures (including critical care time)  Medications  HYDROmorphone (DILAUDID) injection 1 mg (not administered)  ketorolac (TORADOL) 30 MG/ML injection 30 mg (not administered)  ondansetron (ZOFRAN) injection 4 mg (not administered)    Labs Review Labs Reviewed  URINALYSIS, ROUTINE W REFLEX MICROSCOPIC - Abnormal; Notable for the following:    APPearance HAZY (*)    Leukocytes, UA TRACE (*)    All other components within normal limits  CBC WITH DIFFERENTIAL - Abnormal; Notable for the following:    RBC 3.80 (*)    All other components within normal limits  COMPREHENSIVE METABOLIC PANEL - Abnormal; Notable for the following:    BUN 4 (*)    Total Bilirubin 0.2 (*)    All other components within normal limits  LACTIC ACID, PLASMA  URINE MICROSCOPIC-ADD ON   Imaging Review     MDM   Final diagnoses:  None        Veronica Shi, MD 08/29/13 1414

## 2013-08-29 NOTE — Telephone Encounter (Signed)
PATIENT SEEN AGAIN IN ED FOR ALLEGED PELVIC PAIN, PLACED ON ORAL CONTRACEPTIVES TO REDUCE OVARIAN FUNCTION TO REDUCE THE POSSIBILITY OF OVARIAN FUNCTION CONTRIBUTING TO ALLEGED PAIN.  PT HAS BEEN TO PAIN CLINICS IN THE PAST, AND FULLY KNOWS HOW TO INITIATE CONTACT WITH PAIN CLINIC.   PT ADVISED THAT SHE SHOULD SELECT PAIN CLINIC, AND THAT IF REFERRAL NEEDED , THAT I WILL FACILITATE BY [facility].  THE RESPONSIBILITY FOR SEEKING PAIN CLINIC RESTS WITH THE PATIENT.

## 2013-09-19 ENCOUNTER — Telehealth: Payer: Self-pay | Admitting: Obstetrics and Gynecology

## 2013-09-19 NOTE — Telephone Encounter (Signed)
Pt was sent to pain clinic and wants to get enough pain medication to last until she goes to the pain clinic in 3 weeks. Pt wanted to know if Dr. Emelda FearFerguson could give her the pain medication.  I spoke with Dr. Emelda FearFerguson and he advised that he would no longer give the pt any pain medication even if she made an appointment. He would only see hedr for GYN issues. I advised the pt of this and she verbalized understanding.

## 2013-10-31 ENCOUNTER — Ambulatory Visit: Payer: Medicaid Other | Admitting: Obstetrics and Gynecology

## 2013-11-01 ENCOUNTER — Encounter: Payer: Self-pay | Admitting: *Deleted

## 2014-04-25 ENCOUNTER — Emergency Department (HOSPITAL_COMMUNITY)
Admission: EM | Admit: 2014-04-25 | Discharge: 2014-04-25 | Disposition: A | Discharge status: Home / Self Care | Payer: Medicaid Other | Attending: Emergency Medicine | Admitting: Emergency Medicine

## 2014-04-25 ENCOUNTER — Encounter (HOSPITAL_COMMUNITY): Payer: Self-pay | Admitting: Emergency Medicine

## 2014-04-25 DIAGNOSIS — J45909 Unspecified asthma, uncomplicated: Secondary | ICD-10-CM | POA: Diagnosis not present

## 2014-04-25 DIAGNOSIS — W57XXXA Bitten or stung by nonvenomous insect and other nonvenomous arthropods, initial encounter: Secondary | ICD-10-CM | POA: Diagnosis not present

## 2014-04-25 DIAGNOSIS — L089 Local infection of the skin and subcutaneous tissue, unspecified: Secondary | ICD-10-CM

## 2014-04-25 DIAGNOSIS — G40909 Epilepsy, unspecified, not intractable, without status epilepticus: Secondary | ICD-10-CM | POA: Insufficient documentation

## 2014-04-25 DIAGNOSIS — F319 Bipolar disorder, unspecified: Secondary | ICD-10-CM | POA: Insufficient documentation

## 2014-04-25 DIAGNOSIS — Z880 Allergy status to penicillin: Secondary | ICD-10-CM | POA: Insufficient documentation

## 2014-04-25 DIAGNOSIS — G8929 Other chronic pain: Secondary | ICD-10-CM | POA: Diagnosis not present

## 2014-04-25 DIAGNOSIS — L0889 Other specified local infections of the skin and subcutaneous tissue: Secondary | ICD-10-CM | POA: Insufficient documentation

## 2014-04-25 DIAGNOSIS — S80862A Insect bite (nonvenomous), left lower leg, initial encounter: Secondary | ICD-10-CM | POA: Insufficient documentation

## 2014-04-25 DIAGNOSIS — Z791 Long term (current) use of non-steroidal anti-inflammatories (NSAID): Secondary | ICD-10-CM | POA: Diagnosis not present

## 2014-04-25 DIAGNOSIS — Z87442 Personal history of urinary calculi: Secondary | ICD-10-CM | POA: Insufficient documentation

## 2014-04-25 DIAGNOSIS — Z720 Tobacco use: Secondary | ICD-10-CM | POA: Diagnosis not present

## 2014-04-25 DIAGNOSIS — Z8719 Personal history of other diseases of the digestive system: Secondary | ICD-10-CM | POA: Insufficient documentation

## 2014-04-25 DIAGNOSIS — Z79899 Other long term (current) drug therapy: Secondary | ICD-10-CM | POA: Diagnosis not present

## 2014-04-25 DIAGNOSIS — Z8739 Personal history of other diseases of the musculoskeletal system and connective tissue: Secondary | ICD-10-CM | POA: Insufficient documentation

## 2014-04-25 DIAGNOSIS — Y929 Unspecified place or not applicable: Secondary | ICD-10-CM | POA: Diagnosis not present

## 2014-04-25 DIAGNOSIS — Y939 Activity, unspecified: Secondary | ICD-10-CM | POA: Insufficient documentation

## 2014-04-25 DIAGNOSIS — Z8619 Personal history of other infectious and parasitic diseases: Secondary | ICD-10-CM | POA: Insufficient documentation

## 2014-04-25 MED ORDER — SULFAMETHOXAZOLE-TRIMETHOPRIM 800-160 MG PO TABS: 1.0000 | ORAL_TABLET | Freq: Two times a day (BID) | ORAL | Status: DC

## 2014-04-25 MED ORDER — SULFAMETHOXAZOLE-TRIMETHOPRIM 200-40 MG/5ML PO SUSP: 20.0000 mL | Freq: Once | ORAL | Status: DC

## 2014-04-25 NOTE — ED Provider Notes (Signed)
CSN: [id number]     Arrival date & time 04/25/14  1452 History   First MD Initiated Contact with Patient 04/25/14 1856     Chief Complaint  Patient presents with  . insect bite      (Consider location/radiation/quality/duration/timing/severity/associated sxs/prior Treatment) Patient is a 33 y.o. female presenting with leg pain. The history is provided by the patient. No language interpreter was used.  Leg Pain Location:  Leg Time since incident:  1 day Injury: no   Leg location:  L leg Pain details:    Quality:  Aching   Severity:  Severe   Timing:  Constant Worsened by:  Nothing tried Ineffective treatments:  None tried Associated symptoms: swelling   Pt reports she was bitten by a brown spider.   Pt reports she saw a spider bite her.  (brown spider)  Pt complains of redness around area  Past Medical History  Diagnosis Date  . Seizures   . Bipolar 1 disorder   . Asthma   . Substance abuse   . Chronic back pain   . Sciatica   . Diverticulitis   . Renal disorder     kidney stones   Past Surgical History  Procedure Laterality Date  . Cholecystectomy    . Abdominal hysterectomy    . Abdominal surgery    . Dilation and curettage of uterus    . Colonoscopy N/A 11/24/2012    Procedure: COLONOSCOPY;  Surgeon: Malissa HippoNajeeb U Rehman, MD;  [facility]: [facility];  Service: Endoscopy;  Laterality: N/A;  . Mandible surgery     Family History  Problem Relation Age of Onset  . Cancer Mother     bone  . Asthma Sister   . Asthma Daughter   . Asthma Son   . Diabetes Maternal Grandmother   . Cancer Maternal Grandfather   . Asthma Son   . Asthma Son    History  Substance Use Topics  . Smoking status: Current Every Day Smoker -- 1.00 packs/day    Types: Cigarettes  . Smokeless tobacco: Never Used  . Alcohol Use: 0.0 oz/week     Comment: occ   OB History   Grav Para Term Preterm Abortions TAB SAB Ect Mult Living                 Review of Systems  Skin: Positive for  wound.  All other systems reviewed and are negative.     Allergies  Black pepper; Coconut flavor; Metronidazole; Penicillins; Rocephin; Propoxyphene n-acetaminophen; Ultram; and Motrin  Home Medications   Prior to Admission medications   Medication Sig Start Date End Date Taking? Authorizing Provider  albuterol (PROVENTIL HFA;VENTOLIN HFA) 108 (90 BASE) MCG/ACT inhaler Inhale 2 puffs into the lungs every 6 (six) hours as needed. Shortness of breath    Historical Provider, MD  carbamazepine (TEGRETOL) 100 MG chewable tablet Chew 150 mg by mouth 3 (three) times daily.     Historical Provider, MD  cyclobenzaprine (FLEXERIL) 10 MG tablet Take 1 tablet (10 mg total) by mouth 3 (three) times daily as needed for muscle spasms. 08/27/13   Ward GivensIva L Knapp, MD  gabapentin (NEURONTIN) 300 MG capsule Take 300 mg by mouth 3 (three) times daily.      Historical Provider, MD  naproxen (NAPROSYN) 500 MG tablet Take 1 tablet (500 mg total) by mouth 2 (two) times daily. 08/27/13   Ward GivensIva L Knapp, MD  norgestimate-ethinyl estradiol (ORTHO-CYCLEN,SPRINTEC,PREVIFEM) 0.25-35 MG-MCG tablet Take 1 tablet by mouth daily. 08/29/13  Nelia Shiobert L Beaton, MD  Norgestimate-Ethinyl Estradiol Triphasic 0.18/0.215/0.25 MG-25 MCG tab 1 hormonally active tab x21 d followed by inert tab x7 d; repeat cycle 08/29/13   Nelia Shiobert L Beaton, MD  sulfamethoxazole-trimethoprim (SEPTRA DS) 800-160 MG per tablet Take 1 tablet by mouth every 12 (twelve) hours. 04/25/14   Elson AreasLeslie K Breaunna Gottlieb, PA-C   BP 111/65  Pulse 81  Temp(Src) 98.6 F (37 C) (Oral)  Resp 18  Ht 5\' 8"  (1.727 m)  Wt 135 lb (61.236 kg)  BMI 20.53 kg/m2  SpO2 99% Physical Exam  Nursing note and vitals reviewed. Constitutional: She is oriented to person, place, and time. She appears well-developed and well-nourished.  Musculoskeletal: She exhibits tenderness.  Small 2mm wound center of 10cm area of erythema.  Warm to touch  Neurological: She is alert and oriented to person, place, and  time. She has normal reflexes.  Skin: There is erythema.  Psychiatric: She has a normal mood and affect.    ED Course  Procedures (including critical care time) Labs Review Labs Reviewed - No data to display  Imaging Review No results found.   EKG Interpretation None      MDM  Pt has had a staph infection in the past.  I will cover with Bactrim.   Pt has history of narcotic abuse noted.  Pt reports her MD gives her Hydrocodone but she is out.  (Pt advised she needs to discuss pain medication with her MD)     Final diagnoses:  Skin infection    Bactrim DS, Warm compresses tylenol    Elson AreasLeslie K Donyea Gafford, PA-C 04/25/14 1957

## 2014-04-25 NOTE — ED Notes (Addendum)
Pt reports was bit by an insect  on her left leg this morning, c/o nausea, vomited x 3 and c/o feeling lightheaded.

## 2014-04-25 NOTE — Discharge Instructions (Signed)
Wound Infection °A wound infection happens when a type of germ (bacteria) starts growing in the wound. In some cases, this can cause the wound to break open. If cared for properly, the infected wound will heal from the inside to the outside. Wound infections need treatment. °CAUSES °An infection is caused by bacteria growing in the wound.  °SYMPTOMS  °· Increase in redness, swelling, or pain at the wound site. °· Increase in drainage at the wound site. °· Wound or bandage (dressing) starts to smell bad. °· Fever. °· Feeling tired or fatigued. °· Pus draining from the wound. °TREATMENT  °Your health care provider will prescribe antibiotic medicine. The wound infection should improve within 24 to 48 hours. Any redness around the wound should stop spreading and the wound should be less painful.  °HOME CARE INSTRUCTIONS  °· Only take over-the-counter or prescription medicines for pain, discomfort, or fever as directed by your health care provider. °· Take your antibiotics as directed. Finish them even if you start to feel better. °· Gently wash the area with mild soap and water 2 times a day, or as directed. Rinse off the soap. Pat the area dry with a clean towel. Do not rub the wound. This may cause bleeding. °· Follow your health care provider's instructions for how often you need to change the dressing. °· Apply ointment and a dressing to the wound as directed. °· If the dressing sticks, moisten it with soapy water and gently remove it. °· Change the bandage right away if it becomes wet, dirty, or develops a bad smell. °· Take showers. Do not take tub baths, swim, or do anything that may soak the wound until it is healed. °· Avoid exercises that make you sweat heavily. °· Use anti-itch medicine as directed by your health care provider. The wound may itch when it is healing. Do not pick or scratch at the wound. °· Follow up with your health care provider to get your wound rechecked as directed. °SEEK MEDICAL CARE  IF: °· You have an increase in swelling, pain, or redness around the wound. °· You have an increase in the amount of pus coming from the wound. °· There is a bad smell coming from the wound. °· More of the wound breaks open. °· You have a fever. °MAKE SURE YOU:  °· Understand these instructions. °· Will watch your condition. °· Will get help right away if you are not doing well or get worse. °Document Released: 03/15/2003 Document Revised: 06/21/2013 Document Reviewed: 10/20/2010 °ExitCare® Patient Information ©2015 [employer]. This information is not intended to replace advice given to you by your health care provider. Make sure you discuss any questions you have with your health care provider. ° °
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
## 2014-04-26 NOTE — ED Provider Notes (Signed)
Medical screening examination/treatment/procedure(s) were performed by non-physician practitioner and as supervising physician I was immediately available for consultation/collaboration.   EKG Interpretation None       Jaala Bohle, MD 04/26/14 1122 

## 2014-07-17 ENCOUNTER — Emergency Department (HOSPITAL_COMMUNITY): Payer: Medicaid Other

## 2014-07-17 ENCOUNTER — Emergency Department (HOSPITAL_COMMUNITY)
Admission: EM | Admit: 2014-07-17 | Discharge: 2014-07-17 | Disposition: A | Discharge status: Home / Self Care | Payer: Medicaid Other | Attending: Emergency Medicine | Admitting: Emergency Medicine

## 2014-07-17 ENCOUNTER — Encounter (HOSPITAL_COMMUNITY): Payer: Self-pay | Admitting: *Deleted

## 2014-07-17 DIAGNOSIS — F319 Bipolar disorder, unspecified: Secondary | ICD-10-CM | POA: Diagnosis not present

## 2014-07-17 DIAGNOSIS — Y998 Other external cause status: Secondary | ICD-10-CM | POA: Diagnosis not present

## 2014-07-17 DIAGNOSIS — S39012A Strain of muscle, fascia and tendon of lower back, initial encounter: Secondary | ICD-10-CM | POA: Diagnosis not present

## 2014-07-17 DIAGNOSIS — Z8719 Personal history of other diseases of the digestive system: Secondary | ICD-10-CM | POA: Diagnosis not present

## 2014-07-17 DIAGNOSIS — Z792 Long term (current) use of antibiotics: Secondary | ICD-10-CM | POA: Diagnosis not present

## 2014-07-17 DIAGNOSIS — Z880 Allergy status to penicillin: Secondary | ICD-10-CM | POA: Diagnosis not present

## 2014-07-17 DIAGNOSIS — S4991XA Unspecified injury of right shoulder and upper arm, initial encounter: Secondary | ICD-10-CM | POA: Diagnosis not present

## 2014-07-17 DIAGNOSIS — Z720 Tobacco use: Secondary | ICD-10-CM | POA: Diagnosis not present

## 2014-07-17 DIAGNOSIS — S20211A Contusion of right front wall of thorax, initial encounter: Secondary | ICD-10-CM | POA: Diagnosis not present

## 2014-07-17 DIAGNOSIS — Z791 Long term (current) use of non-steroidal anti-inflammatories (NSAID): Secondary | ICD-10-CM | POA: Diagnosis not present

## 2014-07-17 DIAGNOSIS — Z793 Long term (current) use of hormonal contraceptives: Secondary | ICD-10-CM | POA: Diagnosis not present

## 2014-07-17 DIAGNOSIS — J45909 Unspecified asthma, uncomplicated: Secondary | ICD-10-CM | POA: Insufficient documentation

## 2014-07-17 DIAGNOSIS — Z79899 Other long term (current) drug therapy: Secondary | ICD-10-CM | POA: Insufficient documentation

## 2014-07-17 DIAGNOSIS — Y92410 Unspecified street and highway as the place of occurrence of the external cause: Secondary | ICD-10-CM | POA: Insufficient documentation

## 2014-07-17 DIAGNOSIS — Z87442 Personal history of urinary calculi: Secondary | ICD-10-CM | POA: Insufficient documentation

## 2014-07-17 DIAGNOSIS — Z8739 Personal history of other diseases of the musculoskeletal system and connective tissue: Secondary | ICD-10-CM | POA: Diagnosis not present

## 2014-07-17 DIAGNOSIS — Y9389 Activity, other specified: Secondary | ICD-10-CM | POA: Diagnosis not present

## 2014-07-17 DIAGNOSIS — G40909 Epilepsy, unspecified, not intractable, without status epilepticus: Secondary | ICD-10-CM | POA: Diagnosis not present

## 2014-07-17 DIAGNOSIS — G8929 Other chronic pain: Secondary | ICD-10-CM | POA: Diagnosis not present

## 2014-07-17 DIAGNOSIS — S3992XA Unspecified injury of lower back, initial encounter: Secondary | ICD-10-CM | POA: Diagnosis present

## 2014-07-17 MED ORDER — HYDROCODONE-ACETAMINOPHEN 5-325 MG PO TABS: ORAL_TABLET | ORAL | Status: DC

## 2014-07-17 MED ORDER — CYCLOBENZAPRINE HCL 10 MG PO TABS
10.0000 mg | ORAL_TABLET | Freq: Once | ORAL | Status: AC
  Administered 2014-07-17: 10 mg via ORAL
  Filled 2014-07-17: qty 1

## 2014-07-17 MED ORDER — HYDROCODONE-ACETAMINOPHEN 5-325 MG PO TABS
1.0000 | ORAL_TABLET | Freq: Once | ORAL | Status: AC
  Administered 2014-07-17: 1 via ORAL
  Filled 2014-07-17: qty 1

## 2014-07-17 MED ORDER — CYCLOBENZAPRINE HCL 10 MG PO TABS: 10.0000 mg | ORAL_TABLET | Freq: Three times a day (TID) | ORAL | Status: DC | PRN
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
## 2014-07-17 NOTE — Discharge Instructions (Signed)
Chest Contusion A contusion is a deep bruise. Bruises happen when an injury causes bleeding under the skin. Signs of bruising include pain, puffiness (swelling), and discolored skin. The bruise may turn blue, purple, or yellow.  HOME CARE  Put ice on the injured area.  Put ice in a plastic bag.  Place a towel between the skin and the bag.  Leave the ice on for 15-20 minutes at a time, 03-04 times a day for the first 48 hours.  Only take medicine as told by your doctor.  Rest.  Take deep breaths (deep-breathing exercises) as told by your doctor.  Stop smoking if you smoke.  Do not lift objects over 5 pounds (2.3 kilograms) for 3 days or longer if told by your doctor. GET HELP RIGHT AWAY IF:   You have more bruising or puffiness.  You have pain that gets worse.  You have trouble breathing.  You are dizzy, weak, or pass out (faint).  You have blood in your pee (urine) or poop (stool).  You cough up or throw up (vomit) blood.  Your puffiness or pain is not helped with medicines. MAKE SURE YOU:   Understand these instructions.  Will watch your condition.  Will get help right away if you are not doing well or get worse. Document Released: 12/03/2007 Document Revised: 03/10/2012 Document Reviewed: 12/08/2011 [facility] Patient Information 2015 [place], [place]. This information is not intended to replace advice given to you by your health care provider. Make sure you discuss any questions you have with your health care provider.  Lumbosacral Strain Lumbosacral strain is a strain of any of the parts that make up your lumbosacral vertebrae. Your lumbosacral vertebrae are the bones that make up the lower third of your backbone. Your lumbosacral vertebrae are held together by muscles and tough, fibrous tissue (ligaments).  CAUSES  A sudden blow to your back can cause lumbosacral strain. Also, anything that causes an excessive stretch of the muscles in the low back can cause this  strain. This is typically seen when people exert themselves strenuously, fall, lift heavy objects, bend, or crouch repeatedly. RISK FACTORS  Physically demanding work.  Participation in pushing or pulling sports or sports that require a sudden twist of the back (tennis, golf, baseball).  Weight lifting.  Excessive lower back curvature.  Forward-tilted pelvis.  Weak back or abdominal muscles or both.  Tight hamstrings. SIGNS AND SYMPTOMS  Lumbosacral strain may cause pain in the area of your injury or pain that moves (radiates) down your leg.  DIAGNOSIS Your health care provider can often diagnose lumbosacral strain through a physical exam. In some cases, you may need tests such as X-ray exams.  TREATMENT  Treatment for your lower back injury depends on many factors that your clinician will have to evaluate. However, most treatment will include the use of anti-inflammatory medicines. HOME CARE INSTRUCTIONS   Avoid hard physical activities (tennis, racquetball, waterskiing) if you are not in proper physical condition for it. This may aggravate or create problems.  If you have a back problem, avoid sports requiring sudden body movements. Swimming and walking are generally safer activities.  Maintain good posture.  Maintain a healthy weight.  For acute conditions, you may put ice on the injured area.  Put ice in a plastic bag.  Place a towel between your skin and the bag.  Leave the ice on for 20 minutes, 2-3 times a day.  When the low back starts healing, stretching and strengthening exercises may  be recommended. SEEK MEDICAL CARE IF:  Your back pain is getting worse.  You experience severe back pain not relieved with medicines. SEEK IMMEDIATE MEDICAL CARE IF:   You have numbness, tingling, weakness, or problems with the use of your arms or legs.  There is a change in bowel or bladder control.  You have increasing pain in any area of the body, including your belly  (abdomen).  You notice shortness of breath, dizziness, or feel faint.  You feel sick to your stomach (nauseous), are throwing up (vomiting), or become sweaty.  You notice discoloration of your toes or legs, or your feet get very cold. MAKE SURE YOU:   Understand these instructions.  Will watch your condition.  Will get help right away if you are not doing well or get worse. Document Released: 03/26/2005 Document Revised: 06/21/2013 Document Reviewed: 02/02/2013 [facility] Patient Information 2015 [place]. This information is not intended to replace advice given to you by your health care provider. Make sure you discuss any questions you have with your health care provider.

## 2014-11-02 ENCOUNTER — Encounter (HOSPITAL_COMMUNITY): Payer: Self-pay | Admitting: Emergency Medicine

## 2014-11-02 ENCOUNTER — Emergency Department (HOSPITAL_COMMUNITY)
Admission: EM | Admit: 2014-11-02 | Discharge: 2014-11-02 | Discharge status: Against Medical Advice | Payer: Medicaid Other | Attending: Emergency Medicine | Admitting: Emergency Medicine

## 2014-11-02 DIAGNOSIS — G8929 Other chronic pain: Secondary | ICD-10-CM | POA: Insufficient documentation

## 2014-11-02 DIAGNOSIS — J45909 Unspecified asthma, uncomplicated: Secondary | ICD-10-CM | POA: Insufficient documentation

## 2014-11-02 DIAGNOSIS — R05 Cough: Secondary | ICD-10-CM | POA: Insufficient documentation

## 2014-11-02 DIAGNOSIS — R112 Nausea with vomiting, unspecified: Secondary | ICD-10-CM | POA: Diagnosis not present

## 2014-11-02 DIAGNOSIS — Z720 Tobacco use: Secondary | ICD-10-CM | POA: Diagnosis not present

## 2014-11-02 NOTE — ED Notes (Signed)
Pt stated she has a sick child at home and needs to leave.  Signed ama

## 2014-11-02 NOTE — ED Notes (Signed)
Pt. Reports cough and nausea/vomiting x2 days. Denies diarrhea. Pt. Reports fever that has been controlled with Motrin. Pt. Reports being out of inhaler.

## 2014-11-22 IMAGING — CR DG CHEST 2V
2 series · 2 of 2 positions shown · non-contrast
Comparison: 08/02/2012

CLINICAL DATA: Persistent, sometimes productive cough for 3 days.

CHEST - 2 VIEW

[w chest pa]
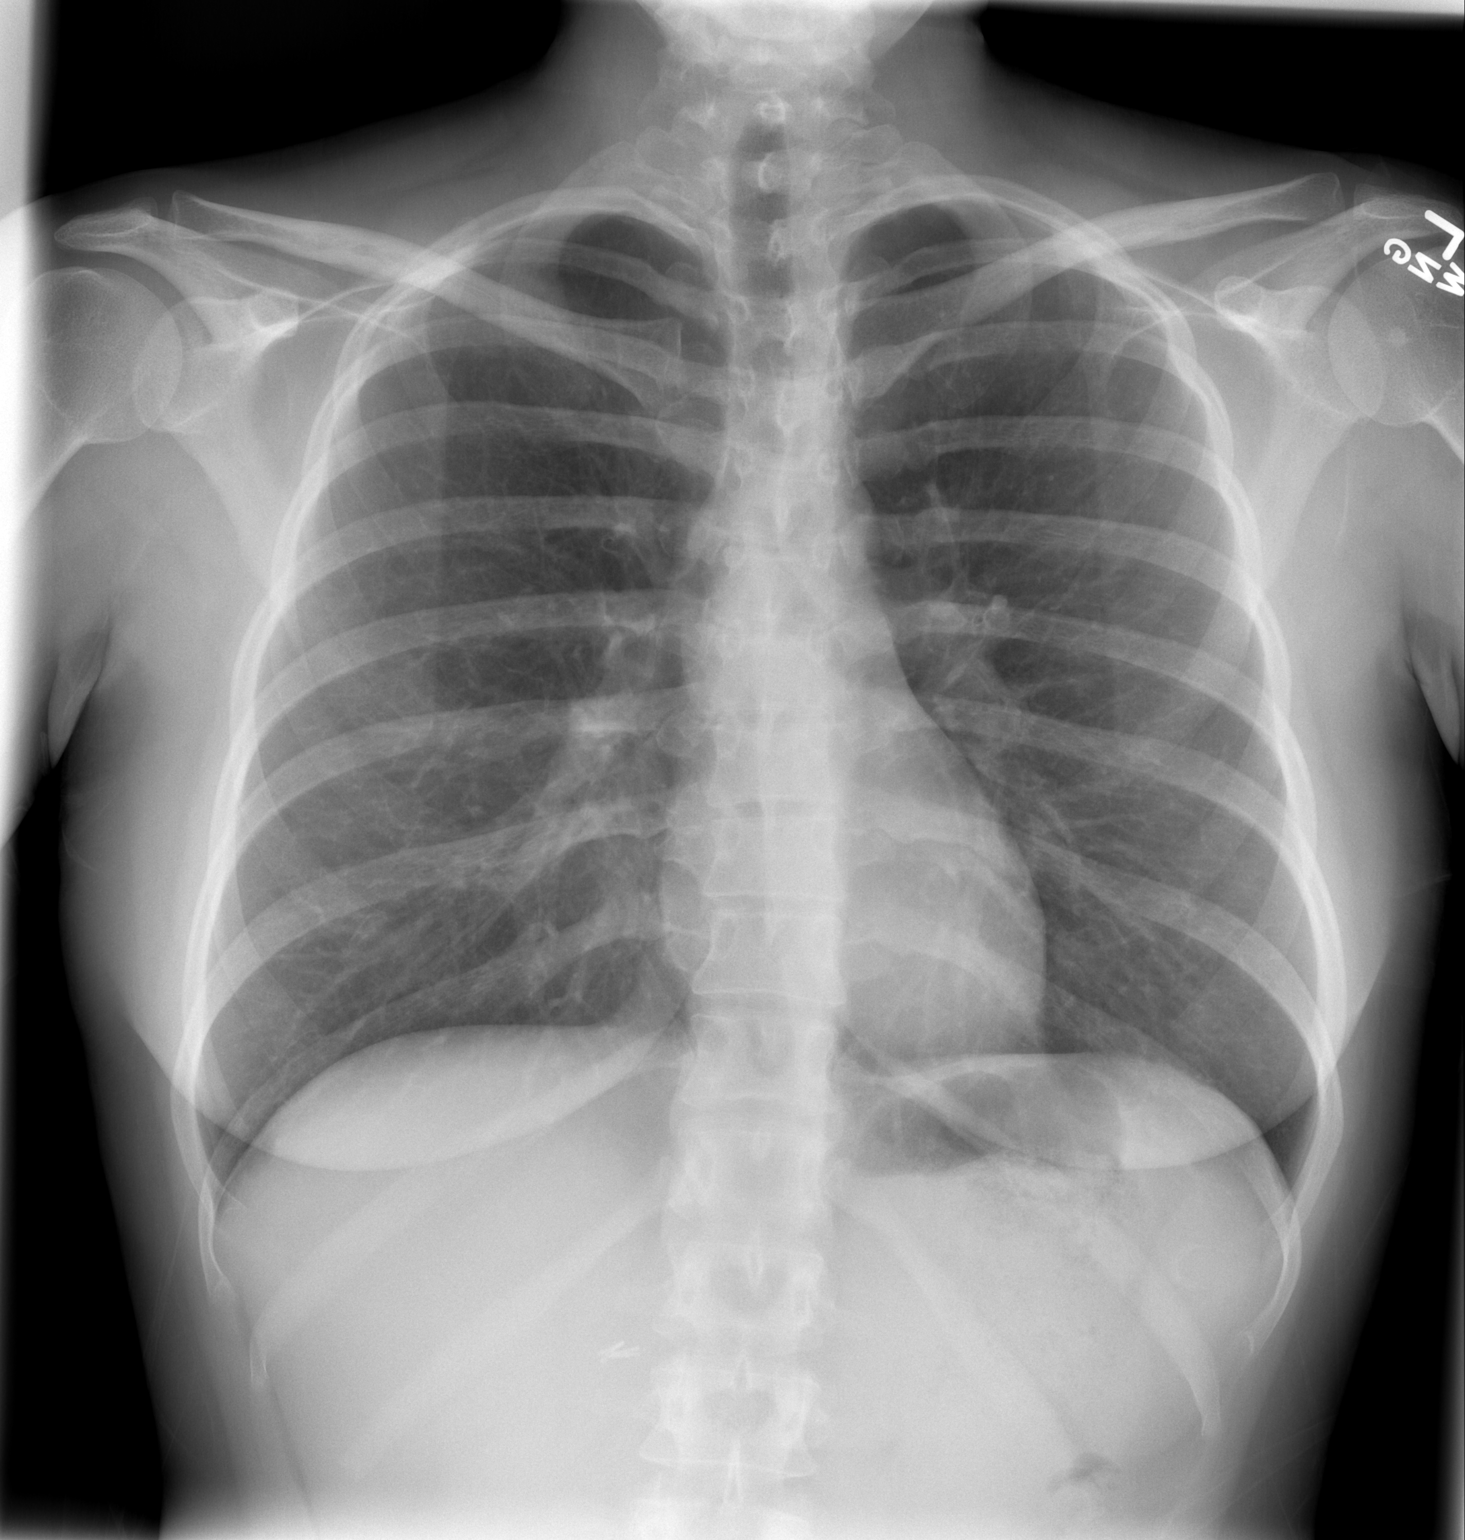

[w chest lat]
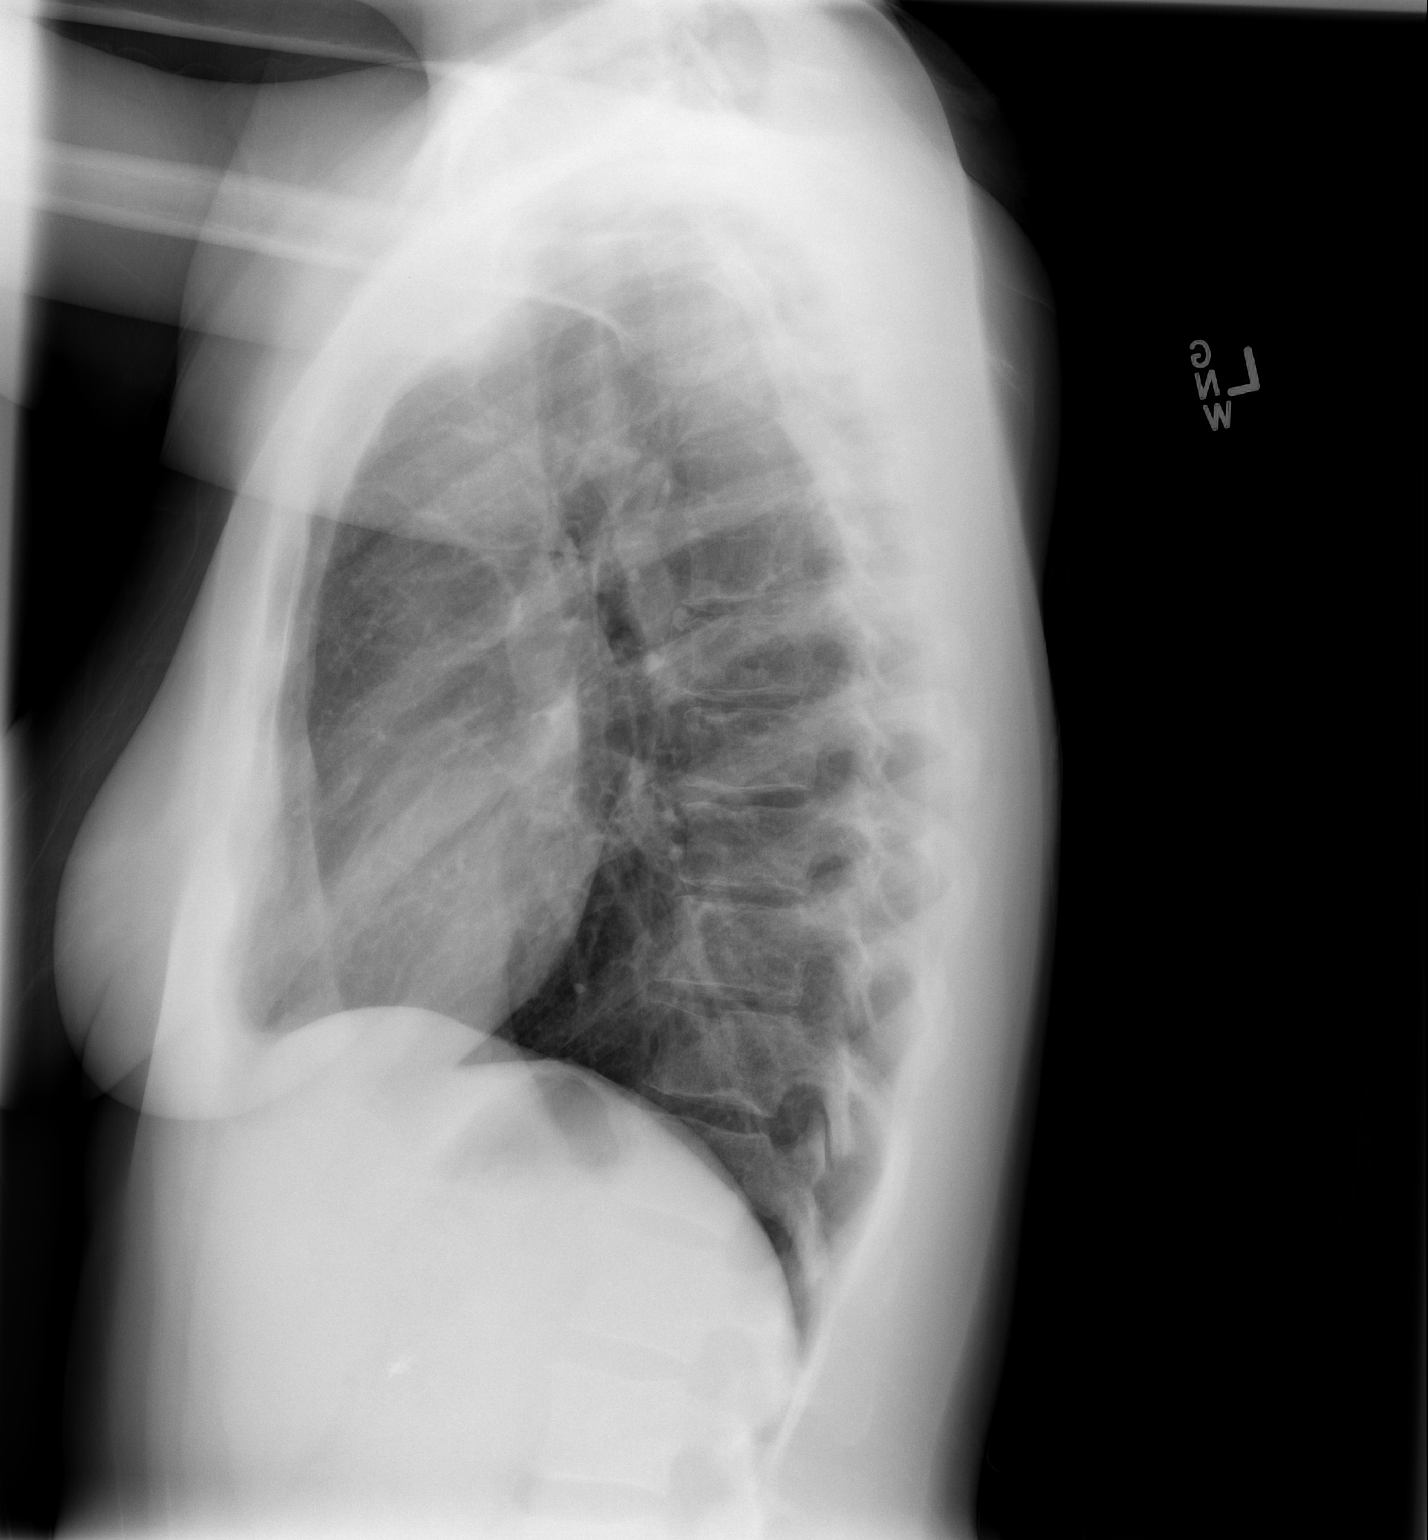

[2 of 2 positions shown; findings below may reference images not displayed]

FINDINGS: The heart size and pulmonary vascularity are normal. The
lungs appear clear and expanded without focal air space disease or
consolidation. No blunting of the costophrenic angles.  No
pneumothorax.  Mediastinal contours appear intact.  Surgical clips
in the right upper quadrant.  No acute changes since the previous
study.
IMPRESSION: No evidence of active pulmonary disease

## 2015-01-08 ENCOUNTER — Encounter (HOSPITAL_COMMUNITY): Payer: Medicaid Other

## 2015-01-08 ENCOUNTER — Inpatient Hospital Stay (HOSPITAL_COMMUNITY)
Admission: EM | Admit: 2015-01-08 | Discharge: 2015-01-10 | DRG: 603 | Disposition: A | Discharge status: Home / Self Care | Payer: Medicaid Other | Attending: Internal Medicine | Admitting: Internal Medicine

## 2015-01-08 ENCOUNTER — Encounter (HOSPITAL_COMMUNITY): Payer: Self-pay | Admitting: *Deleted

## 2015-01-08 DIAGNOSIS — D649 Anemia, unspecified: Secondary | ICD-10-CM | POA: Diagnosis present

## 2015-01-08 DIAGNOSIS — L0390 Cellulitis, unspecified: Secondary | ICD-10-CM | POA: Diagnosis present

## 2015-01-08 DIAGNOSIS — E871 Hypo-osmolality and hyponatremia: Secondary | ICD-10-CM | POA: Diagnosis present

## 2015-01-08 DIAGNOSIS — F1990 Other psychoactive substance use, unspecified, uncomplicated: Secondary | ICD-10-CM

## 2015-01-08 DIAGNOSIS — F1410 Cocaine abuse, uncomplicated: Secondary | ICD-10-CM | POA: Diagnosis present

## 2015-01-08 DIAGNOSIS — F17210 Nicotine dependence, cigarettes, uncomplicated: Secondary | ICD-10-CM | POA: Diagnosis present

## 2015-01-08 DIAGNOSIS — R569 Unspecified convulsions: Secondary | ICD-10-CM | POA: Insufficient documentation

## 2015-01-08 DIAGNOSIS — F319 Bipolar disorder, unspecified: Secondary | ICD-10-CM | POA: Diagnosis present

## 2015-01-08 DIAGNOSIS — G40909 Epilepsy, unspecified, not intractable, without status epilepticus: Secondary | ICD-10-CM | POA: Diagnosis not present

## 2015-01-08 DIAGNOSIS — E876 Hypokalemia: Secondary | ICD-10-CM | POA: Diagnosis not present

## 2015-01-08 DIAGNOSIS — L03113 Cellulitis of right upper limb: Principal | ICD-10-CM

## 2015-01-08 DIAGNOSIS — Z825 Family history of asthma and other chronic lower respiratory diseases: Secondary | ICD-10-CM | POA: Diagnosis not present

## 2015-01-08 DIAGNOSIS — Z833 Family history of diabetes mellitus: Secondary | ICD-10-CM

## 2015-01-08 DIAGNOSIS — L03114 Cellulitis of left upper limb: Secondary | ICD-10-CM

## 2015-01-08 DIAGNOSIS — W010XXA Fall on same level from slipping, tripping and stumbling without subsequent striking against object, initial encounter: Secondary | ICD-10-CM | POA: Diagnosis present

## 2015-01-08 HISTORY — DX: Anemia, unspecified: D64.9

## 2015-01-08 LAB — BASIC METABOLIC PANEL
ANION GAP: 10 (ref 5–15)
BUN: 5 mg/dL — AB (ref 6–20)
CALCIUM: 8.6 mg/dL — AB (ref 8.9–10.3)
CO2: 24 mmol/L (ref 22–32)
Chloride: 105 mmol/L (ref 101–111)
Creatinine, Ser: 0.71 mg/dL (ref 0.44–1.00)
GFR calc Af Amer: >60 mL/min (ref >60)
GFR calc non Af Amer: >60 mL/min (ref >60)
Glucose, Bld: 99 mg/dL (ref 65–99)
Potassium: 2.9 mmol/L — ABNORMAL LOW (ref 3.5–5.1)
Sodium: 139 mmol/L (ref 135–145)

## 2015-01-08 LAB — FERRITIN: FERRITIN: 44 ng/mL (ref 11–307)

## 2015-01-08 LAB — CBC WITH DIFFERENTIAL/PLATELET
Basophils Absolute: 0 10*3/uL (ref 0.0–0.1)
Basophils Relative: 0 % (ref 0–1)
EOS PCT: 3 % (ref 0–5)
Eosinophils Absolute: 0.2 10*3/uL (ref 0.0–0.7)
HCT: 32.6 % — ABNORMAL LOW (ref 36.0–46.0)
Hemoglobin: 10.7 g/dL — ABNORMAL LOW (ref 12.0–15.0)
LYMPHS ABS: 1.9 10*3/uL (ref 0.7–4.0)
Lymphocytes Relative: 28 % (ref 12–46)
MCH: 30.1 pg (ref 26.0–34.0)
MCHC: 32.8 g/dL (ref 30.0–36.0)
MCV: 91.6 fL (ref 78.0–100.0)
MONOS PCT: 6 % (ref 3–12)
Monocytes Absolute: 0.4 10*3/uL (ref 0.1–1.0)
Neutro Abs: 4.2 10*3/uL (ref 1.7–7.7)
Neutrophils Relative %: 63 % (ref 43–77)
PLATELETS: 264 10*3/uL (ref 150–400)
RBC: 3.56 MIL/uL — ABNORMAL LOW (ref 3.87–5.11)
RDW: 14.6 % (ref 11.5–15.5)
WBC: 6.6 10*3/uL (ref 4.0–10.5)

## 2015-01-08 LAB — IRON AND TIBC
IRON: 13 ug/dL — AB (ref 28–170)
SATURATION RATIOS: 6 % — AB (ref 10.4–31.8)
TIBC: 225 ug/dL — ABNORMAL LOW (ref 250–450)
UIBC: 212 ug/dL

## 2015-01-08 LAB — VITAMIN B12: Vitamin B-12: 181 pg/mL (ref 180–914)

## 2015-01-08 LAB — RETICULOCYTES
RBC.: 3.6 MIL/uL — AB (ref 3.87–5.11)
Retic Count, Absolute: 79.2 10*3/uL (ref 19.0–186.0)
Retic Ct Pct: 2.2 % (ref 0.4–3.1)

## 2015-01-08 LAB — MAGNESIUM: Magnesium: 2.1 mg/dL (ref 1.7–2.4)

## 2015-01-08 LAB — FOLATE: Folate: 7.3 ng/mL (ref >5.9)

## 2015-01-08 MED ORDER — ALUM & MAG HYDROXIDE-SIMETH 200-200-20 MG/5ML PO SUSP: 30.0000 mL | Freq: Four times a day (QID) | ORAL | Status: DC | PRN

## 2015-01-08 MED ORDER — ALBUTEROL SULFATE (2.5 MG/3ML) 0.083% IN NEBU: 2.5000 mg | INHALATION_SOLUTION | Freq: Four times a day (QID) | RESPIRATORY_TRACT | Status: DC | PRN

## 2015-01-08 MED ORDER — POLYETHYLENE GLYCOL 3350 17 G PO PACK: 17.0000 g | PACK | Freq: Every day | ORAL | Status: DC | PRN

## 2015-01-08 MED ORDER — CARBAMAZEPINE 100 MG PO CHEW
150.0000 mg | CHEWABLE_TABLET | Freq: Three times a day (TID) | ORAL | Status: DC
  Administered 2015-01-08 – 2015-01-10 (×7): 150 mg via ORAL
  Filled 2015-01-08 (×11): qty 1.5

## 2015-01-08 MED ORDER — ONDANSETRON HCL 4 MG/2ML IJ SOLN
4.0000 mg | Freq: Once | INTRAMUSCULAR | Status: AC
  Administered 2015-01-08: 4 mg via INTRAVENOUS
  Filled 2015-01-08: qty 2

## 2015-01-08 MED ORDER — TRAZODONE HCL 50 MG PO TABS: 25.0000 mg | ORAL_TABLET | Freq: Every evening | ORAL | Status: DC | PRN

## 2015-01-08 MED ORDER — GABAPENTIN 300 MG PO CAPS
300.0000 mg | ORAL_CAPSULE | Freq: Three times a day (TID) | ORAL | Status: DC
  Administered 2015-01-08 – 2015-01-10 (×7): 300 mg via ORAL
  Filled 2015-01-08 (×7): qty 1

## 2015-01-08 MED ORDER — ENOXAPARIN SODIUM 40 MG/0.4ML ~~LOC~~ SOLN
40.0000 mg | SUBCUTANEOUS | Status: DC
  Administered 2015-01-08 – 2015-01-09 (×2): 40 mg via SUBCUTANEOUS
  Filled 2015-01-08 (×2): qty 0.4

## 2015-01-08 MED ORDER — HYDROCODONE-ACETAMINOPHEN 5-325 MG PO TABS
1.0000 | ORAL_TABLET | ORAL | Status: DC | PRN
  Administered 2015-01-08 – 2015-01-09 (×5): 1 via ORAL
  Administered 2015-01-09: 2 via ORAL
  Administered 2015-01-09 – 2015-01-10 (×3): 1 via ORAL
  Filled 2015-01-08 (×10): qty 1

## 2015-01-08 MED ORDER — LEVOFLOXACIN IN D5W 500 MG/100ML IV SOLN: 500.0000 mg | Freq: Once | INTRAVENOUS | Status: DC

## 2015-01-08 MED ORDER — SODIUM CHLORIDE 0.9 % IJ SOLN
10.0000 mL | INTRAMUSCULAR | Status: DC | PRN
  Administered 2015-01-08: 20 mL
  Filled 2015-01-08: qty 40

## 2015-01-08 MED ORDER — KETOROLAC TROMETHAMINE 30 MG/ML IJ SOLN
30.0000 mg | Freq: Once | INTRAMUSCULAR | Status: AC
  Administered 2015-01-08: 30 mg via INTRAVENOUS
  Filled 2015-01-08: qty 1

## 2015-01-08 MED ORDER — VANCOMYCIN HCL IN DEXTROSE 1-5 GM/200ML-% IV SOLN
1000.0000 mg | Freq: Two times a day (BID) | INTRAVENOUS | Status: DC
  Administered 2015-01-08 – 2015-01-09 (×4): 1000 mg via INTRAVENOUS
  Filled 2015-01-08 (×8): qty 200

## 2015-01-08 MED ORDER — HYDROMORPHONE HCL 1 MG/ML IJ SOLN: 0.5000 mg | INTRAMUSCULAR | Status: DC | PRN

## 2015-01-08 MED ORDER — POTASSIUM CHLORIDE CRYS ER 20 MEQ PO TBCR
40.0000 meq | EXTENDED_RELEASE_TABLET | Freq: Once | ORAL | Status: AC
  Administered 2015-01-08: 40 meq via ORAL
  Filled 2015-01-08: qty 2

## 2015-01-08 MED ORDER — SODIUM CHLORIDE 0.9 % IV SOLN
INTRAVENOUS | Status: AC
Start: 2015-01-08 — End: 2015-01-08
  Administered 2015-01-08: 10:00:00 via INTRAVENOUS

## 2015-01-08 MED ORDER — ONDANSETRON HCL 4 MG PO TABS: 4.0000 mg | ORAL_TABLET | Freq: Four times a day (QID) | ORAL | Status: DC | PRN

## 2015-01-08 MED ORDER — SORBITOL 70 % SOLN: 30.0000 mL | Freq: Every day | Status: DC | PRN

## 2015-01-08 MED ORDER — LEVOFLOXACIN IN D5W 750 MG/150ML IV SOLN
750.0000 mg | INTRAVENOUS | Status: DC
  Administered 2015-01-08: 750 mg via INTRAVENOUS
  Filled 2015-01-08 (×2): qty 150

## 2015-01-08 MED ORDER — VANCOMYCIN HCL IN DEXTROSE 1-5 GM/200ML-% IV SOLN: 1000.0000 mg | Freq: Once | INTRAVENOUS | Status: DC

## 2015-01-08 MED ORDER — SODIUM CHLORIDE 0.9 % IJ SOLN
10.0000 mL | Freq: Two times a day (BID) | INTRAMUSCULAR | Status: DC
  Administered 2015-01-08 – 2015-01-09 (×3): 10 mL

## 2015-01-08 MED ORDER — BUPIVACAINE HCL (PF) 0.5 % IJ SOLN
10.0000 mL | Freq: Once | INTRAMUSCULAR | Status: DC
  Filled 2015-01-08: qty 30

## 2015-01-08 MED ORDER — ONDANSETRON HCL 4 MG/2ML IJ SOLN: 4.0000 mg | Freq: Four times a day (QID) | INTRAMUSCULAR | Status: DC | PRN

## 2015-01-08 NOTE — ED Notes (Signed)
MD at bedside. 

## 2015-01-08 NOTE — Clinical Social Work Note (Signed)
Clinical Social Work Assessment  Patient Details  Name: Veronica ChuteSamantha L Groene MRN: [id number] Date of Birth: 07-22-1980  Date of referral:  01/08/15               Reason for consult:  Substance Use/ETOH Abuse                Permission sought to share information with:    Permission granted to share information::     Name::        Agency::     Relationship::     Contact Information:     Housing/Transportation Living arrangements for the past 2 months:  Single Family Home Source of Information:  Patient Patient Interpreter Needed:  None Criminal Activity/Legal Involvement Pertinent to Current Situation/Hospitalization:  No - Comment as needed Significant Relationships:  Parents, Dependent Children Lives with:  Parents Do you feel safe going back to the place where you live?  Yes Need for family participation in patient care:  No (Coment)  Care giving concerns:  Patient does not require care giving assistance.  Social Worker assessment / plan:  Patient reports that she resides in the home with her mother.  She stated that she does not require assistance with ADLs and ambulates unassisted.  She reported that she is currently on probation and that she [facility].  Patient stated that she has plans to go schedule her appointments [facility] so that she can go to counseling and substance abuse treatment there.  Patient reported that she has been "clean" for almost eight years and recently relapsed and used drugs twice. Patient verbalized being disappointed related to her relapse. CSW provided supportive counseling and encouragement.  Patient advised that CSW provided patient with information on cocaine use as well as substance abuse providers.  CSW offered to assist patient in scheduling appointments, patient declined. CSW signing off.     Employment status:  Unemployed Health and safety inspectornsurance information:  [place] Recommendations:  Not assessed at this  time Information / Referral to community resources:  Outpatient Substance Abuse Treatment Options  Patient/Family's Response to care:  Patient realizes the need for treatment and is agreeable to go to treatment. She reports that she wants to schedule her appointments herself.   Patient/Family's Understanding of and Emotional Response to Diagnosis, Current Treatment, and Prognosis:  Patient understands her addiction and realizes the need for treatment.   Emotional Assessment Appearance:  Appears older than stated age Attitude/Demeanor/Rapport:   (Cooperative) Affect (typically observed):  Calm Orientation:  Oriented to Self, Oriented to Place, Oriented to  Time, Oriented to Situation Alcohol / Substance use:  Illicit Drugs Psych involvement (Current and /or in the community):  No (Comment)  Discharge Needs  Concerns to be addressed:  Substance Abuse Concerns Readmission within the last 30 days:  No Current discharge risk:  Substance Abuse Barriers to Discharge:  No Barriers Identified   Annice NeedySettle, Rilei Kravitz D, LCSW 01/08/2015, 12:31 PM [phone]

## 2015-01-08 NOTE — ED Provider Notes (Signed)
CSN: [id number]     Arrival date & time 01/08/15  0209 History   First MD Initiated Contact with Patient 01/08/15 0303     Chief Complaint  Patient presents with  . Arm Swelling     (Consider location/radiation/quality/duration/timing/severity/associated sxs/prior Treatment) HPI   Patient reports that she either got stuck with a needle or a hammer and nail and then she said there was a board with a nail on it leaning against her house and she tripped and fell and the nail scraped her right forearm. She states that happened 2 days ago. She states it started getting red yesterday and started swelling tonight. She states she's been having fever off and on and states she had a fever of 102.1 yesterday. She reports chills. She denies back pain, chest pain, or shortness of breath.  Patient admits to IV drug use. She states she shoots up cocaine. She states the last time was 3 days ago. She states she had stopped for several years however she got back with some friends regarding her using again. Patient reports she had cellulitis on her arm once before and became septic and had to be admitted.    PCP [facility] Department   Past Medical History  Diagnosis Date  . Seizures   . Bipolar 1 disorder   . Asthma   . Substance abuse   . Chronic back pain   . Sciatica   . Diverticulitis   . Renal disorder     kidney stones   Past Surgical History  Procedure Laterality Date  . Cholecystectomy    . Abdominal hysterectomy    . Abdominal surgery    . Dilation and curettage of uterus    . Colonoscopy N/A 11/24/2012    Procedure: COLONOSCOPY;  Surgeon: Malissa Hippo, MD;  Location[facility];  Service: Endoscopy;  Laterality: N/A;  . Mandible surgery     Family History  Problem Relation Age of Onset  . Cancer Mother     bone  . Asthma Sister   . Asthma Daughter   . Asthma Son   . Diabetes Maternal Grandmother   . Cancer Maternal Grandfather   . Asthma Son   . Asthma  Son    History  Substance Use Topics  . Smoking status: Current Every Day Smoker -- 1.00 packs/day    Types: Cigarettes  . Smokeless tobacco: Never Used  . Alcohol Use: No     Comment: occ   Applying for disability b/o seizures  OB History    No data available     Review of Systems  All other systems reviewed and are negative.     Allergies  Black pepper; Coconut flavor; Metronidazole; Penicillins; Rocephin; Propoxyphene n-acetaminophen; Ultram; and Motrin  Home Medications   Prior to Admission medications   Medication Sig Start Date End Date Taking? Authorizing Provider  albuterol (PROVENTIL HFA;VENTOLIN HFA) 108 (90 BASE) MCG/ACT inhaler Inhale 2 puffs into the lungs every 6 (six) hours as needed. Shortness of breath    Historical Provider, MD  carbamazepine (TEGRETOL) 100 MG chewable tablet Chew 150 mg by mouth 3 (three) times daily.     Historical Provider, MD  cyclobenzaprine (FLEXERIL) 10 MG tablet Take 1 tablet (10 mg total) by mouth 3 (three) times daily as needed. 07/17/14   Tammy Triplett, PA-C  gabapentin (NEURONTIN) 300 MG capsule Take 300 mg by mouth 3 (three) times daily.      Historical Provider, MD  HYDROcodone-acetaminophen (NORCO/VICODIN)  5-325 MG per tablet Take one-two tabs po q 4-6 hrs prn pain 07/17/14   Tammy Triplett, PA-C  naproxen (NAPROSYN) 500 MG tablet Take 1 tablet (500 mg total) by mouth 2 (two) times daily. 08/27/13   Devoria AlbeIva Laniesha Das, MD  norgestimate-ethinyl estradiol (ORTHO-CYCLEN,SPRINTEC,PREVIFEM) 0.25-35 MG-MCG tablet Take 1 tablet by mouth daily. 08/29/13   Nelva Nayobert Beaton, MD  Norgestimate-Ethinyl Estradiol Triphasic 0.18/0.215/0.25 MG-25 MCG tab 1 hormonally active tab x21 d followed by inert tab x7 d; repeat cycle 08/29/13   Nelva Nayobert Beaton, MD  sulfamethoxazole-trimethoprim (SEPTRA DS) 800-160 MG per tablet Take 1 tablet by mouth every 12 (twelve) hours. 04/25/14   Elson AreasLeslie K Sofia, PA-C   BP 119/68 mmHg  Pulse 56  Temp(Src) 98.1 F (36.7 C) (Oral)   Resp 22  Ht 5\' 7"  (1.702 m)  Wt 135 lb (61.236 kg)  BMI 21.14 kg/m2  SpO2 100%  Vital signs normal except for bradycardia  Physical Exam  Constitutional: She is oriented to person, place, and time.  Non-toxic appearance. She does not appear ill. No distress.  Thin underweight, frequent yawning  HENT:  Head: Normocephalic and atraumatic.  Right Ear: External ear normal.  Left Ear: External ear normal.  Nose: Nose normal. No mucosal edema or rhinorrhea.  Mouth/Throat: Oropharynx is clear and moist and mucous membranes are normal. No dental abscesses or uvula swelling.  Eyes: Conjunctivae and EOM are normal. Pupils are equal, round, and reactive to light.  Neck: Normal range of motion and full passive range of motion without pain. Neck supple.  Cardiovascular: Normal rate, regular rhythm and normal heart sounds.  Exam reveals no gallop and no friction rub.   No murmur heard. Pulmonary/Chest: Effort normal and breath sounds normal. No respiratory distress. She has no wheezes. She has no rhonchi. She has no rales. She exhibits no tenderness and no crepitus.  Abdominal: Soft. Normal appearance and bowel sounds are normal. She exhibits no distension. There is no tenderness. There is no rebound and no guarding.  Musculoskeletal: Normal range of motion. She exhibits no edema or tenderness.  Moves all extremities well.   Neurological: She is alert and oriented to person, place, and time. She has normal strength. No cranial nerve deficit.  Skin: Skin is warm, dry and intact. No rash noted. No erythema. There is pallor.  Patient has an area of induration on her medial proximal forearm surrounded by redness.  Patient has track marks mainly around her left wrist.  Psychiatric: She has a normal mood and affect. Her speech is normal and behavior is normal. Her mood appears not anxious.  Nursing note and vitals reviewed.       ED Course  Procedures (including critical care time)  Medications    bupivacaine (MARCAINE) 0.5 % injection 10 mL (not administered)  vancomycin (VANCOCIN) IVPB 1000 mg/200 mL premix (not administered)  levofloxacin (LEVAQUIN) IVPB 500 mg (not administered)  ketorolac (TORADOL) 30 MG/ML injection 30 mg (30 mg Intravenous Given 01/08/15 0511)  ondansetron (ZOFRAN) injection 4 mg (4 mg Intravenous Given 01/08/15 0511)    07:47 Dr Irene LimboGoodrich, admit to obs, med-surg, wants IV team to place PICC line.   EMERGENCY DEPARTMENT US SOFT TISSUE INTERPRETATION "Study: Limited Ultrasound of the noted body part in comments below"  INDICATIONS: Pain and Soft tissue infection Multiple views of the body part are obtained with a multi-frequency linear probe  PERFORMED BY:  Myself  IMAGES ARCHIVED?: Yes  SIDE:Right   BODY PART:Upper extremity  FINDINGS: ? Very small abscess present.  LIMITATIONS:  none  INTERPRETATION:  Small abscess present  Patient was numbed with Marcaine 0.5% an 18-gauge needle was inserted in the area or a thought I saw there was a small abscess without return of pus.   Labs Review Results for orders placed or performed during the hospital encounter of 01/08/15  Culture, blood (routine x 2)  Result Value Ref Range   Specimen Description BLOOD LEFT ARM    Special Requests BOTTLES DRAWN AEROBIC ONLY 7CC DRAWN BY RN    Culture PENDING    Report Status PENDING   Culture, blood (routine x 2)  Result Value Ref Range   Specimen Description BLOOD LEFT HAND    Special Requests BOTTLES DRAWN AEROBIC ONLY 7CC DRAWN BY RN    Culture PENDING    Report Status PENDING   Basic metabolic panel  Result Value Ref Range   Sodium 139 135 - 145 mmol/L   Potassium 2.9 (L) 3.5 - 5.1 mmol/L   Chloride 105 101 - 111 mmol/L   CO2 24 22 - 32 mmol/L   Glucose, Bld 99 65 - 99 mg/dL   BUN 5 (L) 6 - 20 mg/dL   Creatinine, Ser 9.14 0.44 - 1.00 mg/dL   Calcium 8.6 (L) 8.9 - 10.3 mg/dL   GFR calc non Af Amer >60 >60 mL/min   GFR calc Af Amer >60 >60 mL/min    Anion gap 10 5 - 15  CBC with Differential  Result Value Ref Range   WBC 6.6 4.0 - 10.5 K/uL   RBC 3.56 (L) 3.87 - 5.11 MIL/uL   Hemoglobin 10.7 (L) 12.0 - 15.0 g/dL   HCT 78.2 (L) 95.6 - 21.3 %   MCV 91.6 78.0 - 100.0 fL   MCH 30.1 26.0 - 34.0 pg   MCHC 32.8 30.0 - 36.0 g/dL   RDW 08.6 57.8 - 46.9 %   Platelets 264 150 - 400 K/uL   Neutrophils Relative % 63 43 - 77 %   Neutro Abs 4.2 1.7 - 7.7 K/uL   Lymphocytes Relative 28 12 - 46 %   Lymphs Abs 1.9 0.7 - 4.0 K/uL   Monocytes Relative 6 3 - 12 %   Monocytes Absolute 0.4 0.1 - 1.0 K/uL   Eosinophils Relative 3 0 - 5 %   Eosinophils Absolute 0.2 0.0 - 0.7 K/uL   Basophils Relative 0 0 - 1 %   Basophils Absolute 0.0 0.0 - 0.1 K/uL   Laboratory interpretation all normal except mild anemia     Imaging Review No results found.   EKG Interpretation None      MDM   Final diagnoses:  Cellulitis of forearm, left  IV drug user    Plan admission   Devoria Albe, MD, Concha Pyo, MD 01/08/15 [phone]

## 2015-01-08 NOTE — Progress Notes (Signed)
Peripherally Inserted Central Catheter/Midline Placement  The IV Nurse has discussed with the patient and/or persons authorized to consent for the patient, the purpose of this procedure and the potential benefits and risks involved with this procedure.  The benefits include less needle sticks, lab draws from the catheter and patient may be discharged home with the catheter.  Risks include, but not limited to, infection, bleeding, blood clot (thrombus formation), and puncture of an artery; nerve damage and irregular heat beat.  Alternatives to this procedure were also discussed.  PICC/Midline Placement Documentation  PICC / Midline Single Lumen 01/08/15 PICC Left Basilic 39 cm 2 cm (Active)  Indication for Insertion or Continuance of Line Prolonged intravenous therapies;Poor Vasculature-patient has had multiple peripheral attempts or PIVs lasting less than 24 hours 01/08/2015 10:50 AM  Exposed Catheter (cm) 39 cm 01/08/2015 10:50 AM  Site Assessment Clean;Dry;Intact 01/08/2015 10:50 AM  Line Status Flushed;Capped (central line);Blood return noted 01/08/2015 10:50 AM  Dressing Type Transparent;Securing device 01/08/2015 10:50 AM  Dressing Status Clean;Dry;Intact 01/08/2015 10:50 AM  Line Care Connections checked and tightened 01/08/2015 10:50 AM  Line Adjustment (NICU/IV Team Only) Yes 01/08/2015 10:50 AM  Dressing Intervention New dressing 01/08/2015 10:50 AM       Trenton GammonWitherspoon, Isela Stantz S 01/08/2015, 10:54 AM

## 2015-01-08 NOTE — ED Notes (Signed)
Patient states she "scratched" her right forearm a week ago on a nail. Tonight patient is c/o swelling and redness to right forearm. Patient also c/o redness and swelling to left wrist with drainage. Patient also has red swollen area to left forearm from IV drug use.

## 2015-01-08 NOTE — ED Notes (Signed)
Pt. Needs PICC line for IV antibiotics. Has a # 22 gauge in a left pinky at present. Patient waiting for bed assignment and wants breakfast, will take potassium that is orderd after eating.

## 2015-01-08 NOTE — ED Notes (Signed)
Pt c/o pain and redness right elbow

## 2015-01-08 NOTE — H&P (Signed)
[facility] History and Physical  [name] ZOX:[id number] DOB: Oct 30, 1980 DOA: 01/08/2015  Referring physician: knapp PCP: Tilda Burrow, MD   Chief Complaint: right arm swelling/pain  HPI: [name] is a 34 y.o. female with a past medical history that includes seizure disorder, bipolar disorder, substance abuse, chronic pain presents emergency department with the chief complaint of right arm swelling erythema and pain. Initial evaluation reveals a cellulitis of right arm inner aspect near the elbow and left wrist as well as hyponatremia. Patient admits to IV drug abuse but reports not using vein and right arm. She reports that she leaned up against the nail 2 days ago. Shortly thereafter right elbow and inner aspect of her forearm began to swell and turn red. Associated symptoms include pain fever reportedly 100.8 orally nausea with 2 episodes of emesis. She denies abdominal pain dysuria hematuria frequency or urgency. She denies headache visual disturbances syncope or near-syncope. She also reports history of the same which she was hospitalized.  Workup in the emergency department reveals a potassium of 2.9 hemoglobin 10.7 otherwise unremarkable. She is hemodynamically stable afebrile and not hypoxic.   Review of Systems:  10 point review of systems complete and all systems are negative except as indicated in the history of present illness  Past Medical History  Diagnosis Date  . Seizures   . Bipolar 1 disorder   . Asthma   . Substance abuse   . Chronic back pain   . Sciatica   . Diverticulitis   . Renal disorder     kidney stones   Past Surgical History  Procedure Laterality Date  . Cholecystectomy    . Abdominal hysterectomy    . Abdominal surgery    . Dilation and curettage of uterus    . Colonoscopy N/A 11/24/2012    Procedure: COLONOSCOPY;  Surgeon: Malissa Hippo, MD;  Location: [facility];  Service: Endoscopy;  Laterality: N/A;  . Mandible  surgery     Social History:  reports that she has been smoking Cigarettes.  She has been smoking about 1.00 pack per day. She has never used smokeless tobacco. She reports that she does not drink alcohol or use illicit drugs. She lives with her mother she is independent with ADLs she is unemployed  Allergies  Allergen Reactions  . Black Pepper [Piper] Anaphylaxis, Itching and Swelling    Swelling and itching of throat  . Coconut Flavor Anaphylaxis and Hives  . Metronidazole Anaphylaxis and Rash  . Penicillins Anaphylaxis    Throat swell shut - states can take Amoxicillin safely  . Rocephin [Ceftriaxone Sodium] Anaphylaxis    unknown  . Propoxyphene N-Acetaminophen Nausea And Vomiting  . Ultram [Tramadol] Nausea And Vomiting and Rash  . Motrin [Ibuprofen]     rash    Family History  Problem Relation Age of Onset  . Cancer Mother     bone  . Asthma Sister   . Asthma Daughter   . Asthma Son   . Diabetes Maternal Grandmother   . Cancer Maternal Grandfather   . Asthma Son   . Asthma Son     Prior to Admission medications   Medication Sig Start Date End Date Taking? Authorizing Provider  albuterol (PROVENTIL HFA;VENTOLIN HFA) 108 (90 BASE) MCG/ACT inhaler Inhale 2 puffs into the lungs every 6 (six) hours as needed. Shortness of breath    Historical Provider, MD  carbamazepine (TEGRETOL) 100 MG chewable tablet Chew 150 mg by mouth 3 (three) times  daily.     Historical Provider, MD  cyclobenzaprine (FLEXERIL) 10 MG tablet Take 1 tablet (10 mg total) by mouth 3 (three) times daily as needed. 07/17/14   Tammy Triplett, PA-C  gabapentin (NEURONTIN) 300 MG capsule Take 300 mg by mouth 3 (three) times daily.      Historical Provider, MD  HYDROcodone-acetaminophen (NORCO/VICODIN) 5-325 MG per tablet Take one-two tabs po q 4-6 hrs prn pain 07/17/14   Tammy Triplett, PA-C  naproxen (NAPROSYN) 500 MG tablet Take 1 tablet (500 mg total) by mouth 2 (two) times daily. 08/27/13   Devoria Albe, MD    norgestimate-ethinyl estradiol (ORTHO-CYCLEN,SPRINTEC,PREVIFEM) 0.25-35 MG-MCG tablet Take 1 tablet by mouth daily. 08/29/13   Nelva Nay, MD  Norgestimate-Ethinyl Estradiol Triphasic 0.18/0.215/0.25 MG-25 MCG tab 1 hormonally active tab x21 d followed by inert tab x7 d; repeat cycle 08/29/13   Nelva Nay, MD  sulfamethoxazole-trimethoprim (SEPTRA DS) 800-160 MG per tablet Take 1 tablet by mouth every 12 (twelve) hours. 04/25/14   Elson Areas, PA-C   Physical Exam: Filed Vitals:   01/08/15 0239 01/08/15 0513  BP: 130/64 119/68  Pulse: 79 56  Temp: 98.7 F (37.1 C) 98.1 F (36.7 C)  TempSrc: Oral Oral  Resp: 20 22  Height:  (1.702 m)   Weight: 61.236 kg (135 lb)   SpO2: 100% 100%    Wt Readings from Last 3 Encounters:  01/08/15 61.236 kg (135 lb)  11/02/14 58.968 kg (130 lb)  07/17/14 64.864 kg (143 lb)    General:  Appears calm and comfortable Eyes: PERRL, normal lids, irises & conjunctiva ENT: grossly normal hearing, he does membranes of her mouth are pink slightly dry tongue piercing clean and dry very poor dentition  Neck: no LAD, masses or thyromegaly Cardiovascular: RRR, no m/r/g. No LE edema.  Respiratory: CTA bilaterally, no w/r/r. Normal respiratory effort. Abdomen: soft, ntnd positive bowel sounds throughout  Skin: Her aspect of her right elbow with swelling erythema extending to her wrist. Warm to touch. Tender. Full range of motion to the elbow. Left wrist with mild erythema/swelling. No heat. No drainage from either site.  Musculoskeletal: grossly normal tone BUE/BLE Psychiatric: grossly normal mood and affect, speech fluent and appropriate Neurologic: grossly non-focal. Speech clear facial symmetry           Labs on Admission:  Basic Metabolic Panel:  Recent Labs Lab 01/08/15 0343  NA 139  K 2.9*  CL 105  CO2 24  GLUCOSE 99  BUN 5*  CREATININE 0.71  CALCIUM 8.6*   Liver Function Tests: No results for input(s): AST, ALT, ALKPHOS,  BILITOT, PROT, ALBUMIN in the last 168 hours. No results for input(s): LIPASE, AMYLASE in the last 168 hours. No results for input(s): AMMONIA in the last 168 hours. CBC:  Recent Labs Lab 01/08/15 0343  WBC 6.6  NEUTROABS 4.2  HGB 10.7*  HCT 32.6*  MCV 91.6  PLT 264   Cardiac Enzymes: No results for input(s): CKTOTAL, CKMB, CKMBINDEX, TROPONINI in the last 168 hours.  BNP (last 3 results) No results for input(s): BNP in the last 8760 hours.  ProBNP (last 3 results) No results for input(s): PROBNP in the last 8760 hours.  CBG: No results for input(s): GLUCAP in the last 168 hours.  Radiological Exams on Admission: No results found.  EKG:   Assessment/Plan Principal Problem:   Cellulitis: likley related to IV drug abuse. Will admit to medical floor. Will provide thank and Levaquin. Blood cultures. Supportive therapy. She  is afebrile and nontoxic appearing. Picc line as IV access very limited Active Problems: Hypokalemia; likely related to decreased oral intake. Will replete and recheck. Will obtain magnesium level as well. Check in the morning    IV drug user: Reports taking IV cocaine 3 days ago. Will request social work consult. Obtain HIV.    Seizure disorder: Reports history of epilepsy. Reports last seizure 3 days ago. Continue home medications.      Bipolar 1 disorder: Appears stable at baselin    Code Status: full DVT Prophylaxis: Family Communication: none present Disposition Plan: home when ready Time spent: 60 minutes  Lake[facility] Ranch Medical CenterBLACK,KAREN M [facility] Pager [phone]

## 2015-01-09 ENCOUNTER — Encounter (HOSPITAL_COMMUNITY): Payer: Self-pay | Admitting: Internal Medicine

## 2015-01-09 LAB — COMPREHENSIVE METABOLIC PANEL
ALK PHOS: 88 U/L (ref 38–126)
ALT: 32 U/L (ref 14–54)
ANION GAP: 5 (ref 5–15)
AST: 49 U/L — AB (ref 15–41)
Albumin: 2.8 g/dL — ABNORMAL LOW (ref 3.5–5.0)
BUN: 9 mg/dL (ref 6–20)
CALCIUM: 8 mg/dL — AB (ref 8.9–10.3)
CHLORIDE: 108 mmol/L (ref 101–111)
CO2: 24 mmol/L (ref 22–32)
CREATININE: 0.56 mg/dL (ref 0.44–1.00)
GFR calc Af Amer: >60 mL/min (ref >60)
Glucose, Bld: 117 mg/dL — ABNORMAL HIGH (ref 65–99)
Potassium: 4.1 mmol/L (ref 3.5–5.1)
SODIUM: 137 mmol/L (ref 135–145)
Total Bilirubin: 0.4 mg/dL (ref 0.3–1.2)
Total Protein: 5.6 g/dL — ABNORMAL LOW (ref 6.5–8.1)

## 2015-01-09 LAB — CBC
HEMATOCRIT: 30 % — AB (ref 36.0–46.0)
HEMOGLOBIN: 9.6 g/dL — AB (ref 12.0–15.0)
MCH: 29.7 pg (ref 26.0–34.0)
MCHC: 32 g/dL (ref 30.0–36.0)
MCV: 92.9 fL (ref 78.0–100.0)
PLATELETS: 219 10*3/uL (ref 150–400)
RBC: 3.23 MIL/uL — ABNORMAL LOW (ref 3.87–5.11)
RDW: 14.8 % (ref 11.5–15.5)
WBC: 5.6 10*3/uL (ref 4.0–10.5)

## 2015-01-09 LAB — HIV ANTIBODY (ROUTINE TESTING W REFLEX): HIV Screen 4th Generation wRfx: NONREACTIVE

## 2015-01-09 MED ORDER — SODIUM CHLORIDE 0.9 % IV SOLN
INTRAVENOUS | Status: DC
  Administered 2015-01-09: 16:00:00 via INTRAVENOUS

## 2015-01-09 MED ORDER — STERILE WATER FOR INJECTION IJ SOLN
INTRAMUSCULAR | Status: AC
Start: 2015-01-09 — End: 2015-01-09
  Filled 2015-01-09: qty 10

## 2015-01-09 MED ORDER — ALTEPLASE 2 MG IJ SOLR
INTRAMUSCULAR | Status: AC
  Filled 2015-01-09: qty 2

## 2015-01-09 MED ORDER — TETANUS-DIPHTH-ACELL PERTUSSIS 5-2.5-18.5 LF-MCG/0.5 IM SUSP
0.5000 mL | Freq: Once | INTRAMUSCULAR | Status: AC
  Administered 2015-01-09: 0.5 mL via INTRAMUSCULAR
  Filled 2015-01-09: qty 0.5

## 2015-01-09 MED ORDER — DIPHENHYDRAMINE HCL 25 MG PO CAPS
25.0000 mg | ORAL_CAPSULE | Freq: Four times a day (QID) | ORAL | Status: DC | PRN
  Administered 2015-01-09 (×2): 25 mg via ORAL
  Filled 2015-01-09 (×2): qty 1

## 2015-01-09 MED ORDER — LORAZEPAM 0.5 MG PO TABS: 0.5000 mg | ORAL_TABLET | Freq: Once | ORAL | Status: DC

## 2015-01-09 MED ORDER — NICOTINE 14 MG/24HR TD PT24
14.0000 mg | MEDICATED_PATCH | Freq: Every day | TRANSDERMAL | Status: DC
  Administered 2015-01-09: 14 mg via TRANSDERMAL
  Filled 2015-01-09: qty 1

## 2015-01-09 NOTE — Progress Notes (Signed)
[facility] PROGRESS NOTE  Shana ChuteSamantha L Leather NGE:[id number] DOB: 07-07-1980 DOA: 01/08/2015 PCP: Tilda BurrowFERGUSON,JOHN V, MD  Assessment/Plan: Cellulitis: likley related to IV drug abuse. Improved this am with less erythema. She remains afebrile and non-toxic appearing.  Blood cultures with no growth to date. Vancomycin and Levaqin day #2.  Active Problems: Hypokalemia; likely related to decreased oral intake. Resolved this am. Magnesium level 2.1,   IV drug user: Reports taking IV cocaine 3 days ago. appreciate social work consult. HIV antibody negative. Hep panel in process.    Seizure disorder: Reports history of epilepsy. Reports last seizure 3 days ago. No seizure activity Continue home medications.    Bipolar 1 disorder: remains stable at baselin  Anemia: mild and some dilutional. Normocytic. Iron level 13 otherwise anemia panel within limits of normal. Consider supplement  Code Status: full Family Communication: patient at bedside Disposition Plan: home hopefully tomorrow   Consultants:  none  Procedures:  PICC  Antibiotics:  Levaquin 01/08/15>>  Vancomycin 01/08/15>>  HPI/Subjective: Ambulating in hall with steady gait. Complains continued swelling and redness of right elbow/arm.   Objective: Filed Vitals:   01/09/15 0559  BP: 110/51  Pulse: 71  Temp: 98.5 F (36.9 C)  Resp: 20    Intake/Output Summary (Last 24 hours) at 01/09/15 0851 Last data filed at 01/09/15 0600  Gross per 24 hour  Intake    440 ml  Output      0 ml  Net    440 ml   Filed Weights   01/08/15 0239  Weight: 61.236 kg (135 lb)    Exam:   General:  Well nourished ambulating in hall with steady gait  Cardiovascular: s1 and S2. No m/g/r no LE edema  Respiratory: normal effort BS clear bilaterally no wheeze  Abdomen: non-distended +BS soft throughtout  Musculoskeletal: right elbow inner aspect and forearm with less erythema and heat, continued swelling, full rom. Left  wrist with less erythema and swelling.   Data Reviewed: Basic Metabolic Panel:  Recent Labs Lab 01/08/15 0343 01/08/15 0941 01/09/15 0713  NA 139  --  137  K 2.9*  --  4.1  CL 105  --  108  CO2 24  --  24  GLUCOSE 99  --  117*  BUN 5*  --  9  CREATININE 0.71  --  0.56  CALCIUM 8.6*  --  8.0*  MG  --  2.1  --    Liver Function Tests:  Recent Labs Lab 01/09/15 0713  AST 49*  ALT 32  ALKPHOS 88  BILITOT 0.4  PROT 5.6*  ALBUMIN 2.8*   No results for input(s): LIPASE, AMYLASE in the last 168 hours. No results for input(s): AMMONIA in the last 168 hours. CBC:  Recent Labs Lab 01/08/15 0343 01/09/15 0713  WBC 6.6 5.6  NEUTROABS 4.2  --   HGB 10.7* 9.6*  HCT 32.6* 30.0*  MCV 91.6 92.9  PLT 264 219   Cardiac Enzymes: No results for input(s): CKTOTAL, CKMB, CKMBINDEX, TROPONINI in the last 168 hours. BNP (last 3 results) No results for input(s): BNP in the last 8760 hours.  ProBNP (last 3 results) No results for input(s): PROBNP in the last 8760 hours.  CBG: No results for input(s): GLUCAP in the last 168 hours.  Recent Results (from the past 240 hour(s))  Culture, blood (routine x 2)     Status: None (Preliminary result)   Collection Time: 01/08/15  3:43 AM  Result Value Ref Range Status  Specimen Description BLOOD LEFT ARM  Final   Special Requests BOTTLES DRAWN AEROBIC ONLY 7CC DRAWN BY RN  Final   Culture NO GROWTH < 12 HOURS  Final   Report Status PENDING  Incomplete  Culture, blood (routine x 2)     Status: None (Preliminary result)   Collection Time: 01/08/15  3:55 AM  Result Value Ref Range Status   Specimen Description BLOOD LEFT HAND  Final   Special Requests BOTTLES DRAWN AEROBIC ONLY 7CC DRAWN BY RN  Final   Culture NO GROWTH < 12 HOURS  Final   Report Status PENDING  Incomplete     Studies: No results found.  Scheduled Meds: . carbamazepine  150 mg Oral TID  . enoxaparin (LOVENOX) injection  40 mg Subcutaneous Q24H  . gabapentin   300 mg Oral TID  . levofloxacin (LEVAQUIN) IV  750 mg Intravenous Q24H  . sodium chloride  10-40 mL Intracatheter Q12H  . vancomycin  1,000 mg Intravenous Q12H   Continuous Infusions:   Principal Problem:   Cellulitis Active Problems:   Seizure disorder   Hypokalemia   IV drug user   Bipolar 1 disorder   Cellulitis of forearm, left    Time spent: 30 minutes    [facility] M  [facility] Pager [phone]. If 7PM-7AM, please contact night-coverage at [email or web address], password [facility] 01/09/2015, 8:51 AM  LOS: 1 day

## 2015-01-09 NOTE — Care Management Note (Signed)
Case Management Note  Patient Details  Name: Veronica ChuteSamantha L [name] MRN: [id number] Date of Birth: [birth year]  Subjective/Objective:                  Pt admitted from home with cellulitis. Pt lives with her mother and will return home at discharge. Pt is independent with ADL's. Pt receives PCP care at the [facility].  Action/Plan: No CM needs noted. Anticipate discharge within 24 hours.  Expected Discharge Date:      01/10/15            Expected Discharge Plan:  Home/Self Care  In-House Referral:  Clinical Social Work  Discharge planning Services  CM Consult  Post Acute Care Choice:  NA Choice offered to:  NA  DME Arranged:    DME Agency:     HH Arranged:    HH Agency:     Status of Service:  Completed, signed off  Medicare Important Message Given:    Date Medicare IM Given:    Medicare IM give by:    Date Additional Medicare IM Given:    Additional Medicare Important Message give by:     If discussed at Long Length of Stay Meetings, dates discussed:    Additional Comments:  Cheryl FlashBlackwell, Kierre Hintz Crowder, RN 01/09/2015, 10:48 AM

## 2015-01-09 NOTE — Discharge Planning (Signed)
Patient O2 stats remained above 90 on RA at rest and with ambulation/activity. Patient will not need oxygen for discharge home - at this time.             

## 2015-01-09 NOTE — Plan of Care (Signed)
Pt PICC line is still unable to flush or give blood return.  PICC RN has been informed and will assess and fix as needed.

## 2015-01-09 NOTE — Plan of Care (Signed)
Pt and family extremely frustrated that PICC line has not been fixed and don't understand that it's taking so long. Pt stating she is in pain and can't understand why we're not "doing anything." IV lines have been disconnected, no fluids running in and PICC locked. Heat pack also provided. RN tried to explain that PICC nurse needs to prioritize needs and attend to urgent situations first. PICC nurse informed of situation and stated she would return to room when possible.Family and patient informed and RN apologized for the wait.

## 2015-01-09 NOTE — Plan of Care (Signed)
PICC nurse still attempting to un-clot or reposition line. If unable to fix - Dr. Jovita GammaGave verbal order to have PICC RN attempt peripheral with untrasound. IF still unable to get working line - "ok to DC PICC and will change to PO anbx." Plan is to DC home 7/13. Dr. Also informed that no morning anbx have been given - since PICC stopped working.

## 2015-01-09 NOTE — Progress Notes (Signed)
PICC nurse was called this morning by Veronica HazardMatthew, RN saying pt's PICC line was bleeding. [name], PICC RN came to assess and blood was noted on disc, dressing and in bed. No active bleeding was noted at time PICC RN assessed. Pressure dressing was applied for 15 minutes by [name], PICC RN. Dressing was then removed and cleaned up and new dressing applied. Later on Matthew, RN called PICC RN again saying he was unable to flush and aspirate blood from PICC. [name], PICC RN came to assess PICC again and was unable to flush or aspirate blood. [name], PICC RN also attempted to flush and aspirate blood from PICC, but still unsuccessful. Attempt was going to be made to infuse Cathflo through PICC but we were unable to flush anything at all through it, so we wouldn't be able to infuse the Cathflo. [name], [facility] at [facility] and she said to try pulling the PICC line back by 1cm and then try to flush/aspirate to see if the line was up against the side of the vessel. Veronica Kolek, RN pulled line back by 1cm but still unable to flush/aspirate anything. Veronica SmothersKaren Black, NP came to bedside and discussed other options with pt. I told Veronica SmothersKaren Black, NP that PICC was not working and we would need to d/c it but that I would attempt a PIV with ultrasound if she wanted me to. Veronica SmothersKaren Black, NP said to go ahead and attempt PIV. With 1 attempt 22g IV was inserted with no difficulty into pt's lt antecubital area with ultrasound, good blood return was noted and flushed well. PICC line was d/c, vaseline gauze dressing applied and pressure held for 5 minutes. Pressure dressing applied and told pt to leave on for 24 hours. Veronica HazardMatthew, RN was notified of pt's successful PIV attempt and removal of PICC.

## 2015-01-10 DIAGNOSIS — E876 Hypokalemia: Secondary | ICD-10-CM

## 2015-01-10 DIAGNOSIS — G40909 Epilepsy, unspecified, not intractable, without status epilepticus: Secondary | ICD-10-CM

## 2015-01-10 DIAGNOSIS — F1990 Other psychoactive substance use, unspecified, uncomplicated: Secondary | ICD-10-CM

## 2015-01-10 DIAGNOSIS — L03114 Cellulitis of left upper limb: Secondary | ICD-10-CM

## 2015-01-10 LAB — BASIC METABOLIC PANEL
Anion gap: 6 (ref 5–15)
BUN: 10 mg/dL (ref 6–20)
CHLORIDE: 107 mmol/L (ref 101–111)
CO2: 25 mmol/L (ref 22–32)
Calcium: 8.4 mg/dL — ABNORMAL LOW (ref 8.9–10.3)
Creatinine, Ser: 0.68 mg/dL (ref 0.44–1.00)
GFR calc Af Amer: >60 mL/min (ref >60)
GFR calc non Af Amer: >60 mL/min (ref >60)
Glucose, Bld: 86 mg/dL (ref 65–99)
Potassium: 3.9 mmol/L (ref 3.5–5.1)
Sodium: 138 mmol/L (ref 135–145)

## 2015-01-10 LAB — HEPATITIS C ANTIBODY: HCV Ab: 0.1 s/co ratio (ref 0.0–0.9)

## 2015-01-10 LAB — HEPATITIS B SURFACE ANTIGEN: Hepatitis B Surface Ag: NEGATIVE

## 2015-01-10 MED ORDER — SULFAMETHOXAZOLE-TRIMETHOPRIM 800-160 MG PO TABS: 1.0000 | ORAL_TABLET | Freq: Two times a day (BID) | ORAL | Status: DC

## 2015-01-10 MED ORDER — HYDROCODONE-ACETAMINOPHEN 5-325 MG PO TABS: 1.0000 | ORAL_TABLET | Freq: Four times a day (QID) | ORAL | Status: DC | PRN

## 2015-01-10 NOTE — Discharge Summary (Signed)
Physician Discharge Summary  [name]:[id number] DOB: [birth year] DOA: 01/08/2015  PCP: Tilda Burrow, MD  Admit date: 01/08/2015 Discharge date: 01/10/2015  Time spent: 40 minutes  Recommendations for Outpatient Follow-up:  1. Follow up with PCP at health department 1 week for evaluation of cellulitis. Follow hepatitis panel and Hg. Encourage iron supplement 2. Abstain from IV drug use  Discharge Diagnoses:  Principal Problem:   Cellulitis Active Problems:   Seizure disorder   Hypokalemia   IV drug user   Bipolar 1 disorder   Cellulitis of forearm, left   Discharge Condition: stable  Diet recommendation: heart healthy  Filed Weights   01/08/15 0239  Weight: 61.236 kg (135 lb)    History of present illness:  [name] is a 34 y.o. female with a past medical history that includes seizure disorder, bipolar disorder, substance abuse, chronic pain presented to emergency department on 01/08/15 with the chief complaint of right arm swelling erythema and pain. Initial evaluation revealed a cellulitis of right arm inner aspect near the elbow and left wrist as well as hyponatremia. Patient admited to IV drug abuse but reported not using vein and right arm. She reported that she leaned up against the nail 2 days prior. Shortly thereafter right elbow and inner aspect of her forearm began to swell and turn red. Associated symptoms included pain fever reportedly 100.8 orally nausea with 2 episodes of emesis.  She denied abdominal pain dysuria hematuria frequency or urgency. She denied headache visual disturbances syncope or near-syncope. She also reported history of the same which she was hospitalized.   Workup in the emergency department revealed a potassium of 2.9 hemoglobin 10.7 otherwise unremarkable. She was hemodynamically stable afebrile and not hypoxic  Hospital Course:  Cellulitis: likley related to IV drug abuse. Provided with IV vancomycin for 4 doses and 1  dose levaquin in ED. Quickly improved. She remained afebrile and non-toxic appearing. Blood cultures with no growth to date. Will discharge with Bactrim  Active Problems: Hypokalemia; likely related to decreased oral intake. Resolved.   IV drug user: Reports taking IV cocaine 3 days prior. appreciate social work consult. HIV antibody negative. Hep panel in process.    Seizure disorder: Reported history of epilepsy. Reported last seizure 3 days prior to admission. No seizure activity during hospitalization.  Continue home medications.    Bipolar 1 disorder: remained stable at baseline  Anemia: mild and some dilutional. Normocytic. Iron level 13 otherwise anemia panel within limits of normal. Supplement offered patient refused  Procedures:  none  Consultations:  none  Discharge Exam: Filed Vitals:   01/10/15 0557  BP: 104/53  Pulse: 84  Temp: 98 F (36.7 C)  Resp: 20    General: well nourished anxious and irritable ready to be discharge Cardiovascular: RRR no MGR No LE edema Respiratory: normal effort BS clear bilaterally no wheeze Skin: right forearm with very little selling, softer not as tender. Minimal erythema as well. Full rom to right elbow  Discharge Instructions   Discharge Instructions    Diet - low sodium heart healthy    Complete by:  As directed      Discharge instructions    Complete by:  As directed   Follow up with PCP 1 week for evaluation of cellulitis     Increase activity slowly    Complete by:  As directed           Discharge Medication List as of 01/10/2015 10:03 AM    START  taking these medications   Details  sulfamethoxazole-trimethoprim (BACTRIM DS,SEPTRA DS) 800-160 MG per tablet Take 1 tablet by mouth 2 (two) times daily., Starting 01/10/2015, Until Discontinued, Print      CONTINUE these medications which have CHANGED   Details  HYDROcodone-acetaminophen (NORCO/VICODIN) 5-325 MG per tablet Take 1 tablet by mouth every 6 (six)  hours as needed for moderate pain. Take one-two tabs po q 4-6 hrs prn pain, Starting 01/10/2015, Until Discontinued, Print      CONTINUE these medications which have NOT CHANGED   Details  carbamazepine (TEGRETOL) 100 MG chewable tablet Chew 150 mg by mouth 3 (three) times daily. , Until Discontinued, Historical Med    gabapentin (NEURONTIN) 300 MG capsule Take 300 mg by mouth 3 (three) times daily.  , Until Discontinued, Historical Med    albuterol (PROVENTIL HFA;VENTOLIN HFA) 108 (90 BASE) MCG/ACT inhaler Inhale 2 puffs into the lungs every 6 (six) hours as needed. Shortness of breath, Until Discontinued, Historical Med       Allergies  Allergen Reactions  . Peng Thorstenson Pepper [Piper] Anaphylaxis, Itching and Swelling    Swelling and itching of throat  . Coconut Flavor Anaphylaxis and Hives  . Metronidazole Anaphylaxis and Rash  . Penicillins Anaphylaxis    Throat swell shut - states can take Amoxicillin safely  . Rocephin [Ceftriaxone Sodium] Anaphylaxis    unknown  . Propoxyphene N-Acetaminophen Nausea And Vomiting  . Ultram [Tramadol] Nausea And Vomiting and Rash  . Motrin [Ibuprofen]     rash   Follow-up Information    Please follow up.   Why:  follow up 1 week    Contact information:   health department       The results of significant diagnostics from this hospitalization (including imaging, microbiology, ancillary and laboratory) are listed below for reference.    Significant Diagnostic Studies: No results found.  Microbiology: Recent Results (from the past 240 hour(s))  Culture, blood (routine x 2)     Status: None (Preliminary result)   Collection Time: 01/08/15  3:43 AM  Result Value Ref Range Status   Specimen Description BLOOD LEFT ARM DRAWN BY RN  Final   Special Requests BOTTLES DRAWN AEROBIC ONLY 7CC  Final   Culture NO GROWTH 2 DAYS  Final   Report Status PENDING  Incomplete  Culture, blood (routine x 2)     Status: None (Preliminary result)   Collection  Time: 01/08/15  3:55 AM  Result Value Ref Range Status   Specimen Description BLOOD LEFT HAND DRAWN BY RN  Final   Special Requests BOTTLES DRAWN AEROBIC ONLY 7CC  Final   Culture NO GROWTH 2 DAYS  Final   Report Status PENDING  Incomplete     Labs: Basic Metabolic Panel:  Recent Labs Lab 01/08/15 0343 01/08/15 0941 01/09/15 0713 01/10/15 0627  NA 139  --  137 138  K 2.9*  --  4.1 3.9  CL 105  --  108 107  CO2 24  --  24 25  GLUCOSE 99  --  117* 86  BUN 5*  --  9 10  CREATININE 0.71  --  0.56 0.68  CALCIUM 8.6*  --  8.0* 8.4*  MG  --  2.1  --   --    Liver Function Tests:  Recent Labs Lab 01/09/15 0713  AST 49*  ALT 32  ALKPHOS 88  BILITOT 0.4  PROT 5.6*  ALBUMIN 2.8*   No results for input(s): LIPASE, AMYLASE in the  last 168 hours. No results for input(s): AMMONIA in the last 168 hours. CBC:  Recent Labs Lab 01/08/15 0343 01/09/15 0713  WBC 6.6 5.6  NEUTROABS 4.2  --   HGB 10.7* 9.6*  HCT 32.6* 30.0*  MCV 91.6 92.9  PLT 264 219   Cardiac Enzymes: No results for input(s): CKTOTAL, CKMB, CKMBINDEX, TROPONINI in the last 168 hours. BNP: BNP (last 3 results) No results for input(s): BNP in the last 8760 hours.  ProBNP (last 3 results) No results for input(s): PROBNP in the last 8760 hours.  CBG: No results for input(s): GLUCAP in the last 168 hours.     SignedGwenyth Bender  [facility] 01/10/2015, 11:03 AM

## 2015-01-10 NOTE — Progress Notes (Signed)
Patient with orders to be discharged home. Discharge instructions given, patient verbalized understanding. Prescriptions given. Patient stable. Patient left in private vehicle with mother.

## 2015-01-10 NOTE — Progress Notes (Signed)
I walked into the pt's room and it smelled of cigarettes. I explained to the pt that no tobacco use is allowed in the hospital. She agreed to give me her cigarettes and theres was only one in there. She also gave me her lighter. I explained to pt that these items would be returned to her once she is discharged.Will continue to monitor.

## 2015-01-10 NOTE — Care Management Note (Signed)
Case Management Note  Patient Details  Name: [name] MRN: [id number] Date of Birth: 1980/08/24   Expected Discharge Date:                  Expected Discharge Plan:  Home/Self Care  In-House Referral:  Clinical Social Work  Discharge planning Services  CM Consult  Post Acute Care Choice:  NA Choice offered to:  NA  DME Arranged:    DME Agency:     HH Arranged:    HH Agency:     Status of Service:  Completed, signed off  Medicare Important Message Given:    Date Medicare IM Given:    Medicare IM give by:    Date Additional Medicare IM Given:    Additional Medicare Important Message give by:     If discussed at Long Length of Stay Meetings, dates discussed:    Additional Comments: Pt being discharged home today with self care. No CM needs.  Malcolm Metrohildress, Ginny Loomer Demske, RN 01/10/2015, 9:57 AM

## 2015-01-15 LAB — CULTURE, BLOOD (ROUTINE X 2)
CULTURE: NO GROWTH
Culture: NO GROWTH

## 2015-02-08 IMAGING — CR DG SHOULDER 2+V*R*
3 series · 3 of 3 positions shown · non-contrast
Comparison: None.

CLINICAL DATA: Fall, pain

RIGHT SHOULDER - 2+ VIEW

[view not recorded (1 of 3)]
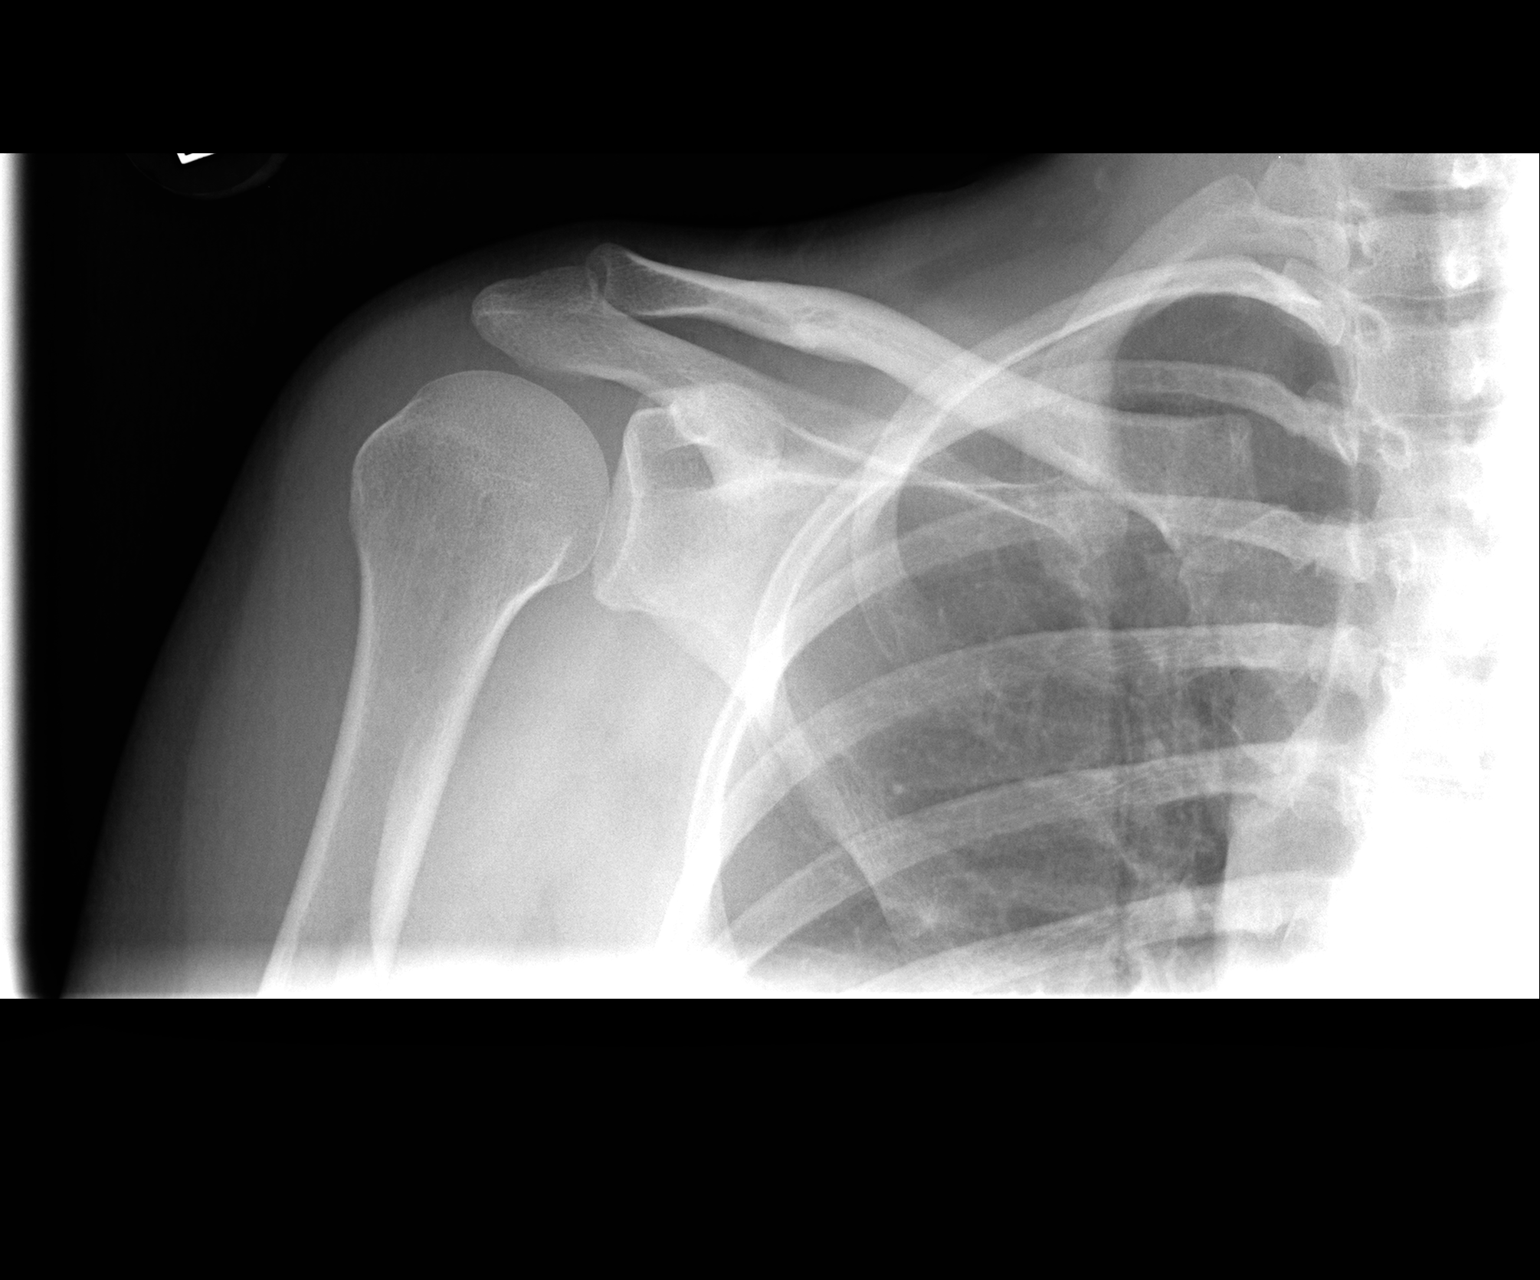

[view not recorded (2 of 3)]
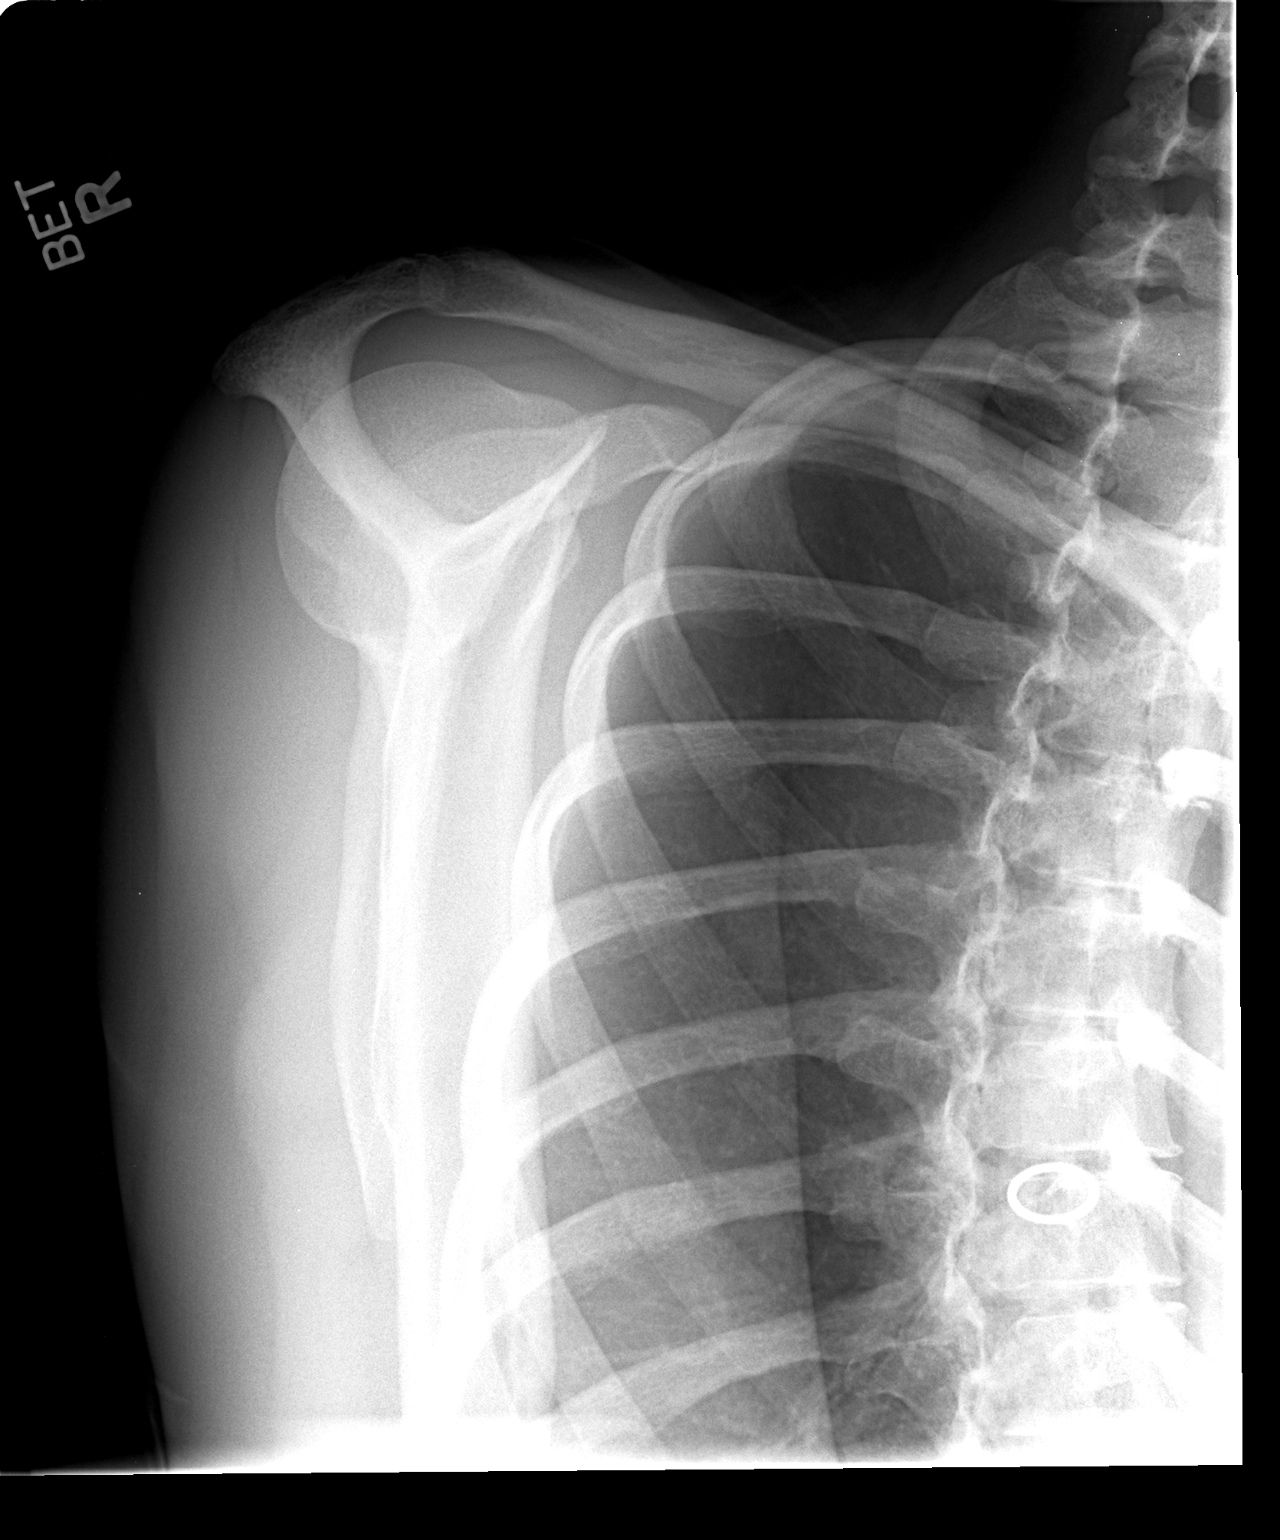

[view not recorded (3 of 3)]
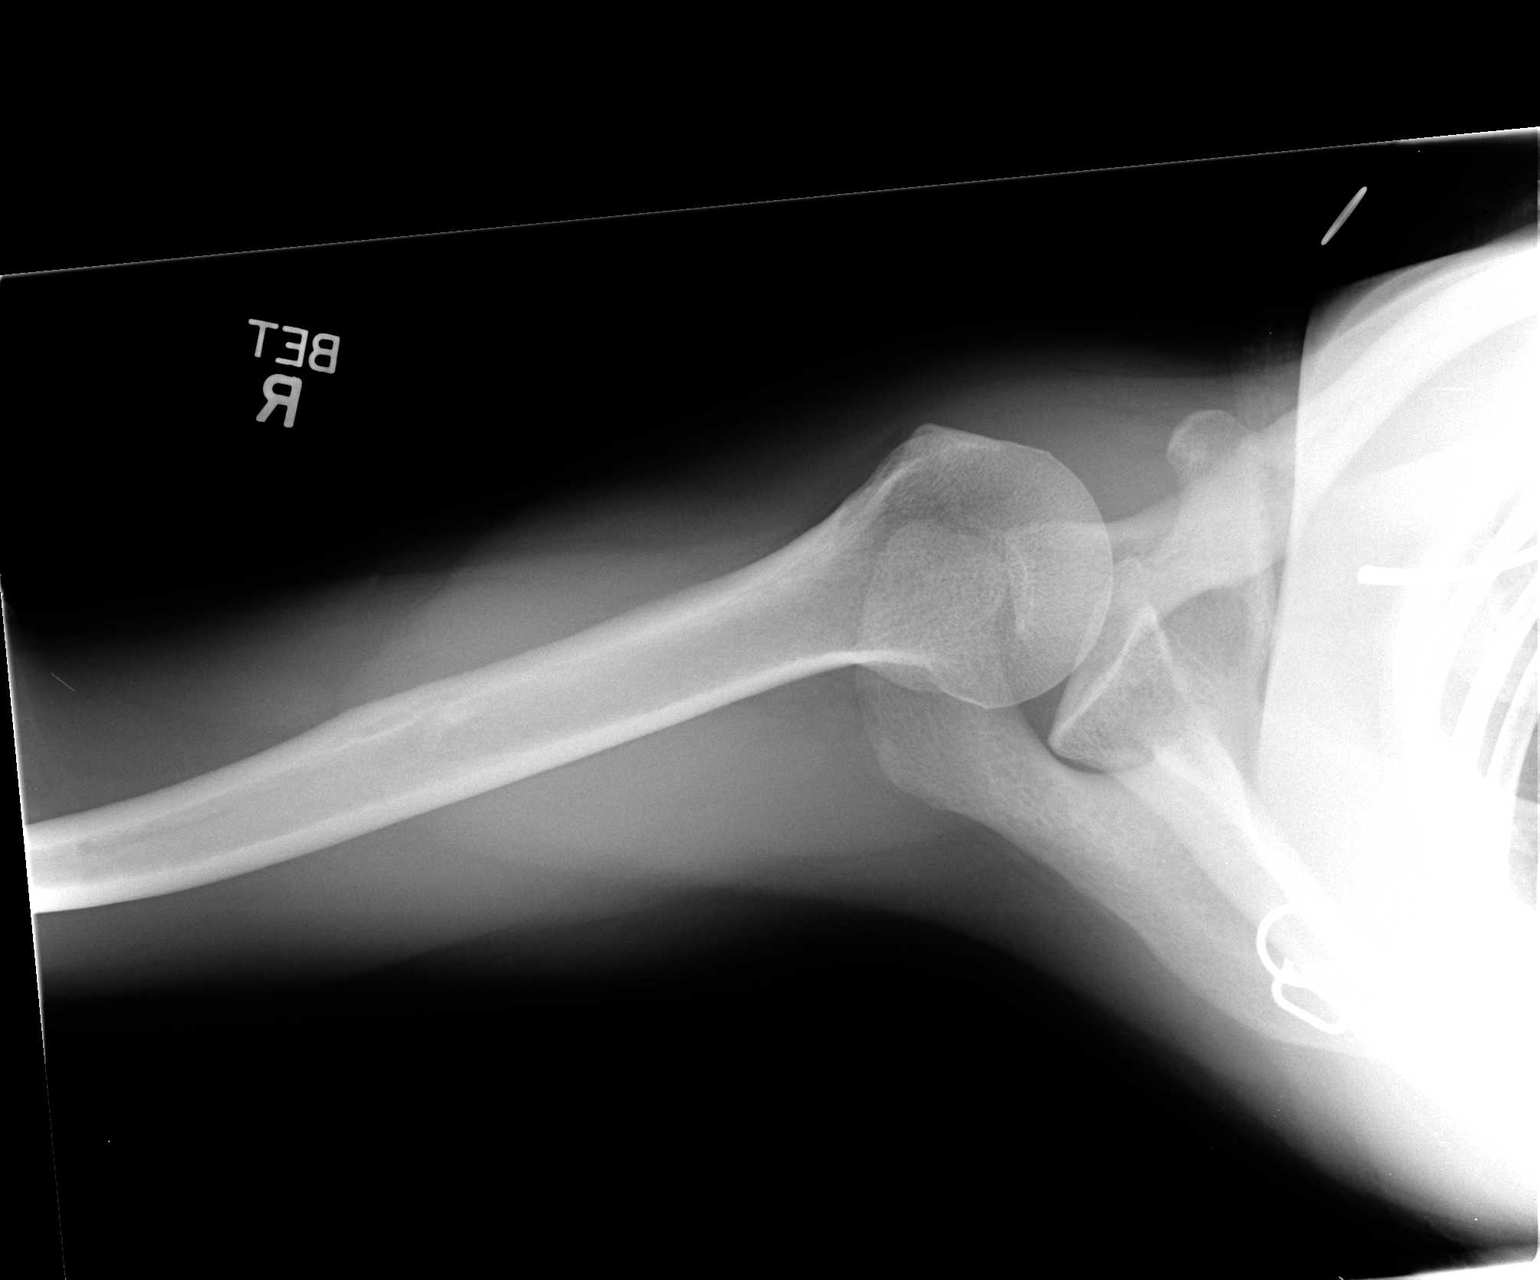

[3 of 3 positions shown; findings below may reference images not displayed]

FINDINGS: Normal alignment without fracture.  Preserved joint
spaces.  AC joint unremarkable.  Right upper lobe clear.
Visualized right ribs intact.
IMPRESSION: No acute finding

## 2015-04-04 ENCOUNTER — Emergency Department (HOSPITAL_COMMUNITY)
Admission: EM | Admit: 2015-04-04 | Discharge: 2015-04-04 | Discharge status: Against Medical Advice | Payer: Medicaid Other | Attending: Emergency Medicine | Admitting: Emergency Medicine

## 2015-04-04 ENCOUNTER — Encounter (HOSPITAL_COMMUNITY): Payer: Self-pay | Admitting: Emergency Medicine

## 2015-04-04 DIAGNOSIS — Y9389 Activity, other specified: Secondary | ICD-10-CM | POA: Insufficient documentation

## 2015-04-04 DIAGNOSIS — L089 Local infection of the skin and subcutaneous tissue, unspecified: Secondary | ICD-10-CM | POA: Insufficient documentation

## 2015-04-04 DIAGNOSIS — T148XXA Other injury of unspecified body region, initial encounter: Secondary | ICD-10-CM

## 2015-04-04 DIAGNOSIS — Y998 Other external cause status: Secondary | ICD-10-CM | POA: Insufficient documentation

## 2015-04-04 DIAGNOSIS — Y9289 Other specified places as the place of occurrence of the external cause: Secondary | ICD-10-CM | POA: Diagnosis not present

## 2015-04-04 DIAGNOSIS — S41132A Puncture wound without foreign body of left upper arm, initial encounter: Secondary | ICD-10-CM | POA: Insufficient documentation

## 2015-04-04 DIAGNOSIS — S41131A Puncture wound without foreign body of right upper arm, initial encounter: Secondary | ICD-10-CM | POA: Insufficient documentation

## 2015-04-04 DIAGNOSIS — G8929 Other chronic pain: Secondary | ICD-10-CM | POA: Diagnosis not present

## 2015-04-04 DIAGNOSIS — L0390 Cellulitis, unspecified: Secondary | ICD-10-CM | POA: Diagnosis present

## 2015-04-04 DIAGNOSIS — W460XXA Contact with hypodermic needle, initial encounter: Secondary | ICD-10-CM | POA: Diagnosis not present

## 2015-04-04 NOTE — ED Notes (Addendum)
Pt states after injection of Cocaine in the right and left arms on Saturday she noted swelling and redness to the injection areas on Sunday. Pt denies any drug use since Saturday.

## 2015-04-04 NOTE — ED Notes (Addendum)
Swelling to multiple injection sites on bilateral upper extremities. No erythema noted. Denies fever. Pt reports using IV cocaine on Saturday.

## 2015-04-04 NOTE — ED Notes (Signed)
Pt at nurse's station asking when she will be seen by edp. RN states to patient the edpa had signed up to see her and she was next to be seen.

## 2015-04-04 NOTE — ED Notes (Signed)
Pt walked by nurse's station and stated, "im leaving, this is bullshit".

## 2015-04-24 ENCOUNTER — Other Ambulatory Visit: Payer: Medicaid Other | Admitting: Obstetrics and Gynecology

## 2016-03-25 ENCOUNTER — Encounter (HOSPITAL_COMMUNITY): Payer: Self-pay | Admitting: Emergency Medicine

## 2016-03-25 ENCOUNTER — Emergency Department (HOSPITAL_COMMUNITY): Payer: Medicaid Other

## 2016-03-25 ENCOUNTER — Emergency Department (HOSPITAL_COMMUNITY)
Admission: EM | Admit: 2016-03-25 | Discharge: 2016-03-25 | Disposition: A | Discharge status: Home / Self Care | Payer: Medicaid Other | Attending: Emergency Medicine | Admitting: Emergency Medicine

## 2016-03-25 DIAGNOSIS — L03115 Cellulitis of right lower limb: Secondary | ICD-10-CM | POA: Insufficient documentation

## 2016-03-25 DIAGNOSIS — L02413 Cutaneous abscess of right upper limb: Secondary | ICD-10-CM | POA: Insufficient documentation

## 2016-03-25 DIAGNOSIS — L0291 Cutaneous abscess, unspecified: Secondary | ICD-10-CM

## 2016-03-25 DIAGNOSIS — L0390 Cellulitis, unspecified: Secondary | ICD-10-CM

## 2016-03-25 DIAGNOSIS — Z79899 Other long term (current) drug therapy: Secondary | ICD-10-CM | POA: Insufficient documentation

## 2016-03-25 DIAGNOSIS — J45909 Unspecified asthma, uncomplicated: Secondary | ICD-10-CM | POA: Insufficient documentation

## 2016-03-25 DIAGNOSIS — F17210 Nicotine dependence, cigarettes, uncomplicated: Secondary | ICD-10-CM | POA: Insufficient documentation

## 2016-03-25 HISTORY — DX: Acute pancreatitis without necrosis or infection, unspecified: K85.90

## 2016-03-25 LAB — BASIC METABOLIC PANEL
ANION GAP: 5 (ref 5–15)
BUN: 7 mg/dL (ref 6–20)
CHLORIDE: 103 mmol/L (ref 101–111)
CO2: 27 mmol/L (ref 22–32)
Calcium: 9.1 mg/dL (ref 8.9–10.3)
Creatinine, Ser: 0.67 mg/dL (ref 0.44–1.00)
GFR calc non Af Amer: >60 mL/min (ref >60)
GLUCOSE: 61 mg/dL — AB (ref 65–99)
POTASSIUM: 3.6 mmol/L (ref 3.5–5.1)
Sodium: 135 mmol/L (ref 135–145)

## 2016-03-25 LAB — CBC
HEMATOCRIT: 38.7 % (ref 36.0–46.0)
HEMOGLOBIN: 12.3 g/dL (ref 12.0–15.0)
MCH: 27.2 pg (ref 26.0–34.0)
MCHC: 31.8 g/dL (ref 30.0–36.0)
MCV: 85.6 fL (ref 78.0–100.0)
Platelets: 360 10*3/uL (ref 150–400)
RBC: 4.52 MIL/uL (ref 3.87–5.11)
RDW: 15.8 % — ABNORMAL HIGH (ref 11.5–15.5)
WBC: 9.1 10*3/uL (ref 4.0–10.5)

## 2016-03-25 LAB — CARBAMAZEPINE LEVEL, TOTAL: Carbamazepine Lvl: <2 ug/mL — ABNORMAL LOW (ref 4.0–12.0)

## 2016-03-25 MED ORDER — SODIUM CHLORIDE 0.9 % IV SOLN: Freq: Once | INTRAVENOUS | Status: DC

## 2016-03-25 MED ORDER — SULFAMETHOXAZOLE-TRIMETHOPRIM 800-160 MG PO TABS: 1.0000 | ORAL_TABLET | Freq: Two times a day (BID) | ORAL | 0 refills | Status: AC

## 2016-03-25 MED ORDER — CLINDAMYCIN HCL 150 MG PO CAPS: 300.0000 mg | ORAL_CAPSULE | Freq: Three times a day (TID) | ORAL | 0 refills | Status: AC

## 2016-03-25 MED ORDER — SODIUM CHLORIDE 0.9 % IV BOLUS (SEPSIS)
1000.0000 mL | Freq: Once | INTRAVENOUS | Status: AC
  Administered 2016-03-25: 1000 mL via INTRAVENOUS

## 2016-03-25 MED ORDER — CLINDAMYCIN PHOSPHATE 600 MG/50ML IV SOLN
600.0000 mg | Freq: Once | INTRAVENOUS | Status: AC
Start: 2016-03-25 — End: 2016-03-25
  Administered 2016-03-25: 600 mg via INTRAVENOUS
  Filled 2016-03-25: qty 50

## 2016-03-25 MED ORDER — CEPHALEXIN 500 MG PO CAPS: 500.0000 mg | ORAL_CAPSULE | Freq: Three times a day (TID) | ORAL | 0 refills | Status: AC

## 2016-03-25 MED ORDER — OXYCODONE-ACETAMINOPHEN 5-325 MG PO TABS
2.0000 | ORAL_TABLET | Freq: Once | ORAL | Status: AC
  Administered 2016-03-25: 2 via ORAL
  Filled 2016-03-25: qty 2

## 2016-03-25 MED ORDER — NAPROXEN 375 MG PO TABS: 375.0000 mg | ORAL_TABLET | Freq: Two times a day (BID) | ORAL | 0 refills | Status: AC | PRN

## 2016-03-25 MED ORDER — MORPHINE SULFATE (PF) 2 MG/ML IV SOLN
2.0000 mg | Freq: Once | INTRAVENOUS | Status: AC
  Administered 2016-03-25: 2 mg via INTRAVENOUS
  Filled 2016-03-25: qty 1

## 2016-03-25 NOTE — ED Triage Notes (Signed)
Pt has abscess to right elbow x3 days, with small amount of drainage.  Has large amount of swelling and redness.  Pt also has abscess to left ankle, not draining at this time. Pt has had intermittent fevers.

## 2016-03-25 NOTE — Consult Note (Signed)
SURGICAL CONSULTATION NOTE (initial) - cpt's: 16109: 99243 + 10061  HISTORY OF PRESENT ILLNESS (HPI):  35 y.o. female presented to [facility] ED with worsening Right proximal-lateral forearm redness and pain x 3 days and fever at home to 102F after she hit her arm when she fell during a seizure 3 days ago. She states her last IV drug use was 3 months ago, and today she started to note a small amount of drainage from the focally red and swollen area of her forearm. Because it wasn't improving and was worsening, she agreed to present to the ED as encouraged by her mother. She acknowledges that she has had another similar abscess drained on her arm that required daily packing changes that were performed by her mom. She otherwise states it's become more difficulty to move her arm due to the pain, and she denies any CP or SOB.  Surgery is consulted by ED physician Dr. Erma HeritageIsaacs in this context for evaluation and management of Right proximal-lateral forearm abscess.  PAST MEDICAL HISTORY (PMH):  Past Medical History:  Diagnosis Date  . Anemia   . Asthma   . Bipolar 1 disorder (HCC)   . Chronic back pain   . Diverticulitis   . Pancreatitis   . Renal disorder    kidney stones  . Sciatica   . Seizures (HCC)   . Substance abuse      PAST SURGICAL HISTORY (PSH):  Past Surgical History:  Procedure Laterality Date  . ABDOMINAL HYSTERECTOMY    . ABDOMINAL SURGERY    . CHOLECYSTECTOMY    . COLONOSCOPY N/A 11/24/2012   Procedure: COLONOSCOPY;  Surgeon: Malissa HippoNajeeb U Rehman, MD;  Location: [facility];  Service: Endoscopy;  Laterality: N/A;  . DILATION AND CURETTAGE OF UTERUS    . MANDIBLE SURGERY       MEDICATIONS:  Prior to Admission medications   Medication Sig Start Date End Date Taking? Authorizing Provider  acetaminophen (TYLENOL) 500 MG tablet Take 500 mg by mouth every 6 (six) hours as needed for mild pain or moderate pain.   Yes Historical Provider, MD  albuterol (PROVENTIL HFA;VENTOLIN HFA) 108 (90 BASE)  MCG/ACT inhaler Inhale 2 puffs into the lungs every 6 (six) hours as needed. Shortness of breath   Yes Historical Provider, MD  carbamazepine (TEGRETOL) 100 MG chewable tablet Chew 150 mg by mouth 3 (three) times daily.     Historical Provider, MD  gabapentin (NEURONTIN) 300 MG capsule Take 300 mg by mouth 3 (three) times daily.      Historical Provider, MD     ALLERGIES:  Allergies  Allergen Reactions  . Black Pepper [Piper] Anaphylaxis, Itching and Swelling    Swelling and itching of throat  . Coconut Flavor Anaphylaxis and Hives  . Metronidazole Anaphylaxis and Rash  . Penicillins Anaphylaxis    Throat swell shut - states can take Amoxicillin safely  . Rocephin [Ceftriaxone Sodium] Anaphylaxis    unknown  . Propoxyphene N-Acetaminophen Nausea And Vomiting  . Ultram [Tramadol] Nausea And Vomiting and Rash  . Motrin [Ibuprofen]     rash     SOCIAL HISTORY:  Social History   Social History  . Marital status: Single    Spouse name: N/A  . Number of children: N/A  . Years of education: N/A   Occupational History  . Not on file.   Social History Main Topics  . Smoking status: Current Every Day Smoker    Packs/day: 1.00    Types: Cigarettes  .  Smokeless tobacco: Never Used  . Alcohol use No     Comment: occ  . Drug use:     Types: IV, Cocaine  . Sexual activity: Yes    Birth control/ protection: Surgical   Other Topics Concern  . Not on file   Social History Narrative  . No narrative on file    FAMILY HISTORY:  Family History  Problem Relation Age of Onset  . Cancer Mother     bone  . Asthma Sister   . Asthma Daughter   . Asthma Son   . Diabetes Maternal Grandmother   . Cancer Maternal Grandfather   . Asthma Son   . Asthma Son     REVIEW OF SYSTEMS:  Constitutional: denies weight loss, fever, chills, or sweats  Eyes: denies any other vision changes, history of eye injury  ENT: denies sore throat, hearing problems  Respiratory: denies shortness of  breath, wheezing  Cardiovascular: denies chest pain, palpitations  Gastrointestinal: denies abdominal pain, N/V, or diarrhea Genitourinary: denies burning with urination or urinary frequency Musculoskeletal: as per HPI, otherwise denies any other joint pains or cramps  Skin: as per HPI, otherwise denies any other rashes or skin discolorations  Neurological: denies any other headache, dizziness, weakness  Psychiatric: denies any other depression, anxiety   All other review of systems were negative   VITAL SIGNS:  Temp:  [97.8 F (36.6 C)] 97.8 F (36.6 C) (09/26 1246) Pulse Rate:  [89-96] 96 (09/26 1530) Resp:  [16-18] 16 (09/26 1530) BP: (97-111)/(59-61) 97/59 (09/26 1530) SpO2:  [97 %-99 %] 97 % (09/26 1530) Weight:  [61.2 kg (135 lb)] 61.2 kg (135 lb) (09/26 1247)     Height: 5\' 7"  (170.2 cm) Weight: 61.2 kg (135 lb) BMI (Calculated): 21.2   INTAKE/OUTPUT:  This shift: No intake/output data recorded.  Last 2 shifts: @IOLAST2SHIFTS @   PHYSICAL EXAM:  Constitutional:  -- Very thin body habitus  -- Awake, alert, and oriented x3  Eyes:  -- Pupils equally round and reactive to light  -- No scleral icterus  Ear, nose, and throat:  -- No jugular venous distension  Pulmonary:  -- No crackles  -- Equal breath sounds bilaterally -- Breathing non-labored at rest Cardiovascular:  -- S1, S2 present  -- No pericardial rubs Gastrointestinal:  -- Abdomen soft, nontender, nondistended, no guarding/rebound  -- No abdominal masses appreciated, pulsatile or otherwise  Musculoskeletal / Integumentary:  -- Wounds or skin discoloration: 3-4 cm area of redness, swelling, and warmth, exquisitely tender to palpation and draining a small amount of purulent fluid through a pinhole opening -- Extremities: B/L UE and LE FROM, hands and feet warm, otherwise no edema  Neurologic:  -- Motor function: intact and symmetric -- Sensation: intact and symmetric  Labs:  CBC:  Lab Results  Component  Value Date   WBC 9.1 03/25/2016   RBC 4.52 03/25/2016   BMP:  Lab Results  Component Value Date   GLUCOSE 61 (L) 03/25/2016   CO2 27 03/25/2016   BUN 7 03/25/2016   CREATININE 0.67 03/25/2016   CALCIUM 9.1 03/25/2016     Imaging studies:  Right elbow x-ray (03/25/2016) - personally reviewed with 7 mm superficial linear radiopacity in patient's distal Right arm, several cm from abscess 1. Diffuse right elbow soft tissue swelling, most prominent laterally. Linear 7 mm metallic foreign body in the anterior soft tissues at the level of the distal right humerus, with the appearance of a needle fragment . 2. No radiographic  findings of osteomyelitis. No appreciable soft tissue gas. 3. No fracture, joint effusion, malalignment or appreciable arthropathy in the right elbow.  Assessment/Plan: (ICD-10's: L44.413) 35 y.o. female with Right proximal-lateral forearm cutaneous abscess, complicated by pertinent comorbidities including polysubstance abuse including IV drug use, bipolar disorder, seizure disorder, and asthma.   - antibiotics as per ED physician  - Right proximal-lateral forearm abscess incision and drainage   - #11 blade scalpel, 1.5 cm incision, loculations disrupted with hemostat, ~30 mL purulent fluid obtained, packed with 2" x 2" gauze  - no indication for Right distal arm exploration and surgical excision of foreign body remote from abscess   - daily packing changes discussed with patient, her mother (present), and ED physician  - follow-up deep fluid (abscess) and blood cultures (obtained prior to antibiotics)  - outpatient surgical follow-up for wound evaluation in 2 weeks  - patient and her mother advised to call if any questions  - IV drug use abstinence encouraged  - DVT prophylaxis  All of the above findings and recommendations were discussed with the patient and her family, and all of her and family's questions were answered to their expressed satisfaction.  Thank  you for the opportunity to participate in this patient's care.   -- Scherrie Gerlach Earlene Plater, MD, RPVI [place]: [facility] General Surgery [facility] Office: [phone]

## 2016-03-25 NOTE — Discharge Instructions (Signed)
Follow-up with your doctor in 48 hours for wound check  If you can not follow-up with your doctor, you can return to the ED as needed

## 2016-03-25 NOTE — ED Provider Notes (Addendum)
Assumed care from Dr. Jodi MourningZavitz at 3 PM. Briefly, the patient is a 35 yo F with PMHx of IVDU here with right elbow abscess. Surgery (Dr. Earlene Plateravis) consulted.   Labs Reviewed  CBC - Abnormal; Notable for the following:       Result Value   RDW 15.8 (*)    All other components within normal limits  BASIC METABOLIC PANEL - Abnormal; Notable for the following:    Glucose, Bld 61 (*)    All other components within normal limits  CULTURE, BLOOD (ROUTINE X 2)  CULTURE, BLOOD (ROUTINE X 2)  AEROBIC CULTURE (SUPERFICIAL SPECIMEN)  CARBAMAZEPINE LEVEL, TOTAL    Course of Care: Abscess I&D'ed by Dr. Earlene Plateravis at bedside, who has cleared patient for discharge to home. Patient has normal HR, BP, WBC, and no signs of sepsis. Will d/c with bactrim/keflex (pt adamant she has taken both before despite listed allergies, does not want clinda due to cost), outpt f/u as above, and good return precautions. Pt is in agreement. Dr. Earlene Plateravis has instructed on packing.  Clinical Impression: 1. Abscess and cellulitis   2. Abscess     Disposition: Discharge  Condition: Good  I have discussed the results, Dx and Tx plan with the pt(& family if present). He/she/they expressed understanding and agree(s) with the plan. Discharge instructions discussed at great length. Strict return precautions discussed and pt &/or family have verbalized understanding of the instructions. No further questions at time of discharge.   Follow Up: Verdell Faceochelle D. Muse, PA-C [address] [place] [address] [phone]  In 3 days As needed  [facility] [address] [id number] [facility] [place] [id number] [phone]  If symptoms worsen  Ancil LinseyJason Evan Davis, MD [address] [place] [address] [phone]  In 2 weeks For wound check and follow-up       Shaune Pollackameron Brand Siever, MD 03/26/16 0045    Shaune Pollackameron Elsey Holts, MD 03/26/16 [phone]

## 2016-03-25 NOTE — ED Provider Notes (Signed)
[facility] Provider Note   CSN: [id number] Arrival date & time: 03/25/16  1241     History   Chief Complaint Chief Complaint  Patient presents with  . Abscess    HPI  [name] is a 35 y.o. female with a history of IV drug use, bipolar, h/o seizure on tegretol presents with swelling of right elbow and redness of right foot. She states they both appeared three days ago after falling from a seizure. She has not taken her seizure medication in one week due to lapse in insurance. She denies known trauma after the fall, but thinks she must have had a scratch after the fall. She reports fever to a to max 102F. She reports decreased range of motion of her right elbow due to pain and has has small amount of drainage from her elbow. Otherwise she reports chronic cough from which she attributes to asthma but denies shortness of breath, denies chest pain, nausea, emesis, diarrhea She denies recent drug use and state that she has not " shot up" in several months. She reports only cigarette use, else denies etoh and drugs  Past Medical History:  Diagnosis Date  . Anemia   . Asthma   . Bipolar 1 disorder (HCC)   . Chronic back pain   . Diverticulitis   . Pancreatitis   . Renal disorder    kidney stones  . Sciatica   . Seizures (HCC)   . Substance abuse     Patient Active Problem List   Diagnosis Date Noted  . IV drug user 01/08/2015  . Cellulitis 01/08/2015  . Bipolar 1 disorder (HCC)   . Seizures (HCC)   . Cellulitis of forearm, left   . Chronic female pelvic pain 08/01/2013  . At risk for abuse of opiates 08/01/2013  . Contusion of shoulder 01/25/2013  . Contusion, wrist 01/25/2013  . Hematochezia 11/23/2012  . Right lower quadrant abdominal pain 11/23/2012  . Seizure disorder (HCC) 11/23/2012  . Hypokalemia 11/23/2012  . Anemia due to blood loss, acute 11/23/2012  . CONSTIPATION 09/28/2009  . CELLULITIS, ARM 09/28/2009  . VOMITING 09/28/2009  . ABDOMINAL  PAIN 09/28/2009    Past Surgical History:  Procedure Laterality Date  . ABDOMINAL HYSTERECTOMY    . ABDOMINAL SURGERY    . CHOLECYSTECTOMY    . COLONOSCOPY N/A 11/24/2012   Procedure: COLONOSCOPY;  Surgeon: Malissa Hippo, MD;  Location: [facility];  Service: Endoscopy;  Laterality: N/A;  . DILATION AND CURETTAGE OF UTERUS    . MANDIBLE SURGERY      OB History    No data available       Home Medications    Prior to Admission medications   Medication Sig Start Date End Date Taking? Authorizing Provider  acetaminophen (TYLENOL) 500 MG tablet Take 500 mg by mouth every 6 (six) hours as needed for mild pain or moderate pain.   Yes Historical Provider, MD  albuterol (PROVENTIL HFA;VENTOLIN HFA) 108 (90 BASE) MCG/ACT inhaler Inhale 2 puffs into the lungs every 6 (six) hours as needed. Shortness of breath   Yes Historical Provider, MD  carbamazepine (TEGRETOL) 100 MG chewable tablet Chew 150 mg by mouth 3 (three) times daily.     Historical Provider, MD  gabapentin (NEURONTIN) 300 MG capsule Take 300 mg by mouth 3 (three) times daily.      Historical Provider, MD    Family History Family History  Problem Relation Age of Onset  . Cancer Mother  bone  . Asthma Sister   . Asthma Daughter   . Asthma Son   . Diabetes Maternal Grandmother   . Cancer Maternal Grandfather   . Asthma Son   . Asthma Son     Social History Social History  Substance Use Topics  . Smoking status: Current Every Day Smoker    Packs/day: 1.00    Types: Cigarettes  . Smokeless tobacco: Never Used  . Alcohol use No     Comment: occ     Allergies   Black pepper [piper]; Coconut flavor; Metronidazole; Penicillins; Rocephin [ceftriaxone sodium]; Propoxyphene n-acetaminophen; Ultram [tramadol]; and Motrin [ibuprofen]   Review of Systems Review of Systems  Constitutional: Positive for fever.  HENT: Negative.   Respiratory: Negative.   Cardiovascular: Negative.   Gastrointestinal: Negative.     Musculoskeletal:       Swelling, redness, warmth of right arm adjacent to elbow,   Skin: Positive for rash.     Physical Exam Updated Vital Signs BP 111/61 (BP Location: Left Arm)   Pulse 89   Temp 97.8 F (36.6 C) (Oral)   Resp 18   Ht 5\' 7"  (1.702 m)   Wt 61.2 kg   SpO2 99%   BMI 21.14 kg/m   Physical Exam  Constitutional: She is oriented to person, place, and time. She appears distressed.  Appears older than stated age  HENT:  Head: Normocephalic and atraumatic.  Eyes: EOM are normal. Pupils are equal, round, and reactive to light.  Neck: Normal range of motion.  Cardiovascular: Normal rate, regular rhythm and normal heart sounds.   No murmur heard. Pulmonary/Chest: Effort normal and breath sounds normal. No respiratory distress.  Abdominal: Soft. Bowel sounds are normal. She exhibits no distension.  Musculoskeletal: She exhibits tenderness.  Neurological: She is alert and oriented to person, place, and time.  Skin: Rash noted.  Lateral aspect of right elbow tender, warm, erythematous flucuant swelling approximately 4 cm in diameter, no drainage noted 1 cm diameter area of induration over dorsum of right foot  Psychiatric: She has a normal mood and affect.    ED Treatments / Results  Labs (all labs ordered are listed, but only abnormal results are displayed) Labs Reviewed  CBC - Abnormal; Notable for the following:       Result Value   RDW 15.8 (*)    All other components within normal limits  CULTURE, BLOOD (ROUTINE X 2)  CULTURE, BLOOD (ROUTINE X 2)  BASIC METABOLIC PANEL  CARBAMAZEPINE LEVEL, TOTAL    EKG  EKG Interpretation None       Radiology Dg Elbow Complete Right  Result Date: 03/25/2016 CLINICAL DATA:  Lateral right elbow pain, erythema and edema with draining abscess. Recent fall. EXAM: RIGHT ELBOW - COMPLETE 3+ VIEW COMPARISON:  None. FINDINGS: Prominent soft tissue swelling throughout the right elbow, most prominent laterally. No  appreciable soft tissue gas. There is a 7 mm linear metallic foreign body in the anterior soft tissues at the level of the distal right humerus. No fracture, joint effusion, dislocation or suspicious focal osseous lesion. No cortical erosions or periosteal reaction. No appreciable degenerative or erosive arthropathy. IMPRESSION: 1. Diffuse right elbow soft tissue swelling, most prominent laterally. Linear 7 mm metallic foreign body in the anterior soft tissues at the level of the distal right humerus, with the appearance of a needle fragment . 2. No radiographic findings of osteomyelitis. No appreciable soft tissue gas. 3. No fracture, joint effusion, malalignment or appreciable arthropathy in  the right elbow. Electronically Signed   By: Delbert PhenixJason A Poff M.D.   On: 03/25/2016 15:09    Procedures Procedures (including critical care time)  Medications Ordered in ED Medications  clindamycin (CLEOCIN) IVPB 600 mg (not administered)  morphine 2 MG/ML injection 2 mg (2 mg Intravenous Given 03/25/16 1521)     Initial Impression / Assessment and Plan / ED Course  I have reviewed the triage vital signs and the nursing notes.  Pertinent labs & imaging results that were available during my care of the patient were reviewed by me and considered in my medical decision making (see chart for details).  Clinical Course    35 y/o F with history of IV drug abuse, seizure disorder not taking tegretol presents for right arm abscess with foreign body concerning for broken needle and right foot cellulitis. She was afebrile in the ED with normal vitals signs. She had an xray of her right elbow with showed a piece metal in her arm, consistent with a needle tip. Additionally she had a bed side ultrasound to further evaluate her abscess. She was started on IV clindamycin and general surgery was consulted to consideration of incision and drainage in the OR given presence of a needle tip foreign body. She was discharged in  stable to complete a course of oral antibiotics for abscess and cellulitis of right foot Additionally a tegretol level was obtained given she has not used it in one week. She was directed to follow up wit the health department to obtain more medication  Final Clinical Impressions(s) / ED Diagnoses   Final diagnoses:  Abscess  Cellulitis of foot, right    New Prescriptions New Prescriptions   No medications on file     Bonney AidAlyssa A Jaydis Duchene, MD 03/25/16 1553    Blane OharaJoshua Zavitz, MD 03/25/16 [phone]

## 2016-03-25 NOTE — ED Notes (Signed)
Delay in antibiotics. Not able to get cultures at this time.

## 2016-03-28 LAB — AEROBIC CULTURE  (SUPERFICIAL SPECIMEN)

## 2016-03-28 LAB — AEROBIC CULTURE W GRAM STAIN (SUPERFICIAL SPECIMEN)

## 2016-03-29 ENCOUNTER — Telehealth (HOSPITAL_BASED_OUTPATIENT_CLINIC_OR_DEPARTMENT_OTHER): Payer: Self-pay

## 2016-03-29 NOTE — Telephone Encounter (Signed)
Post ED Visit - Positive Culture Follow-up  Culture report reviewed by antimicrobial stewardship pharmacist:  []  Enzo BiNathan Batchelder, Pharm.D. []  Celedonio MiyamotoJeremy Frens, Pharm.D., BCPS []  Garvin FilaMike Maccia, Pharm.D. []  Georgina PillionElizabeth Martin, Pharm.D., BCPS []  LostineMinh Pham, [place] BCPS, AAHIVP []  Estella HuskMichelle Turner, Pharm.D., BCPS, AAHIVP []  Tennis Mustassie Stewart, Pharm.D. []  Sherle Poeob Vincent, [place]. Casilda Carlsaylor Stone Pharm D Positive aerobic superficial culture Treated with Sulfamethoxazole-Trimethoprim, organism sensitive to the same and no further patient follow-up is required at this time.  Jerry CarasCullom, Adrin Julian Burnett 03/29/2016, 9:04 AM

## 2016-03-30 LAB — CULTURE, BLOOD (ROUTINE X 2): Culture: NO GROWTH

## 2019-12-19 DIAGNOSIS — Z029 Encounter for administrative examinations, unspecified: Secondary | ICD-10-CM
